# Patient Record
Sex: Female | Born: 1965 | Race: White | Hispanic: No | Marital: Married | Smoking: Current every day smoker
Health system: Southern US, Community
[De-identification: ages and names within clinical notes are randomized; demographics above are authoritative.]

## PROBLEM LIST (undated history)

## (undated) DIAGNOSIS — E785 Hyperlipidemia, unspecified: Secondary | ICD-10-CM

## (undated) DIAGNOSIS — G8929 Other chronic pain: Secondary | ICD-10-CM

## (undated) DIAGNOSIS — I10 Essential (primary) hypertension: Secondary | ICD-10-CM

## (undated) DIAGNOSIS — M549 Dorsalgia, unspecified: Secondary | ICD-10-CM

---

## 2018-07-20 ENCOUNTER — Encounter (HOSPITAL_COMMUNITY): Payer: Self-pay | Admitting: Emergency Medicine

## 2018-07-20 ENCOUNTER — Emergency Department (HOSPITAL_COMMUNITY): Payer: Managed Care, Other (non HMO)

## 2018-07-20 ENCOUNTER — Observation Stay (HOSPITAL_COMMUNITY)
Admission: EM | Admit: 2018-07-20 | Discharge: 2018-07-21 | Disposition: A | Discharge status: Home / Self Care | Payer: Managed Care, Other (non HMO) | Attending: Internal Medicine | Admitting: Internal Medicine

## 2018-07-20 ENCOUNTER — Other Ambulatory Visit: Payer: Self-pay

## 2018-07-20 DIAGNOSIS — G8929 Other chronic pain: Secondary | ICD-10-CM | POA: Diagnosis present

## 2018-07-20 DIAGNOSIS — E876 Hypokalemia: Secondary | ICD-10-CM | POA: Diagnosis not present

## 2018-07-20 DIAGNOSIS — D75839 Thrombocytosis, unspecified: Secondary | ICD-10-CM | POA: Diagnosis present

## 2018-07-20 DIAGNOSIS — Z79891 Long term (current) use of opiate analgesic: Secondary | ICD-10-CM | POA: Insufficient documentation

## 2018-07-20 DIAGNOSIS — D473 Essential (hemorrhagic) thrombocythemia: Secondary | ICD-10-CM | POA: Diagnosis present

## 2018-07-20 DIAGNOSIS — F329 Major depressive disorder, single episode, unspecified: Secondary | ICD-10-CM | POA: Insufficient documentation

## 2018-07-20 DIAGNOSIS — Z791 Long term (current) use of non-steroidal anti-inflammatories (NSAID): Secondary | ICD-10-CM | POA: Diagnosis not present

## 2018-07-20 DIAGNOSIS — R4182 Altered mental status, unspecified: Secondary | ICD-10-CM | POA: Diagnosis not present

## 2018-07-20 DIAGNOSIS — F05 Delirium due to known physiological condition: Secondary | ICD-10-CM

## 2018-07-20 DIAGNOSIS — G92 Toxic encephalopathy: Principal | ICD-10-CM | POA: Insufficient documentation

## 2018-07-20 DIAGNOSIS — F419 Anxiety disorder, unspecified: Secondary | ICD-10-CM | POA: Diagnosis not present

## 2018-07-20 DIAGNOSIS — M549 Dorsalgia, unspecified: Secondary | ICD-10-CM | POA: Insufficient documentation

## 2018-07-20 DIAGNOSIS — T40601A Poisoning by unspecified narcotics, accidental (unintentional), initial encounter: Secondary | ICD-10-CM | POA: Diagnosis not present

## 2018-07-20 DIAGNOSIS — R7989 Other specified abnormal findings of blood chemistry: Secondary | ICD-10-CM | POA: Diagnosis not present

## 2018-07-20 DIAGNOSIS — N179 Acute kidney failure, unspecified: Secondary | ICD-10-CM | POA: Diagnosis present

## 2018-07-20 DIAGNOSIS — I10 Essential (primary) hypertension: Secondary | ICD-10-CM | POA: Diagnosis present

## 2018-07-20 DIAGNOSIS — E785 Hyperlipidemia, unspecified: Secondary | ICD-10-CM | POA: Diagnosis not present

## 2018-07-20 DIAGNOSIS — Z79899 Other long term (current) drug therapy: Secondary | ICD-10-CM | POA: Insufficient documentation

## 2018-07-20 DIAGNOSIS — Z720 Tobacco use: Secondary | ICD-10-CM | POA: Diagnosis not present

## 2018-07-20 HISTORY — DX: Hyperlipidemia, unspecified: E78.5

## 2018-07-20 HISTORY — DX: Other chronic pain: G89.29

## 2018-07-20 HISTORY — DX: Essential (primary) hypertension: I10

## 2018-07-20 HISTORY — DX: Dorsalgia, unspecified: M54.9

## 2018-07-20 LAB — COMPREHENSIVE METABOLIC PANEL
ALT: 13 U/L (ref 0–44)
AST: 18 U/L (ref 15–41)
Albumin: 4.4 g/dL (ref 3.5–5.0)
Alkaline Phosphatase: 107 U/L (ref 38–126)
Anion gap: 12 (ref 5–15)
BUN: 18 mg/dL (ref 6–20)
CO2: 22 mmol/L (ref 22–32)
Calcium: 9.7 mg/dL (ref 8.9–10.3)
Chloride: 104 mmol/L (ref 98–111)
Creatinine, Ser: 2.06 mg/dL — ABNORMAL HIGH (ref 0.44–1.00)
GFR calc Af Amer: 32 mL/min — ABNORMAL LOW (ref >60)
GFR, EST NON AFRICAN AMERICAN: 27 mL/min — AB (ref >60)
Glucose, Bld: 96 mg/dL (ref 70–99)
POTASSIUM: 3.2 mmol/L — AB (ref 3.5–5.1)
Sodium: 138 mmol/L (ref 135–145)
Total Bilirubin: 0.4 mg/dL (ref 0.3–1.2)
Total Protein: 8.3 g/dL — ABNORMAL HIGH (ref 6.5–8.1)

## 2018-07-20 LAB — URINALYSIS, COMPLETE (UACMP) WITH MICROSCOPIC
Glucose, UA: NEGATIVE mg/dL
Hgb urine dipstick: NEGATIVE
Ketones, ur: NEGATIVE mg/dL
Nitrite: NEGATIVE
Protein, ur: 30 mg/dL — AB
SPECIFIC GRAVITY, URINE: 1.027 (ref 1.005–1.030)
pH: 5 (ref 5.0–8.0)

## 2018-07-20 LAB — CBC WITH DIFFERENTIAL/PLATELET
Abs Immature Granulocytes: 0.06 10*3/uL (ref 0.00–0.07)
Abs Immature Granulocytes: 0.03 10*3/uL (ref 0.00–0.07)
BASOS ABS: 0.1 10*3/uL (ref 0.0–0.1)
BASOS PCT: 0 %
Basophils Absolute: 0 10*3/uL (ref 0.0–0.1)
Basophils Relative: 1 %
Eosinophils Absolute: 0 10*3/uL (ref 0.0–0.5)
Eosinophils Absolute: 0.1 10*3/uL (ref 0.0–0.5)
Eosinophils Relative: 0 %
Eosinophils Relative: 1 %
HCT: 38.9 % (ref 36.0–46.0)
HCT: 32.4 % — ABNORMAL LOW (ref 36.0–46.0)
Hemoglobin: 12.3 g/dL (ref 12.0–15.0)
Hemoglobin: 10.3 g/dL — ABNORMAL LOW (ref 12.0–15.0)
IMMATURE GRANULOCYTES: 1 %
Immature Granulocytes: 0 %
Lymphocytes Relative: 22 %
Lymphocytes Relative: 31 %
Lymphs Abs: 2.7 10*3/uL (ref 0.7–4.0)
Lymphs Abs: 3.5 10*3/uL (ref 0.7–4.0)
MCH: 27.8 pg (ref 26.0–34.0)
MCH: 28.1 pg (ref 26.0–34.0)
MCHC: 31.6 g/dL (ref 30.0–36.0)
MCHC: 31.8 g/dL (ref 30.0–36.0)
MCV: 87.8 fL (ref 80.0–100.0)
MCV: 88.5 fL (ref 80.0–100.0)
Monocytes Absolute: 1.2 10*3/uL — ABNORMAL HIGH (ref 0.1–1.0)
Monocytes Absolute: 1.1 10*3/uL — ABNORMAL HIGH (ref 0.1–1.0)
Monocytes Relative: 9 %
Monocytes Relative: 10 %
NRBC: 0 % (ref 0.0–0.2)
Neutro Abs: 8.4 10*3/uL — ABNORMAL HIGH (ref 1.7–7.7)
Neutro Abs: 6.6 10*3/uL (ref 1.7–7.7)
Neutrophils Relative %: 67 %
Neutrophils Relative %: 58 %
Platelets: 588 10*3/uL — ABNORMAL HIGH (ref 150–400)
Platelets: 466 10*3/uL — ABNORMAL HIGH (ref 150–400)
RBC: 4.43 MIL/uL (ref 3.87–5.11)
RBC: 3.66 MIL/uL — ABNORMAL LOW (ref 3.87–5.11)
RDW: 13.3 % (ref 11.5–15.5)
RDW: 13.3 % (ref 11.5–15.5)
WBC: 12.4 10*3/uL — AB (ref 4.0–10.5)
WBC: 11.3 10*3/uL — AB (ref 4.0–10.5)
nRBC: 0 % (ref 0.0–0.2)

## 2018-07-20 LAB — LACTIC ACID, PLASMA
LACTIC ACID, VENOUS: 0.7 mmol/L (ref 0.5–1.9)
Lactic Acid, Venous: 0.9 mmol/L (ref 0.5–1.9)

## 2018-07-20 LAB — MAGNESIUM: Magnesium: 1.6 mg/dL — ABNORMAL LOW (ref 1.7–2.4)

## 2018-07-20 LAB — HCG, SERUM, QUALITATIVE: Preg, Serum: NEGATIVE

## 2018-07-20 LAB — RAPID URINE DRUG SCREEN, HOSP PERFORMED
AMPHETAMINES: NOT DETECTED
BENZODIAZEPINES: NOT DETECTED
Barbiturates: NOT DETECTED
Cocaine: NOT DETECTED
Opiates: POSITIVE — AB
Tetrahydrocannabinol: NOT DETECTED

## 2018-07-20 LAB — INFLUENZA PANEL BY PCR (TYPE A & B)
Influenza A By PCR: NEGATIVE
Influenza B By PCR: NEGATIVE

## 2018-07-20 LAB — TROPONIN I: Troponin I: <0.03 ng/mL (ref <0.03)

## 2018-07-20 LAB — CBG MONITORING, ED: Glucose-Capillary: 101 mg/dL — ABNORMAL HIGH (ref 70–99)

## 2018-07-20 MED ORDER — LORAZEPAM 2 MG/ML IJ SOLN
1.0000 mg | Freq: Once | INTRAMUSCULAR | Status: AC
  Administered 2018-07-20: 1 mg via INTRAVENOUS
  Filled 2018-07-20: qty 1

## 2018-07-20 MED ORDER — ACETAMINOPHEN 325 MG PO TABS: 650.0000 mg | ORAL_TABLET | Freq: Four times a day (QID) | ORAL | Status: DC | PRN

## 2018-07-20 MED ORDER — ACETAMINOPHEN 650 MG RE SUPP: 650.0000 mg | Freq: Four times a day (QID) | RECTAL | Status: DC | PRN

## 2018-07-20 MED ORDER — MAGNESIUM SULFATE 2 GM/50ML IV SOLN
2.0000 g | Freq: Once | INTRAVENOUS | Status: AC
  Administered 2018-07-20: 2 g via INTRAVENOUS
  Filled 2018-07-20: qty 50

## 2018-07-20 MED ORDER — ONDANSETRON HCL 4 MG/2ML IJ SOLN
4.0000 mg | Freq: Once | INTRAMUSCULAR | Status: AC
  Administered 2018-07-20: 4 mg via INTRAVENOUS
  Filled 2018-07-20: qty 2

## 2018-07-20 MED ORDER — HEPARIN SODIUM (PORCINE) 5000 UNIT/ML IJ SOLN
5000.0000 [IU] | Freq: Three times a day (TID) | INTRAMUSCULAR | Status: DC
  Administered 2018-07-20 – 2018-07-21 (×2): 5000 [IU] via SUBCUTANEOUS
  Filled 2018-07-20: qty 1

## 2018-07-20 MED ORDER — ONDANSETRON HCL 4 MG/2ML IJ SOLN: 4.0000 mg | Freq: Four times a day (QID) | INTRAMUSCULAR | Status: DC | PRN

## 2018-07-20 MED ORDER — ONDANSETRON HCL 4 MG PO TABS: 4.0000 mg | ORAL_TABLET | Freq: Four times a day (QID) | ORAL | Status: DC | PRN

## 2018-07-20 MED ORDER — LACTATED RINGERS IV BOLUS
1000.0000 mL | Freq: Once | INTRAVENOUS | Status: AC
  Administered 2018-07-20: 1000 mL via INTRAVENOUS

## 2018-07-20 MED ORDER — SODIUM CHLORIDE 0.9 % IV SOLN
1.0000 g | Freq: Once | INTRAVENOUS | Status: AC
  Administered 2018-07-20: 1 g via INTRAVENOUS
  Filled 2018-07-20: qty 10

## 2018-07-20 MED ORDER — POTASSIUM CHLORIDE 10 MEQ/100ML IV SOLN
10.0000 meq | INTRAVENOUS | Status: DC
  Filled 2018-07-20: qty 100

## 2018-07-20 MED ORDER — POTASSIUM CHLORIDE IN NACL 20-0.9 MEQ/L-% IV SOLN
INTRAVENOUS | Status: DC
  Administered 2018-07-20: 22:00:00 via INTRAVENOUS
  Filled 2018-07-20: qty 1000

## 2018-07-20 NOTE — H&P (Signed)
History and Physical    Allison Berry DVV:[id number] DOB: 06-08-1966 DOA: 07/20/2018  PCP: System, Provider Not In   Patient coming from: Home.  I have personally briefly reviewed patient's old medical records in [place]  Chief Complaint: Altered mental status.  HPI: Allison Berry is a 53 y.o. female with medical history significant of chronic back pain, hyperlipidemia, hypertension, tobacco use who is brought to the emergency department via EMS after her husband found her at home altered.  He states that she has been having nausea, vomiting and diarrhea.  She is somnolent, but wakes up briefly.  She follows simple commands, but does not answer questions.  She is unable to have further history.  Her husband mentioned the last time he saw her at her baseline was yesterday.  He did not see her before going to work in the morning.Her husband states that these symptoms are similar to what she had a couple months ago when she overtook her medications.  However, he is not aware of any recent misuse.  He stated that the pain clinic has been tapering off her total use of opioid analgesics.  ED Course: Initial vital signs temperature 98.2 F, pulse 100, respirations 17, blood pressure 102/66 mmHg and O2 sat 96% on room air.  The patient was given Zofran 4 mg IVP x1, 3000 mL of LR bolus, lorazepam 1 mg IVP x1 and 1 g of Rocephin IVPB.  I added magnesium sulfate 2 g IVPB.  Her urinalysis shows cloudy appearance, small bilirubin, 30 mg/dL of protein, trace leukocyte esterase and rare bacteria microscopic examination.  White count was 12.4, hemoglobin 12.3 g/dL and platelets 588.UDS was positive for opiates.  Serum pregnancy and troponin were negative.  CMP shows a potassium of 3.2 mmol/L.  Creatinine 2.06 mg/dL and total protein 8.3 g/dL.  All other values are within normal limits.  Magnesium was 1.6 mg/dL.  Influenza a and B by PCR was negative.  Lactic acid x2 was negative.  Her CT head was within  normal limits.  Review of Systems: As per HPI otherwise 10 point review of systems negative.   Past Medical History:  Diagnosis Date  . Chronic back pain 07/20/2018  . Dyslipidemia 07/20/2018  . Hypertension 07/20/2018    History reviewed. No pertinent surgical history.   has no history on file for tobacco, alcohol, and drug.  No Known Allergies  Unable to obtain family medical history at this time.  Prior to Admission medications   Medication Sig Start Date End Date Taking? Authorizing Provider  baclofen (LIORESAL) 10 MG tablet Take 10 mg by mouth 2 (two) times daily.   Yes [provider]  DULoxetine (CYMBALTA) 60 MG capsule Take 60 mg by mouth daily.   Yes [provider]  fenofibrate (TRICOR) 145 MG tablet Take 145 mg by mouth daily.   Yes [provider]  ibuprofen (ADVIL,MOTRIN) 800 MG tablet Take 800 mg by mouth every 8 (eight) hours as needed.   Yes [provider]  lisinopril-hydrochlorothiazide (PRINZIDE,ZESTORETIC) 10-12.5 MG tablet Take 1 tablet by mouth daily.   Yes [provider]  oxyCODONE-acetaminophen (PERCOCET) 10-325 MG tablet Take 1 tablet by mouth 4 (four) times daily.    Yes [provider]  potassium chloride SA (K-DUR,KLOR-CON) 20 MEQ tablet Take 20 mEq by mouth daily.   Yes [provider]    Physical Exam: Vitals:   07/20/18 1930 07/20/18 1945 07/20/18 2000 07/20/18 2015  BP: 98/80 (!) 89/54 62/69 Marland Kitchen)  101/54  Pulse: 97 89 92 96  Resp: 13 18 17 18   Temp:      TempSrc:      SpO2: 98% 92% 96% 96%  Weight:      Height:        Constitutional: NAD, calm, comfortable Eyes: PERRL, lids and conjunctivae normal ENMT: Mucous membranes are mildly dry. Posterior pharynx clear of any exudate or lesions. Neck: normal, supple, no masses, no thyromegaly Respiratory: Decreased breath sounds on bases, otherwise clear to auscultation bilaterally, no wheezing, no crackles. Normal respiratory effort. No  accessory muscle use.  Cardiovascular: Regular rate and rhythm, no murmurs / rubs / gallops. No extremity edema. 2+ pedal pulses. No carotid bruits.  Abdomen: Soft, no tenderness, no masses palpated. No hepatosplenomegaly. Bowel sounds positive.  Musculoskeletal: no clubbing / cyanosis. Good ROM, no contractures. Normal muscle tone.  Skin: no rashes, lesions, ulcers on limited dermatological examination. Neurologic: CN 2-12 grossly intact. Sensation intact, DTR normal.  Generalized weakness, but moves all extremities. Psychiatric: Confused.  Alert and oriented to name only.  Labs on Admission: I have personally reviewed following labs and imaging studies  CBC: Recent Labs  Lab 07/20/18 1500  WBC 12.4*  NEUTROABS 8.4*  HGB 12.3  HCT 38.9  MCV 87.8  PLT 353*   Basic Metabolic Panel: Recent Labs  Lab 07/20/18 1500 07/20/18 1745  NA 138  --   K 3.2*  --   CL 104  --   CO2 22  --   GLUCOSE 96  --   BUN 18  --   CREATININE 2.06*  --   CALCIUM 9.7  --   MG  --  1.6*   GFR: Estimated Creatinine Clearance: 30.6 mL/min (A) (by C-G formula based on SCr of 2.06 mg/dL (H)). Liver Function Tests: Recent Labs  Lab 07/20/18 1500  AST 18  ALT 13  ALKPHOS 107  BILITOT 0.4  PROT 8.3*  ALBUMIN 4.4   No results for input(s): LIPASE, AMYLASE in the last 168 hours. No results for input(s): AMMONIA in the last 168 hours. Coagulation Profile: No results for input(s): INR, PROTIME in the last 168 hours. Cardiac Enzymes: Recent Labs  Lab 07/20/18 1500  TROPONINI <0.03   BNP (last 3 results) No results for input(s): PROBNP in the last 8760 hours. HbA1C: No results for input(s): HGBA1C in the last 72 hours. CBG: Recent Labs  Lab 07/20/18 1419  GLUCAP 101*   Lipid Profile: No results for input(s): CHOL, HDL, LDLCALC, TRIG, CHOLHDL, LDLDIRECT in the last 72 hours. Thyroid Function Tests: No results for input(s): TSH, T4TOTAL, FREET4, T3FREE, THYROIDAB in the last 72  hours. Anemia Panel: No results for input(s): VITAMINB12, FOLATE, FERRITIN, TIBC, IRON, RETICCTPCT in the last 72 hours. Urine analysis:    Component Value Date/Time   COLORURINE AMBER (A) 07/20/2018 1413   APPEARANCEUR CLOUDY (A) 07/20/2018 1413   LABSPEC 1.027 07/20/2018 1413   PHURINE 5.0 07/20/2018 1413   GLUCOSEU NEGATIVE 07/20/2018 1413   HGBUR NEGATIVE 07/20/2018 1413   BILIRUBINUR SMALL (A) 07/20/2018 1413   KETONESUR NEGATIVE 07/20/2018 1413   PROTEINUR 30 (A) 07/20/2018 1413   NITRITE NEGATIVE 07/20/2018 1413   LEUKOCYTESUR TRACE (A) 07/20/2018 1413    Radiological Exams on Admission: Ct Head Wo Contrast  Result Date: 07/20/2018 CLINICAL DATA:  Altered mental status with nausea, vomiting and diarrhea. EXAM: CT HEAD WITHOUT CONTRAST TECHNIQUE: Contiguous axial images were obtained from the base of the skull through the vertex without intravenous contrast.  COMPARISON:  None. FINDINGS: Brain: No evidence of acute infarction, hemorrhage, hydrocephalus, extra-axial collection or mass lesion/mass effect. Vascular: No hyperdense vessel or unexpected calcification. Skull: Normal. Negative for fracture or focal lesion. Sinuses/Orbits: Globes and orbits are unremarkable. Visualized sinuses and mastoid air cells are clear. Other: None. IMPRESSION: Normal unenhanced CT scan of the brain. Electronically Signed   By: Lajean Manes M.D.   On: 07/20/2018 14:57    EKG: Independently reviewed. Vent. rate 84 BPM PR interval * ms QRS duration 90 ms QT/QTc 379/448 ms P-R-T axes 57 73 63 Sinus rhythm Abnormal R-wave progression, early transition  Assessment/Plan Principal Problem:   Altered mental status Observation/stepdown.. Continue supplemental oxygen. Continue IV fluids. Neuro checks every 4 hours. Hold sedating medications.  Active Problems:   AKI (acute kidney injury) ([place]) Continue IV fluids. Monitor intake and output. Follow-up BMP.    Hypokalemia Replacing with IV  fluids. Follow-up potassium level.    Hypomagnesemia Replacing. Follow-up magnesium level as needed.    Thrombocytosis Likely due to hemoconcentration. Follow-up platelet level.    Tobacco use Nicotine replacement therapy ordered. Nurse to provide tobacco cessation information.    Hypertension Hold Prinzide. Follow-up BP and renal function.    Chronic back pain Hold opioids and muscle relaxant.    Dyslipidemia Resume TriCor once the patient is more alert.     DVT prophylaxis: Heparin SQ. Code Status: Full code. Family Communication: Her husband was in the ED room and provide a history. Disposition Plan: Observation for IV hydration and close cardio monitoring. Consults called:  Admission status: Observation/SDU.   Reubin Milan MD [facility]  If 7PM-7AM, please contact night-coverage [email or web address] Password [email or web address]  07/20/2018, 8:56 PM

## 2018-07-20 NOTE — ED Provider Notes (Signed)
Emergency Department Provider Note   I have reviewed the triage vital signs and the nursing notes.   HISTORY  Chief Complaint Altered Mental Status   HPI Allison Berry is a 53 y.o. female who is here reportedly for altered mental status.  Patient not able to give me any history although she does keep repeating "no day".  She goes asleep shortly after that.  Not able to offer PMH/PSH/any other history. No family at bedside.  No other associated or modifying symptoms.   LeveL V Caveat 2/2 Altered Mental Status  History reviewed. No pertinent past medical history.  There are no active problems to display for this patient.   Allergies Patient has no known allergies.  No family history on file.  Social History Social History   Tobacco Use  . Smoking status: Not on file  Substance Use Topics  . Alcohol use: Not on file  . Drug use: Not on file    Review of Systems  All other systems negative except as documented in the HPI. All pertinent positives and negatives as reviewed in the HPI. ____________________________________________   PHYSICAL EXAM:  VITAL SIGNS: ED Triage Vitals  Enc Vitals Group     BP 07/20/18 1403 (!) 200/162     Pulse Rate 07/20/18 1403 100     Resp 07/20/18 1400 17     Temp 07/20/18 1403 98.2 F (36.8 C)     Temp Source 07/20/18 1403 Axillary     SpO2 07/20/18 1403 96 %     Weight 07/20/18 1402 150 lb (68 kg)     Height 07/20/18 1402 5\' 4"  (1.626 m)    Constitutional: sleepy. Wakes to pain. Eyes: Conjunctivae are normal. PERRL. EOMI. Head: Atraumatic. Nose: No congestion/rhinnorhea. Mouth/Throat: Mucous membranes are dry.  Oropharynx non-erythematous. Neck: No stridor.  No meningeal signs.   Cardiovascular: Normal rate, regular rhythm. Good peripheral circulation. Grossly normal heart sounds.   Respiratory: Normal respiratory effort.  No retractions. Lungs CTAB. Gastrointestinal: Soft and nontender. No distention.    Musculoskeletal: No lower extremity tenderness nor edema. No gross deformities of extremities. Neurologic:  Only says 'No Day'. No gross focal neurologic deficits are appreciated.  Skin:  Skin is warm, dry and intact. No rash noted.   ____________________________________________   LABS (all labs ordered are listed, but only abnormal results are displayed)  Labs Reviewed  CBG MONITORING, ED - Abnormal; Notable for the following components:      Result Value   Glucose-Capillary 101 (*)    All other components within normal limits  COMPREHENSIVE METABOLIC PANEL  CBC WITH DIFFERENTIAL/PLATELET  URINALYSIS, COMPLETE (UACMP) WITH MICROSCOPIC  TROPONIN I  I-STAT BETA HCG BLOOD, ED ([facility], [facility], [facility] ONLY)   ____________________________________________  EKG   EKG Interpretation  Date/Time:  Thursday July 20 2018 14:15:15 EST Ventricular Rate:  84 PR Interval:    QRS Duration: 90 QT Interval:  379 QTC Calculation: 448 R Axis:   73 Text Interpretation:  Sinus rhythm Abnormal R-wave progression, early transition No old tracing to compare No acute changes Confirmed by Merrily Pew [phone]) on 07/20/2018 2:42:41 PM       ____________________________________________  RADIOLOGY  No results found.  ____________________________________________   PROCEDURES  Procedure(s) performed:   Procedures   ____________________________________________   INITIAL IMPRESSION / ASSESSMENT AND PLAN / ED COURSE  Per nursing, last known normal around 0700 this morning. If this is a stroke, she is outside the window for tPA. Moves all extremities, could be  metabolic. BP high, may be PRES. Will eval for above.   Subsequent BP's lower than first one, it must have been an error, repleted with fluids but patient persistently altered. Husband gave more history that she sometimes abuses pills and this could be related to that. Will d/w medicine re: observation for improved MS.     Pertinent labs &  imaging results that were available during my care of the patient were reviewed by me and considered in my medical decision making (see chart for details).  ____________________________________________  FINAL CLINICAL IMPRESSION(S) / ED DIAGNOSES  Final diagnoses:  None     MEDICATIONS GIVEN DURING THIS VISIT:  Medications  ondansetron (ZOFRAN) injection 4 mg (has no administration in time range)     NEW OUTPATIENT MEDICATIONS STARTED DURING THIS VISIT:  New Prescriptions   No medications on file    Note:  This note was prepared with assistance of Dragon voice recognition software. Occasional wrong-word or sound-a-like substitutions may have occurred due to the inherent limitations of voice recognition software.   Fisher Hargadon, Corene Cornea, MD 07/24/18 [phone]

## 2018-07-20 NOTE — ED Triage Notes (Signed)
Per EMS patient from home. Pts husband called EMS. EMS states patient has been vomiting with diarrhea. Patient awake in triage but unable to follow commands.

## 2018-07-21 ENCOUNTER — Encounter (HOSPITAL_COMMUNITY): Payer: Self-pay

## 2018-07-21 DIAGNOSIS — D75839 Thrombocytosis, unspecified: Secondary | ICD-10-CM | POA: Diagnosis present

## 2018-07-21 DIAGNOSIS — R4182 Altered mental status, unspecified: Secondary | ICD-10-CM | POA: Diagnosis not present

## 2018-07-21 DIAGNOSIS — D473 Essential (hemorrhagic) thrombocythemia: Secondary | ICD-10-CM | POA: Diagnosis present

## 2018-07-21 LAB — BASIC METABOLIC PANEL
Anion gap: 10 (ref 5–15)
BUN: 12 mg/dL (ref 6–20)
CO2: 21 mmol/L — ABNORMAL LOW (ref 22–32)
Calcium: 8.7 mg/dL — ABNORMAL LOW (ref 8.9–10.3)
Chloride: 110 mmol/L (ref 98–111)
Creatinine, Ser: 0.85 mg/dL (ref 0.44–1.00)
GFR calc Af Amer: >60 mL/min (ref >60)
GFR calc non Af Amer: >60 mL/min (ref >60)
Glucose, Bld: 87 mg/dL (ref 70–99)
Potassium: 3.2 mmol/L — ABNORMAL LOW (ref 3.5–5.1)
Sodium: 141 mmol/L (ref 135–145)

## 2018-07-21 LAB — MRSA PCR SCREENING: MRSA by PCR: NEGATIVE

## 2018-07-21 MED ORDER — NICOTINE 21 MG/24HR TD PT24: 21.0000 mg | MEDICATED_PATCH | Freq: Every day | TRANSDERMAL | Status: DC | PRN

## 2018-07-21 MED ORDER — POTASSIUM CHLORIDE CRYS ER 20 MEQ PO TBCR
40.0000 meq | EXTENDED_RELEASE_TABLET | Freq: Once | ORAL | Status: AC
  Administered 2018-07-21: 40 meq via ORAL
  Filled 2018-07-21: qty 2

## 2018-07-21 MED ORDER — LORAZEPAM 2 MG/ML IJ SOLN: 0.5000 mg | INTRAMUSCULAR | Status: DC | PRN

## 2018-07-21 MED ORDER — LACTATED RINGERS IV SOLN
INTRAVENOUS | Status: DC
  Administered 2018-07-21: 08:00:00 via INTRAVENOUS

## 2018-07-21 MED ORDER — BISACODYL 10 MG RE SUPP
10.0000 mg | Freq: Once | RECTAL | Status: AC
  Administered 2018-07-21: 10 mg via RECTAL
  Filled 2018-07-21: qty 1

## 2018-07-21 MED ORDER — LORAZEPAM 2 MG/ML IJ SOLN
1.0000 mg | Freq: Once | INTRAMUSCULAR | Status: AC
  Administered 2018-07-21: 1 mg via INTRAVENOUS
  Filled 2018-07-21: qty 1

## 2018-07-21 NOTE — Clinical Social Work Note (Signed)
Met with patient and gave addiction resources per Dr request.  Pt is ambivalent about change, motivation low.

## 2018-07-21 NOTE — Progress Notes (Signed)
PROGRESS NOTE    Allison Berry  HTD:[id number] DOB: August 25, 1965 DOA: 07/20/2018 PCP: System, Provider Not In   Brief Narrative:  Per HPI: Allison Berry is a 53 y.o. female with medical history significant of chronic back pain, hyperlipidemia, hypertension, tobacco use who is brought to the emergency department via EMS after her husband found her at home altered.  He states that she has been having nausea, vomiting and diarrhea.  She is somnolent, but wakes up briefly.  She follows simple commands, but does not answer questions.  She is unable to have further history.  Her husband mentioned the last time he saw her at her baseline was yesterday.  He did not see her before going to work in the morning.Her husband states that these symptoms are similar to what she had a couple months ago when she overtook her medications.  However, he is not aware of any recent misuse.  He stated that the pain clinic has been tapering off her total use of opioid analgesics.  Patient was admitted with acute metabolic encephalopathy due to suspected overuse of her narcotic medications in the face of AKI.  Assessment & Plan:   Principal Problem:   Altered mental status Active Problems:   AKI (acute kidney injury) ([place])   Hypertension   Chronic back pain   Dyslipidemia   Hypokalemia   Hypomagnesemia   Hypokalemia   Thrombocytosis ([facility])  1. Acute metabolic/toxic encephalopathy.  This appears to be a combination of likely medication overdose with narcotics and baclofen in the setting of AKI.  It appears that patient may have been weaned from her medications fairly quickly began to have opioid withdrawal symptoms to the point where she likely overdosed creating her symptomatology.  Continue to monitor off of these medications and maintain on IV fluid.  Appropriate for transfer to general medical floor at this time. 2. AKI-likely secondary to dehydration.  Patient did have some diarrhea, nausea and vomiting likely  secondary to opioid withdrawal symptoms.  Continue on IV fluid and monitor I's and O's and follow-up BMP in a.m.  Already appears to be improving with hydration. 3. Hypokalemia-likely secondary to diarrhea.  Was receiving IV fluid replacement.  Will maintain on oral replacement at this time and recheck labs in a.m. along with magnesium which was also low. 4. Chronic back pain.  Hold Percocet and baclofen.  Hold ibuprofen due to AKI. 5. Depression/anxiety.  Continue to hold Cymbalta at this time.  Will reinitiate on discharge once improved. 6. Dyslipidemia.  Continue on TriCor. 7. Hypertension.  Continue to hold lisinopril/HCTZ due to soft blood pressure readings and AKI.  Will reinitiate on discharge once improved.   DVT prophylaxis: Heparin Code Status: Full Family Communication: None currently at bedside Disposition Plan: Transfer to floor today and continue monitor with electrolyte replacement.  Anticipate discharge in next 24 hours if she continues to remain improved.   Consultants:   None  Procedures:   None  Antimicrobials:   Rocephin 1/2-1/2   Subjective: Patient seen and evaluated today with no new acute complaints or concerns. No acute concerns or events noted overnight.  She complains of some constipation and dry mouth this morning, but is more awake and alert otherwise.  She still appears confused.  Objective: Vitals:   07/21/18 0300 07/21/18 0400 07/21/18 0500 07/21/18 0600  BP: (!) 96/59 (!) 98/59 (!) 104/57 (!) 104/54  Pulse: 90 85 93 90  Resp: 15 17 16 18   Temp:  98.3 F (36.8 C)  TempSrc:  Axillary    SpO2: 95% 97% 99% 97%  Weight:   66.3 kg   Height:        Intake/Output Summary (Last 24 hours) at 07/21/2018 0729 Last data filed at 07/21/2018 0500 Gross per 24 hour  Intake 3607.43 ml  Output 300 ml  Net 3307.43 ml   Filed Weights   07/20/18 1402 07/20/18 2352 07/21/18 0500  Weight: 68 kg 66.3 kg 66.3 kg    Examination:  General exam: Appears  calm and comfortable, overall confused and disoriented. Respiratory system: Clear to auscultation. Respiratory effort normal. Cardiovascular system: S1 & S2 heard, RRR. No JVD, murmurs, rubs, gallops or clicks. No pedal edema. Gastrointestinal system: Abdomen is nondistended, soft and nontender. No organomegaly or masses felt. Normal bowel sounds heard. Central nervous system: Alert and awake Extremities: Symmetric 5 x 5 power. Skin: No rashes, lesions or ulcers Psychiatry: Cannot fully assess at this time.    Data Reviewed: I have personally reviewed following labs and imaging studies  CBC: Recent Labs  Lab 07/20/18 1500 07/20/18 2056  WBC 12.4* 11.3*  NEUTROABS 8.4* 6.6  HGB 12.3 10.3*  HCT 38.9 32.4*  MCV 87.8 88.5  PLT 588* 623*   Basic Metabolic Panel: Recent Labs  Lab 07/20/18 1500 07/20/18 1745 07/21/18 0425  NA 138  --  141  K 3.2*  --  3.2*  CL 104  --  110  CO2 22  --  21*  GLUCOSE 96  --  87  BUN 18  --  12  CREATININE 2.06*  --  0.85  CALCIUM 9.7  --  8.7*  MG  --  1.6*  --    GFR: Estimated Creatinine Clearance: 73.3 mL/min (by C-G formula based on SCr of 0.85 mg/dL). Liver Function Tests: Recent Labs  Lab 07/20/18 1500  AST 18  ALT 13  ALKPHOS 107  BILITOT 0.4  PROT 8.3*  ALBUMIN 4.4   No results for input(s): LIPASE, AMYLASE in the last 168 hours. No results for input(s): AMMONIA in the last 168 hours. Coagulation Profile: No results for input(s): INR, PROTIME in the last 168 hours. Cardiac Enzymes: Recent Labs  Lab 07/20/18 1500  TROPONINI <0.03   BNP (last 3 results) No results for input(s): PROBNP in the last 8760 hours. HbA1C: No results for input(s): HGBA1C in the last 72 hours. CBG: Recent Labs  Lab 07/20/18 1419  GLUCAP 101*   Lipid Profile: No results for input(s): CHOL, HDL, LDLCALC, TRIG, CHOLHDL, LDLDIRECT in the last 72 hours. Thyroid Function Tests: No results for input(s): TSH, T4TOTAL, FREET4, T3FREE, THYROIDAB  in the last 72 hours. Anemia Panel: No results for input(s): VITAMINB12, FOLATE, FERRITIN, TIBC, IRON, RETICCTPCT in the last 72 hours. Sepsis Labs: Recent Labs  Lab 07/20/18 1745 07/20/18 1953  LATICACIDVEN 0.9 0.7    Recent Results (from the past 240 hour(s))  MRSA PCR Screening     Status: None   Collection Time: 07/20/18 11:24 PM  Result Value Ref Range Status   MRSA by PCR NEGATIVE NEGATIVE Final    Comment:        The GeneXpert MRSA Assay (FDA approved for NASAL specimens only), is one component of a comprehensive MRSA colonization surveillance program. It is not intended to diagnose MRSA infection nor to guide or monitor treatment for MRSA infections. Performed at [facility], [address], [place], [place] [address]          Radiology Studies: Ct Head Wo Contrast  Result Date: 07/20/2018 CLINICAL DATA:  Altered mental status with nausea, vomiting and diarrhea. EXAM: CT HEAD WITHOUT CONTRAST TECHNIQUE: Contiguous axial images were obtained from the base of the skull through the vertex without intravenous contrast. COMPARISON:  None. FINDINGS: Brain: No evidence of acute infarction, hemorrhage, hydrocephalus, extra-axial collection or mass lesion/mass effect. Vascular: No hyperdense vessel or unexpected calcification. Skull: Normal. Negative for fracture or focal lesion. Sinuses/Orbits: Globes and orbits are unremarkable. Visualized sinuses and mastoid air cells are clear. Other: None. IMPRESSION: Normal unenhanced CT scan of the brain. Electronically Signed   By: Lajean Manes M.D.   On: 07/20/2018 14:57        Scheduled Meds: . bisacodyl  10 mg Rectal Once  . heparin  5,000 Units Subcutaneous Q8H  . potassium chloride  40 mEq Oral Once   Continuous Infusions: . lactated ringers       LOS: 0 days    Time spent: 30 minutes    Sharece Fleischhacker Darleen Crocker, DO [facility] Pager [phone]  If 7PM-7AM, please contact  night-coverage [email or web address] Password [facility] 07/21/2018, 7:29 AM

## 2018-07-21 NOTE — Progress Notes (Addendum)
Pt is awake and disoriented. She is intermittently tearful but cannot describe why. She states that she doesn't know why she is so restless She denies pain. She denies any attempt to harm herself prior to arrival in the ER.  Currently she is restless but can converse a small amount although usually inappropriate responses.Marland Kitchen

## 2018-07-21 NOTE — Progress Notes (Signed)
Patient is extremely restless and refuses to answer some questions. She cannot answer what medications she takes or has/has not taken. She says "no"when asked if she remembers the events preceding her admission.

## 2018-07-21 NOTE — Progress Notes (Signed)
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 This RN spoke with the patient about the possibility of her having taken too much medication, to which the patient adamantly states that she does not take more than what she is prescribed.   MD made aware of this conversation.  Celestia Khat, RN

## 2018-07-21 NOTE — Progress Notes (Signed)
Patient is to be discharged home and in stable condition. Patient's IV removed, WNL. Patient and husband given discharge instructions and verbalized understanding. Patient given addiction resources by [facility] [employer] with CSW. Patient accompanied to awaiting transportation by staff via wheelchair.  Celestia Khat, RN

## 2018-07-21 NOTE — Discharge Summary (Signed)
Physician Discharge Summary  Allison Berry OBS:[id number] DOB: [birth year] DOA: 07/20/2018  PCP: System, Provider Not In  Admit date: 07/20/2018  Discharge date: 07/21/2018  Admitted From:Home  Disposition:  Home  Recommendations for Outpatient Follow-up:  1. Follow up with PCP in 1-2 weeks 2. Continue medications as prescribed 3. Patient has been given information on addiction counseling/rehabilitation centers prior to discharge  Home Health: None  Equipment/Devices: None  Discharge Condition: Stable  CODE STATUS: Full  Diet recommendation: Heart Healthy  Brief/Interim Summary: Per HPI: Allison Berry a 53 y.o.femalewith medical history significant ofchronic back pain, hyperlipidemia, hypertension, tobacco use who is brought to the emergency department via EMS after her husband found her at home altered. He states that she has been having nausea, vomiting and diarrhea. She is somnolent, but wakes up briefly. She follows simple commands, but does not answer questions. She is unable to have further history. Her husband mentioned the last time he saw her at her baseline was yesterday. He did not see her before going to work in the morning.Her husband states that thesesymptoms are similar to what she had a couple months ago when she overtook her medications. However, he is not aware of any recent misuse. He stated that the pain clinic has been tapering off her total use of opioid analgesics.  Patient was admitted with acute metabolic encephalopathy secondary to overdose on her narcotic medications due to "excessive pain."  She denies any suicidal intention or intends to harm herself.  She did have some AKI which has now resolved with IV fluid administration.  She is now back to her usual functioning baseline per husband at baseline and is eating well and did have a bowel movement.  She is agreeable to attend some addiction counseling and understands that she has a problem with  overusing her pain medications.  She will not get any further prescriptions for this on discharge and will need to contact her pain management specialist.  No other acute events noted during the course of this admission.  Discharge Diagnoses:  Principal Problem:   Altered mental status Active Problems:   AKI (acute kidney injury) ([place])   Hypertension   Chronic back pain   Dyslipidemia   Hypokalemia   Hypomagnesemia   Hypokalemia   Thrombocytosis (HCC)  Principal discharge diagnosis: Acute toxic encephalopathy secondary to overdose of narcotic medications and AKI.  Discharge Instructions  Discharge Instructions    Diet - low sodium heart healthy   Complete by:  As directed    Increase activity slowly   Complete by:  As directed      Allergies as of 07/21/2018   No Known Allergies     Medication List    TAKE these medications   baclofen 10 MG tablet Commonly known as:  LIORESAL Take 10 mg by mouth 2 (two) times daily.   DULoxetine 60 MG capsule Commonly known as:  CYMBALTA Take 60 mg by mouth daily.   fenofibrate 145 MG tablet Commonly known as:  TRICOR Take 145 mg by mouth daily.   ibuprofen 800 MG tablet Commonly known as:  ADVIL,MOTRIN Take 800 mg by mouth every 8 (eight) hours as needed.   lisinopril-hydrochlorothiazide 10-12.5 MG tablet Commonly known as:  PRINZIDE,ZESTORETIC Take 1 tablet by mouth daily.   oxyCODONE-acetaminophen 10-325 MG tablet Commonly known as:  PERCOCET Take 1 tablet by mouth 4 (four) times daily.   potassium chloride SA 20 MEQ tablet Commonly known as:  K-DUR,KLOR-CON Take 20 mEq by mouth daily.  Follow-up Information    pcp and pain management. Schedule an appointment as soon as possible for a visit in 1 week(s).          No Known Allergies  Consultations:  None   Procedures/Studies: Ct Head Wo Contrast  Result Date: 07/20/2018 CLINICAL DATA:  Altered mental status with nausea, vomiting and diarrhea. EXAM: CT  HEAD WITHOUT CONTRAST TECHNIQUE: Contiguous axial images were obtained from the base of the skull through the vertex without intravenous contrast. COMPARISON:  None. FINDINGS: Brain: No evidence of acute infarction, hemorrhage, hydrocephalus, extra-axial collection or mass lesion/mass effect. Vascular: No hyperdense vessel or unexpected calcification. Skull: Normal. Negative for fracture or focal lesion. Sinuses/Orbits: Globes and orbits are unremarkable. Visualized sinuses and mastoid air cells are clear. Other: None. IMPRESSION: Normal unenhanced CT scan of the brain. Electronically Signed   By: Lajean Manes M.D.   On: 07/20/2018 14:57     Discharge Exam: Vitals:   07/21/18 1000 07/21/18 1100  BP: (!) 97/58 101/67  Pulse: (!) 107 93  Resp: 17 16  Temp:    SpO2: 90% 96%   Vitals:   07/21/18 0800 07/21/18 0900 07/21/18 1000 07/21/18 1100  BP:  98/81 (!) 97/58 101/67  Pulse:  (!) 101 (!) 107 93  Resp:  19 17 16   Temp: 98.4 F (36.9 C)     TempSrc: Oral     SpO2:  95% 90% 96%  Weight:      Height:        General: Pt is alert, awake, not in acute distress Cardiovascular: RRR, S1/S2 +, no rubs, no gallops Respiratory: CTA bilaterally, no wheezing, no rhonchi Abdominal: Soft, NT, ND, bowel sounds + Extremities: no edema, no cyanosis    The results of significant diagnostics from this hospitalization (including imaging, microbiology, ancillary and laboratory) are listed below for reference.     Microbiology: Recent Results (from the past 240 hour(s))  MRSA PCR Screening     Status: None   Collection Time: 07/20/18 11:24 PM  Result Value Ref Range Status   MRSA by PCR NEGATIVE NEGATIVE Final    Comment:        The GeneXpert MRSA Assay (FDA approved for NASAL specimens only), is one component of a comprehensive MRSA colonization surveillance program. It is not intended to diagnose MRSA infection nor to guide or monitor treatment for MRSA infections. Performed at [facility], [address], [place], [place] [address]      Labs: BNP (last 3 results) No results for input(s): BNP in the last 8760 hours. Basic Metabolic Panel: Recent Labs  Lab 07/20/18 1500 07/20/18 1745 07/21/18 0425  NA 138  --  141  K 3.2*  --  3.2*  CL 104  --  110  CO2 22  --  21*  GLUCOSE 96  --  87  BUN 18  --  12  CREATININE 2.06*  --  0.85  CALCIUM 9.7  --  8.7*  MG  --  1.6*  --    Liver Function Tests: Recent Labs  Lab 07/20/18 1500  AST 18  ALT 13  ALKPHOS 107  BILITOT 0.4  PROT 8.3*  ALBUMIN 4.4   No results for input(s): LIPASE, AMYLASE in the last 168 hours. No results for input(s): AMMONIA in the last 168 hours. CBC: Recent Labs  Lab 07/20/18 1500 07/20/18 2056  WBC 12.4* 11.3*  NEUTROABS 8.4* 6.6  HGB 12.3 10.3*  HCT 38.9 32.4*  MCV 87.8 88.5  PLT 588* 466*   Cardiac Enzymes: Recent Labs  Lab 07/20/18 1500  TROPONINI <0.03   BNP: Invalid input(s): POCBNP CBG: Recent Labs  Lab 07/20/18 1419  GLUCAP 101*   D-Dimer No results for input(s): DDIMER in the last 72 hours. Hgb A1c No results for input(s): HGBA1C in the last 72 hours. Lipid Profile No results for input(s): CHOL, HDL, LDLCALC, TRIG, CHOLHDL, LDLDIRECT in the last 72 hours. Thyroid function studies No results for input(s): TSH, T4TOTAL, T3FREE, THYROIDAB in the last 72 hours.  Invalid input(s): FREET3 Anemia work up No results for input(s): VITAMINB12, FOLATE, FERRITIN, TIBC, IRON, RETICCTPCT in the last 72 hours. Urinalysis    Component Value Date/Time   COLORURINE AMBER (A) 07/20/2018 1413   APPEARANCEUR CLOUDY (A) 07/20/2018 1413   LABSPEC 1.027 07/20/2018 1413   PHURINE 5.0 07/20/2018 1413   GLUCOSEU NEGATIVE 07/20/2018 1413   HGBUR NEGATIVE 07/20/2018 1413   BILIRUBINUR SMALL (A) 07/20/2018 1413   KETONESUR NEGATIVE 07/20/2018 1413   PROTEINUR 30 (A) 07/20/2018 1413   NITRITE NEGATIVE 07/20/2018 1413   LEUKOCYTESUR TRACE (A) 07/20/2018 1413   Sepsis  Labs Invalid input(s): PROCALCITONIN,  WBC,  LACTICIDVEN Microbiology Recent Results (from the past 240 hour(s))  MRSA PCR Screening     Status: None   Collection Time: 07/20/18 11:24 PM  Result Value Ref Range Status   MRSA by PCR NEGATIVE NEGATIVE Final    Comment:        The GeneXpert MRSA Assay (FDA approved for NASAL specimens only), is one component of a comprehensive MRSA colonization surveillance program. It is not intended to diagnose MRSA infection nor to guide or monitor treatment for MRSA infections. Performed at [facility], [address] [place], [place] [address]      Time coordinating discharge: 35 minutes  SIGNED:   Rodena Goldmann, DO [facility] 07/21/2018, 11:18 AM Pager [phone]  If 7PM-7AM, please contact night-coverage [email or web address] Password [email or web address]

## 2018-07-22 LAB — HIV ANTIBODY (ROUTINE TESTING W REFLEX): HIV Screen 4th Generation wRfx: NONREACTIVE

## 2020-10-12 ENCOUNTER — Other Ambulatory Visit: Payer: Self-pay

## 2020-10-12 ENCOUNTER — Inpatient Hospital Stay (HOSPITAL_COMMUNITY)
Admission: EM | Admit: 2020-10-12 | Discharge: 2020-10-19 | DRG: 522 | Disposition: A | Discharge status: Home Health | Payer: Medicaid Other | Attending: Internal Medicine | Admitting: Internal Medicine

## 2020-10-12 ENCOUNTER — Encounter (HOSPITAL_COMMUNITY): Payer: Self-pay

## 2020-10-12 ENCOUNTER — Emergency Department (HOSPITAL_COMMUNITY): Payer: Medicaid Other

## 2020-10-12 DIAGNOSIS — W19XXXA Unspecified fall, initial encounter: Secondary | ICD-10-CM

## 2020-10-12 DIAGNOSIS — M549 Dorsalgia, unspecified: Secondary | ICD-10-CM | POA: Diagnosis present

## 2020-10-12 DIAGNOSIS — F419 Anxiety disorder, unspecified: Secondary | ICD-10-CM | POA: Diagnosis present

## 2020-10-12 DIAGNOSIS — D75839 Thrombocytosis, unspecified: Secondary | ICD-10-CM | POA: Diagnosis present

## 2020-10-12 DIAGNOSIS — I10 Essential (primary) hypertension: Secondary | ICD-10-CM | POA: Diagnosis present

## 2020-10-12 DIAGNOSIS — F32A Depression, unspecified: Secondary | ICD-10-CM | POA: Diagnosis present

## 2020-10-12 DIAGNOSIS — D62 Acute posthemorrhagic anemia: Secondary | ICD-10-CM | POA: Diagnosis not present

## 2020-10-12 DIAGNOSIS — S72002A Fracture of unspecified part of neck of left femur, initial encounter for closed fracture: Secondary | ICD-10-CM

## 2020-10-12 DIAGNOSIS — F17210 Nicotine dependence, cigarettes, uncomplicated: Secondary | ICD-10-CM | POA: Diagnosis present

## 2020-10-12 DIAGNOSIS — Z96649 Presence of unspecified artificial hip joint: Secondary | ICD-10-CM

## 2020-10-12 DIAGNOSIS — W010XXA Fall on same level from slipping, tripping and stumbling without subsequent striking against object, initial encounter: Secondary | ICD-10-CM | POA: Diagnosis present

## 2020-10-12 DIAGNOSIS — Z79899 Other long term (current) drug therapy: Secondary | ICD-10-CM

## 2020-10-12 DIAGNOSIS — Y92019 Unspecified place in single-family (private) house as the place of occurrence of the external cause: Secondary | ICD-10-CM

## 2020-10-12 DIAGNOSIS — E876 Hypokalemia: Secondary | ICD-10-CM | POA: Diagnosis present

## 2020-10-12 DIAGNOSIS — Z419 Encounter for procedure for purposes other than remedying health state, unspecified: Secondary | ICD-10-CM

## 2020-10-12 DIAGNOSIS — Z79891 Long term (current) use of opiate analgesic: Secondary | ICD-10-CM

## 2020-10-12 DIAGNOSIS — Z20822 Contact with and (suspected) exposure to covid-19: Secondary | ICD-10-CM | POA: Diagnosis present

## 2020-10-12 DIAGNOSIS — S72012A Unspecified intracapsular fracture of left femur, initial encounter for closed fracture: Principal | ICD-10-CM | POA: Diagnosis present

## 2020-10-12 DIAGNOSIS — D72829 Elevated white blood cell count, unspecified: Secondary | ICD-10-CM

## 2020-10-12 DIAGNOSIS — E785 Hyperlipidemia, unspecified: Secondary | ICD-10-CM | POA: Diagnosis present

## 2020-10-12 DIAGNOSIS — G8929 Other chronic pain: Secondary | ICD-10-CM | POA: Diagnosis present

## 2020-10-12 DIAGNOSIS — D649 Anemia, unspecified: Secondary | ICD-10-CM

## 2020-10-12 MED ORDER — OXYCODONE-ACETAMINOPHEN 5-325 MG PO TABS
1.0000 | ORAL_TABLET | Freq: Once | ORAL | Status: AC
  Administered 2020-10-12: 1 via ORAL
  Filled 2020-10-12: qty 1

## 2020-10-12 NOTE — ED Triage Notes (Signed)
BIB EMS after mechanical fall Saturday morning. C/o L hip pain at this time.

## 2020-10-12 NOTE — ED Provider Notes (Addendum)
[facility] EMERGENCY DEPARTMENT Provider Note   CSN: [id number] Arrival date & time: 10/12/20  2319     History Chief Complaint  Patient presents with  . Fall    Allison Berry is a 55 y.o. female.  Patient presents to the emergency department for evaluation of injuries after a fall.  Patient reports falling onto her left side, injuring her left pelvic area.  She reports that the fall occurred yesterday.  She fell into a pile of laundry, did not hit her head but did injure the left hip.  Patient reports pain in the groin and behind the left hip, especially with movement.         Past Medical History:  Diagnosis Date  . Chronic back pain 07/20/2018  . Dyslipidemia 07/20/2018  . Hypertension 07/20/2018    Patient Active Problem List   Diagnosis Date Noted  . Thrombocytosis 07/21/2018  . Altered mental status 07/20/2018  . AKI (acute kidney injury) ([place]) 07/20/2018  . Hypertension 07/20/2018  . Chronic back pain 07/20/2018  . Dyslipidemia 07/20/2018  . Hypokalemia 07/20/2018  . Hypomagnesemia 07/20/2018  . Hypokalemia 07/20/2018    History reviewed. No pertinent surgical history.   OB History   No obstetric history on file.     History reviewed. No pertinent family history.  Social History   Tobacco Use  . Smoking status: Current Every Day Smoker    Packs/day: 0.25    Types: Cigarettes  . Smokeless tobacco: Never Used    Home Medications Prior to Admission medications   Medication Sig Start Date End Date Taking? Authorizing Provider  baclofen (LIORESAL) 10 MG tablet Take 10 mg by mouth 2 (two) times daily.    [provider]  DULoxetine (CYMBALTA) 60 MG capsule Take 60 mg by mouth daily.    [provider]  fenofibrate (TRICOR) 145 MG tablet Take 145 mg by mouth daily.    [provider]  ibuprofen (ADVIL,MOTRIN) 800 MG tablet Take 800 mg by mouth every 8 (eight) hours as needed.    [provider]   lisinopril-hydrochlorothiazide (PRINZIDE,ZESTORETIC) 10-12.5 MG tablet Take 1 tablet by mouth daily.    [provider]  oxyCODONE-acetaminophen (PERCOCET) 10-325 MG tablet Take 1 tablet by mouth 4 (four) times daily.     [provider]  potassium chloride SA (K-DUR,KLOR-CON) 20 MEQ tablet Take 20 mEq by mouth daily.    [provider]    Allergies    Patient has no known allergies.  Review of Systems   Review of Systems  Musculoskeletal: Positive for arthralgias.  All other systems reviewed and are negative.   Physical Exam Updated Vital Signs BP (!) 151/96   Pulse (!) 111   Temp 98.5 F (36.9 C)   Resp 18   Ht 5\' 6"  (1.676 m)   Wt 64.4 kg   SpO2 100%   BMI 22.92 kg/m   Physical Exam Vitals and nursing note reviewed.  Constitutional:      General: She is not in acute distress.    Appearance: Normal appearance. She is well-developed.  HENT:     Head: Normocephalic and atraumatic.     Right Ear: Hearing normal.     Left Ear: Hearing normal.     Nose: Nose normal.  Eyes:     Conjunctiva/sclera: Conjunctivae normal.     Pupils: Pupils are equal, round, and reactive to light.  Cardiovascular:     Rate and Rhythm: Regular rhythm.     Heart  sounds: S1 normal and S2 normal. No murmur heard. No friction rub. No gallop.   Pulmonary:     Effort: Pulmonary effort is normal. No respiratory distress.     Breath sounds: Normal breath sounds.  Chest:     Chest wall: No tenderness.  Abdominal:     General: Bowel sounds are normal.     Palpations: Abdomen is soft.     Tenderness: There is no abdominal tenderness. There is no guarding or rebound. Negative signs include Murphy's sign and McBurney's sign.     Hernia: No hernia is present.  Musculoskeletal:     Cervical back: Normal range of motion and neck supple.     Left hip: Tenderness present. No deformity, lacerations or crepitus. Decreased range of motion.  Skin:    General: Skin is warm and  dry.     Findings: No rash.  Neurological:     Mental Status: She is alert and oriented to person, place, and time.     GCS: GCS eye subscore is 4. GCS verbal subscore is 5. GCS motor subscore is 6.     Cranial Nerves: No cranial nerve deficit.     Sensory: No sensory deficit.     Coordination: Coordination normal.  Psychiatric:        Speech: Speech normal.        Behavior: Behavior normal.        Thought Content: Thought content normal.     ED Results / Procedures / Treatments   Labs (all labs ordered are listed, but only abnormal results are displayed) Labs Reviewed  CBC WITH DIFFERENTIAL/PLATELET - Abnormal; Notable for the following components:      Result Value   WBC 11.3 (*)    Hemoglobin 11.6 (*)    HCT 35.6 (*)    Platelets 549 (*)    Neutro Abs 8.6 (*)    All other components within normal limits  BASIC METABOLIC PANEL - Abnormal; Notable for the following components:   Potassium 2.8 (*)    Glucose, Bld 116 (*)    All other components within normal limits  RESP PANEL BY RT-PCR (FLU A&B, COVID) [id number]  PROTIME-INR    EKG EKG Interpretation  Date/Time:  Monday October 13 2020 00:11:59 EDT Ventricular Rate:  98 PR Interval:    QRS Duration: 85 QT Interval:  330 QTC Calculation: 422 R Axis:   63 Text Interpretation: Sinus rhythm Abnormal R-wave progression, early transition Nonspecific T abnormalities, lateral leads Confirmed by Orpah Greek [phone]) on 10/13/2020 12:32:54 AM   Radiology DG Chest 1 View  Result Date: 10/13/2020 CLINICAL DATA:  Left hip fracture EXAM: CHEST  1 VIEW COMPARISON:  None. FINDINGS: The heart size and mediastinal contours are within normal limits. Both lungs are clear. The visualized skeletal structures are unremarkable. IMPRESSION: No active disease. Electronically Signed   By: Fidela Salisbury MD   On: 10/13/2020 00:14   DG Hip Unilat W or Wo Pelvis 2-3 Views Left  Result Date: 10/13/2020 CLINICAL DATA:  Fall, left hip pain  EXAM: DG HIP (WITH OR WITHOUT PELVIS) 2-3V LEFT COMPARISON:  None. FINDINGS: Single view radiograph of the pelvis and two view radiograph of the left hip demonstrate an acute left subcapital femoral neck fracture with superolateral displacement and external rotation of the distal fracture fragment. The femoral head is still seated within the left acetabulum. Left hip joint space appears preserved. Limited evaluation of the right hip is unremarkable. Lumbosacral fusion with instrumentation has  been performed, not well assessed. IMPRESSION: Acute, displaced, rotated left subcapital femoral neck fracture. Electronically Signed   By: Fidela Salisbury MD   On: 10/13/2020 00:13    Procedures Procedures   Medications Ordered in ED Medications  oxyCODONE-acetaminophen (PERCOCET/ROXICET) 5-325 MG per tablet 1 tablet (1 tablet Oral Given 10/12/20 2340)    ED Course  I have reviewed the triage vital signs and the nursing notes.  Pertinent labs & imaging results that were available during my care of the patient were reviewed by me and considered in my medical decision making (see chart for details).    MDM Rules/Calculators/A&P                          Patient presents to the emergency department for evaluation of left hip pain after a fall.  Patient suffered a ground-level mechanical fall with isolated left hip pain after the fall.  X-ray shows subcapital femoral neck fracture.  Will be admitted for surgical intervention.  Discussed with Dr. Erlinda Hong, keep n.p.o. will consult on patient for surgical intervention.  Addendum: Received call from Dr. Erlinda Hong, requesting exploring the possibility of keeping the patient at [facility] because of lack of beds at [facility].  I did contact Dr. Aline Brochure who is on-call for Ortho here at [facility] after 7 AM.  We discussed the nature of the fracture and he felt that the patient would require a total hip replacement and that she would need to go to [facility] for that procedure.   Continue with original plan.  Final Clinical Impression(s) / ED Diagnoses Final diagnoses:  Closed fracture of left hip, initial encounter [facility])    Rx / DC Orders ED Discharge Orders    None       Aracelys Glade, Gwenyth Allegra, MD 10/13/20 7829    Orpah Greek, MD 10/13/20 0700

## 2020-10-13 ENCOUNTER — Inpatient Hospital Stay (HOSPITAL_COMMUNITY): Payer: Medicaid Other

## 2020-10-13 DIAGNOSIS — Z20822 Contact with and (suspected) exposure to covid-19: Secondary | ICD-10-CM | POA: Diagnosis present

## 2020-10-13 DIAGNOSIS — D75839 Thrombocytosis, unspecified: Secondary | ICD-10-CM

## 2020-10-13 DIAGNOSIS — W010XXA Fall on same level from slipping, tripping and stumbling without subsequent striking against object, initial encounter: Secondary | ICD-10-CM | POA: Diagnosis present

## 2020-10-13 DIAGNOSIS — I10 Essential (primary) hypertension: Secondary | ICD-10-CM | POA: Diagnosis present

## 2020-10-13 DIAGNOSIS — Z79891 Long term (current) use of opiate analgesic: Secondary | ICD-10-CM | POA: Diagnosis not present

## 2020-10-13 DIAGNOSIS — D72829 Elevated white blood cell count, unspecified: Secondary | ICD-10-CM | POA: Diagnosis present

## 2020-10-13 DIAGNOSIS — D649 Anemia, unspecified: Secondary | ICD-10-CM

## 2020-10-13 DIAGNOSIS — Z79899 Other long term (current) drug therapy: Secondary | ICD-10-CM | POA: Diagnosis not present

## 2020-10-13 DIAGNOSIS — F17210 Nicotine dependence, cigarettes, uncomplicated: Secondary | ICD-10-CM | POA: Diagnosis present

## 2020-10-13 DIAGNOSIS — D62 Acute posthemorrhagic anemia: Secondary | ICD-10-CM | POA: Diagnosis not present

## 2020-10-13 DIAGNOSIS — M549 Dorsalgia, unspecified: Secondary | ICD-10-CM | POA: Diagnosis present

## 2020-10-13 DIAGNOSIS — W19XXXA Unspecified fall, initial encounter: Secondary | ICD-10-CM

## 2020-10-13 DIAGNOSIS — E785 Hyperlipidemia, unspecified: Secondary | ICD-10-CM | POA: Diagnosis present

## 2020-10-13 DIAGNOSIS — F419 Anxiety disorder, unspecified: Secondary | ICD-10-CM | POA: Diagnosis present

## 2020-10-13 DIAGNOSIS — G8929 Other chronic pain: Secondary | ICD-10-CM | POA: Diagnosis present

## 2020-10-13 DIAGNOSIS — S72002A Fracture of unspecified part of neck of left femur, initial encounter for closed fracture: Secondary | ICD-10-CM | POA: Diagnosis not present

## 2020-10-13 DIAGNOSIS — Y92019 Unspecified place in single-family (private) house as the place of occurrence of the external cause: Secondary | ICD-10-CM | POA: Diagnosis not present

## 2020-10-13 DIAGNOSIS — E876 Hypokalemia: Secondary | ICD-10-CM

## 2020-10-13 DIAGNOSIS — F32A Depression, unspecified: Secondary | ICD-10-CM | POA: Diagnosis present

## 2020-10-13 DIAGNOSIS — S72012A Unspecified intracapsular fracture of left femur, initial encounter for closed fracture: Secondary | ICD-10-CM | POA: Diagnosis present

## 2020-10-13 LAB — CBC WITH DIFFERENTIAL/PLATELET
Abs Immature Granulocytes: 0.03 10*3/uL (ref 0.00–0.07)
Basophils Absolute: 0 10*3/uL (ref 0.0–0.1)
Basophils Relative: 0 %
Eosinophils Absolute: 0.1 10*3/uL (ref 0.0–0.5)
Eosinophils Relative: 1 %
HCT: 35.6 % — ABNORMAL LOW (ref 36.0–46.0)
Hemoglobin: 11.6 g/dL — ABNORMAL LOW (ref 12.0–15.0)
Immature Granulocytes: 0 %
Lymphocytes Relative: 15 %
Lymphs Abs: 1.7 10*3/uL (ref 0.7–4.0)
MCH: 29.6 pg (ref 26.0–34.0)
MCHC: 32.6 g/dL (ref 30.0–36.0)
MCV: 90.8 fL (ref 80.0–100.0)
Monocytes Absolute: 0.9 10*3/uL (ref 0.1–1.0)
Monocytes Relative: 8 %
Neutro Abs: 8.6 10*3/uL — ABNORMAL HIGH (ref 1.7–7.7)
Neutrophils Relative %: 76 %
Platelets: 549 10*3/uL — ABNORMAL HIGH (ref 150–400)
RBC: 3.92 MIL/uL (ref 3.87–5.11)
RDW: 13.2 % (ref 11.5–15.5)
WBC: 11.3 10*3/uL — ABNORMAL HIGH (ref 4.0–10.5)
nRBC: 0 % (ref 0.0–0.2)

## 2020-10-13 LAB — PROTIME-INR
INR: 1 (ref 0.8–1.2)
Prothrombin Time: 12.6 seconds (ref 11.4–15.2)

## 2020-10-13 LAB — BASIC METABOLIC PANEL
Anion gap: 12 (ref 5–15)
BUN: 12 mg/dL (ref 6–20)
CO2: 23 mmol/L (ref 22–32)
Calcium: 9.3 mg/dL (ref 8.9–10.3)
Chloride: 100 mmol/L (ref 98–111)
Creatinine, Ser: 0.71 mg/dL (ref 0.44–1.00)
GFR, Estimated: >60 mL/min (ref >60)
Glucose, Bld: 116 mg/dL — ABNORMAL HIGH (ref 70–99)
Potassium: 2.8 mmol/L — ABNORMAL LOW (ref 3.5–5.1)
Sodium: 135 mmol/L (ref 135–145)

## 2020-10-13 LAB — RESP PANEL BY RT-PCR (FLU A&B, COVID) ARPGX2
Influenza A by PCR: NEGATIVE
Influenza B by PCR: NEGATIVE
SARS Coronavirus 2 by RT PCR: NEGATIVE

## 2020-10-13 LAB — PHOSPHORUS: Phosphorus: 3.1 mg/dL (ref 2.5–4.6)

## 2020-10-13 LAB — SURGICAL PCR SCREEN
MRSA, PCR: NEGATIVE
Staphylococcus aureus: NEGATIVE

## 2020-10-13 LAB — MAGNESIUM: Magnesium: 1.8 mg/dL (ref 1.7–2.4)

## 2020-10-13 LAB — HIV ANTIBODY (ROUTINE TESTING W REFLEX): HIV Screen 4th Generation wRfx: NONREACTIVE

## 2020-10-13 MED ORDER — POTASSIUM CHLORIDE 10 MEQ/100ML IV SOLN
10.0000 meq | INTRAVENOUS | Status: AC
  Administered 2020-10-13 (×2): 10 meq via INTRAVENOUS
  Filled 2020-10-13 (×2): qty 100

## 2020-10-13 MED ORDER — ENOXAPARIN SODIUM 40 MG/0.4ML ~~LOC~~ SOLN
40.0000 mg | SUBCUTANEOUS | Status: AC
  Administered 2020-10-13 – 2020-10-14 (×2): 40 mg via SUBCUTANEOUS
  Filled 2020-10-13 (×2): qty 0.4

## 2020-10-13 MED ORDER — OXYCODONE HCL 5 MG PO TABS
5.0000 mg | ORAL_TABLET | ORAL | Status: DC | PRN
Start: 2020-10-13 — End: 2020-10-14
  Administered 2020-10-13 (×3): 10 mg via ORAL
  Administered 2020-10-14: 5 mg via ORAL
  Administered 2020-10-14: 10 mg via ORAL
  Filled 2020-10-13 (×5): qty 2

## 2020-10-13 MED ORDER — HYDROMORPHONE HCL 1 MG/ML IJ SOLN
0.5000 mg | INTRAMUSCULAR | Status: DC | PRN
  Administered 2020-10-13 (×3): 0.5 mg via INTRAVENOUS
  Filled 2020-10-13: qty 1
  Filled 2020-10-13: qty 0.5
  Filled 2020-10-13: qty 1

## 2020-10-13 MED ORDER — POTASSIUM CHLORIDE CRYS ER 20 MEQ PO TBCR
40.0000 meq | EXTENDED_RELEASE_TABLET | Freq: Once | ORAL | Status: AC
  Administered 2020-10-13: 40 meq via ORAL
  Filled 2020-10-13: qty 2

## 2020-10-13 MED ORDER — HYDROMORPHONE HCL 1 MG/ML IJ SOLN: 0.5000 mg | INTRAMUSCULAR | Status: DC | PRN

## 2020-10-13 MED ORDER — HYDROMORPHONE HCL 1 MG/ML IJ SOLN
0.5000 mg | INTRAMUSCULAR | Status: DC | PRN
  Administered 2020-10-13 – 2020-10-14 (×4): 1 mg via INTRAVENOUS
  Filled 2020-10-13 (×5): qty 1

## 2020-10-13 MED ORDER — HYDROMORPHONE HCL 1 MG/ML IJ SOLN
0.5000 mg | Freq: Once | INTRAMUSCULAR | Status: AC
  Administered 2020-10-13: 0.5 mg via INTRAVENOUS
  Filled 2020-10-13: qty 1

## 2020-10-13 NOTE — Consult Note (Addendum)
ORTHOPAEDIC CONSULTATION  REQUESTING PHYSICIAN: Orson Eva, MD  Chief Complaint: Left femoral neck fracture  HPI: Allison Berry is a pleasant 55 year old female who presented to the [facility] ED last night with severe left hip pain and inability to ambulate and weight bear since last sat, over a week ago.  She sustained a mechanical fall at that time.  Prior to this, she had left hip pain for several weeks and xrays done at her PCP were negative per patient report.  She denies any lumbar radiculopathy.  She denies any LOC, neck pain, abd pain.  She was transferred to [facility] due to lack of orthopedic coverage at [facility] and also due to the fact that Dr. Aline Brochure does not perform THAs.   Ortho consulted for surgical evaluation.  Past Medical History:  Diagnosis Date  . Chronic back pain 07/20/2018  . Dyslipidemia 07/20/2018  . Hypertension 07/20/2018   History reviewed. No pertinent surgical history. Social History   Socioeconomic History  . Marital status: Married    Spouse name: Not on file  . Number of children: Not on file  . Years of education: Not on file  . Highest education level: Not on file  Occupational History  . Not on file  Tobacco Use  . Smoking status: Current Every Day Smoker    Packs/day: 0.25    Types: Cigarettes  . Smokeless tobacco: Never Used  Substance and Sexual Activity  . Alcohol use: Not on file  . Drug use: Not on file  . Sexual activity: Not on file  Other Topics Concern  . Not on file  Social History Narrative  . Not on file   Social Determinants of Health   Financial Resource Strain: Not on file  Food Insecurity: Not on file  Transportation Needs: Not on file  Physical Activity: Not on file  Stress: Not on file  Social Connections: Not on file   History reviewed. No pertinent family history. No Known Allergies Prior to Admission medications   Medication Sig Start Date End Date Taking? Authorizing Provider  cyclobenzaprine (FLEXERIL) 10 MG tablet Take 10 mg by  mouth 3 (three) times daily as needed for muscle spasms. 07/17/20  Yes [provider]  DULoxetine (CYMBALTA) 60 MG capsule Take 60 mg by mouth daily.   Yes [provider]  fenofibrate (TRICOR) 145 MG tablet Take 145 mg by mouth daily.   Yes [provider]  gabapentin (NEURONTIN) 300 MG capsule Take 300-600 capsules by mouth 3 (three) times daily. 2 in am, 1 evening, 1 at night 10/07/20  Yes [provider]  ibuprofen (ADVIL,MOTRIN) 800 MG tablet Take 200 mg by mouth every 8 (eight) hours as needed for mild pain, headache or fever.   Yes [provider]  lisinopril-hydrochlorothiazide (PRINZIDE,ZESTORETIC) 10-12.5 MG tablet Take 1 tablet by mouth daily.   Yes [provider]  oxyCODONE-acetaminophen (PERCOCET) 10-325 MG tablet Take 1 tablet by mouth 4 (four) times daily.   Yes [provider]  potassium chloride SA (K-DUR,KLOR-CON) 20 MEQ tablet Take 20 mEq by mouth daily.   Yes [provider]  naloxone (NARCAN) nasal spray 4 mg/0.1 mL Place 0.4 mg into the nose once. 09/16/20   [provider]   DG Chest 1 View  Result Date: 10/13/2020 CLINICAL DATA:  Left hip fracture EXAM: CHEST  1 VIEW COMPARISON:  None. FINDINGS: The heart size and mediastinal contours are within normal limits. Both lungs are clear. The visualized skeletal structures are unremarkable.  IMPRESSION: No active disease. Electronically Signed   By: Fidela Salisbury MD   On: 10/13/2020 00:14   CT HIP LEFT WO CONTRAST  Result Date: 10/13/2020 CLINICAL DATA:  Left hip pain since a fall 10/11/2020. Initial encounter. EXAM: CT OF THE LEFT HIP WITHOUT CONTRAST TECHNIQUE: Multidetector CT imaging of the left hip was performed according to the standard protocol. Multiplanar CT image reconstructions were also generated. COMPARISON:  Plain films left hip 10/12/2020. CT of the pelvis 05/16/2018. FINDINGS: Bones/Joint/Cartilage As seen on the comparison plain films,  the patient has a subcapital fracture of the left hip. Although the patient suffered a recent fall, the fracture appears remote with corticated fracture margins and remodeling of the femoral neck. The femoral head is located. The shaft of the femur is superiorly displaced approximately 1.5 cm. Remote healed left inferior pubic ramus fracture is seen. Left hip joint space is preserved. A small bone island in the left ilium is unchanged since 2019. No worrisome bony lesion is identified. Vacuum disc phenomenon at L4-5 is partially imaged. The patient is status post L5-S1 fusion. Ligaments Suboptimally assessed by CT. Muscles and Tendons Appear intact. Soft tissues No acute or focal abnormality. IMPRESSION: Subcapital fracture of the left hip is new since 2019 but the fracture appears remote as described above. Remote healed right inferior pubic ramus fracture. Degenerative disc disease L4-5. The patient is status post L5-S1 fusion. Electronically Signed   By: Inge Rise M.D.   On: 10/13/2020 07:23   DG Hip Unilat W or Wo Pelvis 2-3 Views Left  Result Date: 10/13/2020 CLINICAL DATA:  Fall, left hip pain EXAM: DG HIP (WITH OR WITHOUT PELVIS) 2-3V LEFT COMPARISON:  None. FINDINGS: Single view radiograph of the pelvis and two view radiograph of the left hip demonstrate an acute left subcapital femoral neck fracture with superolateral displacement and external rotation of the distal fracture fragment. The femoral head is still seated within the left acetabulum. Left hip joint space appears preserved. Limited evaluation of the right hip is unremarkable. Lumbosacral fusion with instrumentation has been performed, not well assessed. IMPRESSION: Acute, displaced, rotated left subcapital femoral neck fracture. Electronically Signed   By: Fidela Salisbury MD   On: 10/13/2020 00:13    All pertinent xrays, MRI, CT independently reviewed and interpreted  Positive ROS: All other systems have been reviewed and were  otherwise negative with the exception of those mentioned in the HPI and as above.  Physical Exam: General: No acute distress Cardiovascular: No pedal edema Respiratory: No cyanosis, no use of accessory musculature GI: No organomegaly, abdomen is soft and non-tender Skin: No lesions in the area of chief complaint Neurologic: Sensation intact distally Psychiatric: Patient is at baseline mood and affect Lymphatic: No axillary or cervical lymphadenopathy  MUSCULOSKELETAL:  - marked pain with attempted ROM and movement of the hip and extremity - skin intact - NVI distally  Assessment: Left femoral neck fracture, questionable stress fracture  Plan: - CT and XRs consistent with acute on chronic femoral neck fracture resulting from fall from last Saturday - recommend THA for pain relief, early mobilization, ambulation - r/b/a reviewed with the patient and she is agreeable to proceed - will plan for surgery Wednesday - all questions answered - 2 doses of lovenox ordered for DVT ppx  Thank you for the consult and the opportunity to see Allison Berry. Eduard Roux, MD [facility] 2:53 PM

## 2020-10-13 NOTE — H&P (Signed)
History and Physical  Allison Berry EXH:[id number] DOB: 04-05-1966 DOA: 10/12/2020  Referring physician: Mardelle Matte, MD PCP: System, Provider Not In  Patient coming from: Home  Chief Complaint: Left hip pain  HPI: Allison Berry is a 55 y.o. female with medical history significant for hypertension, hyperlipidemia, chronic back pain who presents to the emergency department due to left hip pain sustained after an accidental fall at home.  She states that she accidentally fell on top of a pile of laundry at home yesterday.  She denies hitting her head, but she sustained left hip pain as well as pain in the left groin which worsens with movement.  Patient states that she started using a walker for ambulation about 2 weeks ago due to pain in the left hip joint, this has since worsened since the fall sustained.  She denies chest pain, shortness of breath, nausea, vomiting or headache.  ED Course: In the emergency department, she was initially tachycardic, otherwise she was hemodynamically stable.  Work-up in the ED showed mild leukocytosis, thrombocytosis, normocytic anemia, hypokalemia.  SARS coronavirus 2 virus was negative. Left hip x-ray showed acute, displaced, rotated left subcapital femoral neck fracture Chest x-ray personally reviewed showed no active disease.  She was treated with Percocet.  The pelvic surgery [facility] (Dr. Erlinda Hong) was consulted and recommended admitting patient to [facility] with plan to see patient in the morning for possible surgical intervention.  Review of Systems: Constitutional: Negative for chills and fever.  HENT: Negative for ear pain and sore throat.   Eyes: Negative for pain and visual disturbance.  Respiratory: Negative for cough, chest tightness and shortness of breath.   Cardiovascular: Negative for chest pain and palpitations.  Gastrointestinal: Negative for abdominal pain and vomiting.  Endocrine: Negative for polyphagia and polyuria.  Genitourinary: Negative  for decreased urine volume, dysuria, enuresis, hematuria Musculoskeletal: Positive for left hip pain. Skin: Negative for color change and rash.  Allergic/Immunologic: Negative for immunocompromised state.  Neurological: Negative for tremors, syncope, speech difficulty, weakness, light-headedness and headaches.  Hematological: Does not bruise/bleed easily.  All other systems reviewed and are negative   Past Medical History:  Diagnosis Date  . Chronic back pain 07/20/2018  . Dyslipidemia 07/20/2018  . Hypertension 07/20/2018   History reviewed. No pertinent surgical history.  Social History:  reports that she has been smoking cigarettes. She has been smoking about 0.25 packs per day. She has never used smokeless tobacco. No history on file for alcohol use and drug use.   No Known Allergies  History reviewed. No pertinent family history.    Prior to Admission medications   Medication Sig Start Date End Date Taking? Authorizing Provider  baclofen (LIORESAL) 10 MG tablet Take 10 mg by mouth 2 (two) times daily.    [provider]  DULoxetine (CYMBALTA) 60 MG capsule Take 60 mg by mouth daily.    [provider]  fenofibrate (TRICOR) 145 MG tablet Take 145 mg by mouth daily.    [provider]  ibuprofen (ADVIL,MOTRIN) 800 MG tablet Take 800 mg by mouth every 8 (eight) hours as needed.    [provider]  lisinopril-hydrochlorothiazide (PRINZIDE,ZESTORETIC) 10-12.5 MG tablet Take 1 tablet by mouth daily.    [provider]  oxyCODONE-acetaminophen (PERCOCET) 10-325 MG tablet Take 1 tablet by mouth 4 (four) times daily.     [provider]  potassium chloride SA (K-DUR,KLOR-CON) 20 MEQ tablet Take 20 mEq by mouth daily.    [provider]  Physical Exam: BP (!) 136/122   Pulse 100   Temp 98.5 F (36.9 C)   Resp 19   Ht 5\' 6"  (1.676 m)   Wt 64.4 kg   SpO2 100%   BMI 22.92 kg/m   . General: 55 y.o. year-old female well  developed well nourished in no acute distress.  Alert and oriented x3. Marland Kitchen HEENT: NCAT, EOMI . Neck: Supple, trachea medial . Cardiovascular: Regular rate and rhythm with no rubs or gallops.  No thyromegaly or JVD noted.  2/4 pulses in all 4 extremities. Marland Kitchen Respiratory: Clear to auscultation with no wheezes or rales. Good inspiratory effort. . Abdomen: Soft nontender nondistended with normal bowel sounds x4 quadrants. . Muskuloskeletal: Tender to palpation of left hip, restricted ROM of LLE due to pain. Marland Kitchen Neuro: CN II-XII intact, sensation, reflexes intact.  No focal neurologic deficit . Skin: No ulcerative lesions noted or rashes . Psychiatry: Judgement and insight appear normal. Mood is appropriate for condition and setting          Labs on Admission:  Basic Metabolic Panel: Recent Labs  Lab 10/13/20 0026  NA 135  K 2.8*  CL 100  CO2 23  GLUCOSE 116*  BUN 12  CREATININE 0.71  CALCIUM 9.3   Liver Function Tests: No results for input(s): AST, ALT, ALKPHOS, BILITOT, PROT, ALBUMIN in the last 168 hours. No results for input(s): LIPASE, AMYLASE in the last 168 hours. No results for input(s): AMMONIA in the last 168 hours. CBC: Recent Labs  Lab 10/13/20 0026  WBC 11.3*  NEUTROABS 8.6*  HGB 11.6*  HCT 35.6*  MCV 90.8  PLT 549*   Cardiac Enzymes: No results for input(s): CKTOTAL, CKMB, CKMBINDEX, TROPONINI in the last 168 hours.  BNP (last 3 results) No results for input(s): BNP in the last 8760 hours.  ProBNP (last 3 results) No results for input(s): PROBNP in the last 8760 hours.  CBG: No results for input(s): GLUCAP in the last 168 hours.  Radiological Exams on Admission: DG Chest 1 View  Result Date: 10/13/2020 CLINICAL DATA:  Left hip fracture EXAM: CHEST  1 VIEW COMPARISON:  None. FINDINGS: The heart size and mediastinal contours are within normal limits. Both lungs are clear. The visualized skeletal structures are unremarkable. IMPRESSION: No active disease.  Electronically Signed   By: Fidela Salisbury MD   On: 10/13/2020 00:14   DG Hip Unilat W or Wo Pelvis 2-3 Views Left  Result Date: 10/13/2020 CLINICAL DATA:  Fall, left hip pain EXAM: DG HIP (WITH OR WITHOUT PELVIS) 2-3V LEFT COMPARISON:  None. FINDINGS: Single view radiograph of the pelvis and two view radiograph of the left hip demonstrate an acute left subcapital femoral neck fracture with superolateral displacement and external rotation of the distal fracture fragment. The femoral head is still seated within the left acetabulum. Left hip joint space appears preserved. Limited evaluation of the right hip is unremarkable. Lumbosacral fusion with instrumentation has been performed, not well assessed. IMPRESSION: Acute, displaced, rotated left subcapital femoral neck fracture. Electronically Signed   By: Fidela Salisbury MD   On: 10/13/2020 00:13    EKG: I independently viewed the EKG done and my findings are as followed: Normal sinus rhythm at a rate of 98 bpm   Assessment/Plan Present on Admission: . Thrombocytosis . Hypokalemia . Hypertension . Dyslipidemia  Principal Problem:   Left displaced femoral neck fracture (HCC) Active Problems:   Hypertension   Dyslipidemia   Hypokalemia   Thrombocytosis  Accidental fall   Leukocytosis   Normocytic anemia   Left displaced femoral neck fracture secondary to accidental fall Continue IV a 0.5 mg every 4 hours as needed for moderate pain Continue fall precaution and neurochecks Continue PT/OT eval and treat Orthopedic surgeon was already consulted and recommended admitting patient to [facility] with plan to see patient in the morning for possible surgical intervention per ED physician  Hypokalemia K+ 2.8; this is possibly due to diuretic effect. This will be replenished  Leukocytosis and thrombocytosis (chronic) WBC 11.3, platelets 549 Continue to monitor CBC with morning labs  Normocytic anemia Stable, continue to monitor CBC with morning  labs  Essential hypertension (uncontrolled) Continue home meds when patient resumes oral intake (currently n.p.o.)  Dyslipidemia Continue home meds when patient resumes oral intake   DVT prophylaxis: SCDs  Code Status: Full code  Family Communication: None at bedside  Disposition Plan:  Patient is from:                        home Anticipated DC to:                   SNF or family members home Anticipated DC date:               2-3 days Anticipated DC barriers:         patient is unstable to be discharged at this time due to left femoral neck fracture which require surgical repair  Consults called: Orthopedic surgeon (per ED physician)  Admission status: Inpatient    Bernadette Hoit MD [facility]  10/13/2020, 3:32 AM

## 2020-10-13 NOTE — ED Notes (Signed)
Dr Tat at bedside 

## 2020-10-13 NOTE — Progress Notes (Signed)
PROGRESS NOTE  Allison Berry TGY:[id number] DOB: 04/21/1966 DOA: 10/12/2020 PCP: System, Provider Not In  Brief History:  55 year old female with a history of hypertension, tobacco abuse, hyperlipidemia, chronic back pain, depression presenting with a mechanical fall resulting in left hip pain.  At baseline, the patient ambulates with a cane.  Apparently, the patient had difficulty and tripped with her cane over her laundry on 10/12/2020 falling onto her left side.  She did not lose consciousness.  She states that she has actually been using a walker for the last 2 weeks because of worsening left hip joint pain.  She denied any prodromal symptoms.  She denies any fevers, chills, headache, chest pain, shortness of breath, palpitations, dizziness, nausea, vomiting, diarrhea, abdominal pain. Patient denies any history of MI or stroke.  She continues to smoke 1 pack/day. In the emergency department, the patient was afebrile and hemodynamically stable with oxygen saturation 97-99% on room air.  CT of the left hip showed a subcapital fracture of the left hip that is new since 2019.  Although the patient suffered a recent fall, the fracture appears remote with corticated fracture margins and remodeling of the femoral neck.  The shaft of the femur is superiorly displaced approximately 1.5 cm.  EDP spoke with ortho, Dr. Ephriam Jenkins  Assessment/Plan: Left subcapital femur fracture -she is medically stable to go to surgery presently after repletion of K -judicious opioids -PT/OT after surgery -Personally reviewed EKG--sinus rhythm, nonspecific T wave change  Hypokalemia -This was repleted in the emergency department -Magnesium 1.8  Essential hypertension -Temporarily holding lisinopril/HCTZ -BP well controlled  Tobacco abuse -Patient has 30-pack-year history -Tobacco cessation discussed -Stable on room air  Hyperlipidemia -Continue TriCor  Depression/anxiety -Continue  Cymbalta  Thrombocytosis -Likely acute phase reactant -Check iron studies      Status is: Inpatient  Remains inpatient appropriate because:Inpatient level of care appropriate due to severity of illness   Dispo: The patient is from: Home              Anticipated d/c is to: Home              Patient currently is not medically stable to d/c.   Difficult to place patient No        Family Communication:   Spouse updated at bedside 3/28 Consultants:  [facility]  Code Status:  FULL   DVT Prophylaxis:  SCDs   Procedures: As Listed in Progress Note Above  Antibiotics: None         Subjective: Pt complains of left hip pain.  Patient denies fevers, chills, headache, chest pain, dyspnea, nausea, vomiting, diarrhea, abdominal pain, dysuria, hematuria, hematochezia, and melena.   Objective: Vitals:   10/13/20 0500 10/13/20 0530 10/13/20 0600 10/13/20 0630  BP: (!) 123/100 125/67 (!) 130/103 111/78  Pulse: 97 96 94 97  Resp: (!) 25 (!) 25 (!) 26 (!) 22  Temp:      SpO2: 97% 100% 97% 95%  Weight:      Height:       No intake or output data in the 24 hours ending 10/13/20 0740 Weight change:  Exam:   General:  Pt is alert, follows commands appropriately, not in acute distress  HEENT: No icterus, No thrush, No neck mass, [place]/AT  Cardiovascular: RRR, S1/S2, no rubs, no gallops  Respiratory: CTA bilaterally, no wheezing, no crackles, no rhonchi  Abdomen: Soft/+BS, non tender, non distended, no guarding  Extremities:  No edema, No lymphangitis, No petechiae, No rashes, no synovitis   Data Reviewed: I have personally reviewed following labs and imaging studies Basic Metabolic Panel: Recent Labs  Lab 10/13/20 0026 10/13/20 0405  NA 135  --   K 2.8*  --   CL 100  --   CO2 23  --   GLUCOSE 116*  --   BUN 12  --   CREATININE 0.71  --   CALCIUM 9.3  --   MG  --  1.8  PHOS  --  3.1   Liver Function Tests: No results for input(s): AST, ALT,  ALKPHOS, BILITOT, PROT, ALBUMIN in the last 168 hours. No results for input(s): LIPASE, AMYLASE in the last 168 hours. No results for input(s): AMMONIA in the last 168 hours. Coagulation Profile: Recent Labs  Lab 10/13/20 0026  INR 1.0   CBC: Recent Labs  Lab 10/13/20 0026  WBC 11.3*  NEUTROABS 8.6*  HGB 11.6*  HCT 35.6*  MCV 90.8  PLT 549*   Cardiac Enzymes: No results for input(s): CKTOTAL, CKMB, CKMBINDEX, TROPONINI in the last 168 hours. BNP: Invalid input(s): POCBNP CBG: No results for input(s): GLUCAP in the last 168 hours. HbA1C: No results for input(s): HGBA1C in the last 72 hours. Urine analysis:    Component Value Date/Time   COLORURINE AMBER (A) 07/20/2018 1413   APPEARANCEUR CLOUDY (A) 07/20/2018 1413   LABSPEC 1.027 07/20/2018 1413   PHURINE 5.0 07/20/2018 1413   GLUCOSEU NEGATIVE 07/20/2018 1413   HGBUR NEGATIVE 07/20/2018 1413   BILIRUBINUR SMALL (A) 07/20/2018 1413   KETONESUR NEGATIVE 07/20/2018 1413   PROTEINUR 30 (A) 07/20/2018 1413   NITRITE NEGATIVE 07/20/2018 1413   LEUKOCYTESUR TRACE (A) 07/20/2018 1413   Sepsis Labs: @LABRCNTIP (procalcitonin:4,lacticidven:4) ) Recent Results (from the past 240 hour(s))  Resp Panel by RT-PCR (Flu A&B, Covid) Nasopharyngeal Swab     Status: None   Collection Time: 10/13/20 12:28 AM   Specimen: Nasopharyngeal Swab; Nasopharyngeal(NP) swabs in vial transport medium  Result Value Ref Range Status   SARS Coronavirus 2 by RT PCR NEGATIVE NEGATIVE Final    Comment: (NOTE) SARS-CoV-2 target nucleic acids are NOT DETECTED.  The SARS-CoV-2 RNA is generally detectable in upper respiratory specimens during the acute phase of infection. The lowest concentration of SARS-CoV-2 viral copies this assay can detect is 138 copies/mL. A negative result does not preclude SARS-Cov-2 infection and should not be used as the sole basis for treatment or other patient management decisions. A negative result may occur with   improper specimen collection/handling, submission of specimen other than nasopharyngeal swab, presence of viral mutation(s) within the areas targeted by this assay, and inadequate number of viral copies(<138 copies/mL). A negative result must be combined with clinical observations, patient history, and epidemiological information. The expected result is Negative.  Fact Sheet for Patients:  EntrepreneurPulse.com.au  Fact Sheet for Healthcare Providers:  IncredibleEmployment.be  This test is no t yet approved or cleared by the [place] FDA and  has been authorized for detection and/or diagnosis of SARS-CoV-2 by FDA under an Emergency Use Authorization (EUA). This EUA will remain  in effect (meaning this test can be used) for the duration of the COVID-19 declaration under Section 564(b)(1) of the Act, 21 U.S.C.section 360bbb-3(b)(1), unless the authorization is terminated  or revoked sooner.       Influenza A by PCR NEGATIVE NEGATIVE Final   Influenza B by PCR NEGATIVE NEGATIVE Final    Comment: (NOTE) The Xpert Xpress SARS-CoV-2/FLU/RSV  plus assay is intended as an aid in the diagnosis of influenza from Nasopharyngeal swab specimens and should not be used as a sole basis for treatment. Nasal washings and aspirates are unacceptable for Xpert Xpress SARS-CoV-2/FLU/RSV testing.  Fact Sheet for Patients: EntrepreneurPulse.com.au  Fact Sheet for Healthcare Providers: IncredibleEmployment.be  This test is not yet approved or cleared by the [place] FDA and has been authorized for detection and/or diagnosis of SARS-CoV-2 by FDA under an Emergency Use Authorization (EUA). This EUA will remain in effect (meaning this test can be used) for the duration of the COVID-19 declaration under Section 564(b)(1) of the Act, 21 U.S.C. section 360bbb-3(b)(1), unless the authorization is terminated  or revoked.  Performed at [facility], [address], [place], [place] [address]      Scheduled Meds: Continuous Infusions:  Procedures/Studies: DG Chest 1 View  Result Date: 10/13/2020 CLINICAL DATA:  Left hip fracture EXAM: CHEST  1 VIEW COMPARISON:  None. FINDINGS: The heart size and mediastinal contours are within normal limits. Both lungs are clear. The visualized skeletal structures are unremarkable. IMPRESSION: No active disease. Electronically Signed   By: Fidela Salisbury MD   On: 10/13/2020 00:14   CT HIP LEFT WO CONTRAST  Result Date: 10/13/2020 CLINICAL DATA:  Left hip pain since a fall 10/11/2020. Initial encounter. EXAM: CT OF THE LEFT HIP WITHOUT CONTRAST TECHNIQUE: Multidetector CT imaging of the left hip was performed according to the standard protocol. Multiplanar CT image reconstructions were also generated. COMPARISON:  Plain films left hip 10/12/2020. CT of the pelvis 05/16/2018. FINDINGS: Bones/Joint/Cartilage As seen on the comparison plain films, the patient has a subcapital fracture of the left hip. Although the patient suffered a recent fall, the fracture appears remote with corticated fracture margins and remodeling of the femoral neck. The femoral head is located. The shaft of the femur is superiorly displaced approximately 1.5 cm. Remote healed left inferior pubic ramus fracture is seen. Left hip joint space is preserved. A small bone island in the left ilium is unchanged since 2019. No worrisome bony lesion is identified. Vacuum disc phenomenon at L4-5 is partially imaged. The patient is status post L5-S1 fusion. Ligaments Suboptimally assessed by CT. Muscles and Tendons Appear intact. Soft tissues No acute or focal abnormality. IMPRESSION: Subcapital fracture of the left hip is new since 2019 but the fracture appears remote as described above. Remote healed right inferior pubic ramus fracture. Degenerative disc disease L4-5. The patient is status post L5-S1 fusion.  Electronically Signed   By: Inge Rise M.D.   On: 10/13/2020 07:23   DG Hip Unilat W or Wo Pelvis 2-3 Views Left  Result Date: 10/13/2020 CLINICAL DATA:  Fall, left hip pain EXAM: DG HIP (WITH OR WITHOUT PELVIS) 2-3V LEFT COMPARISON:  None. FINDINGS: Single view radiograph of the pelvis and two view radiograph of the left hip demonstrate an acute left subcapital femoral neck fracture with superolateral displacement and external rotation of the distal fracture fragment. The femoral head is still seated within the left acetabulum. Left hip joint space appears preserved. Limited evaluation of the right hip is unremarkable. Lumbosacral fusion with instrumentation has been performed, not well assessed. IMPRESSION: Acute, displaced, rotated left subcapital femoral neck fracture. Electronically Signed   By: Fidela Salisbury MD   On: 10/13/2020 00:13    Orson Eva, DO  [facility]  If 7PM-7AM, please contact night-coverage [email or web address] Password [facility] 10/13/2020, 7:40 AM   LOS: 0 days

## 2020-10-13 NOTE — Progress Notes (Signed)
I reviewed xrays and CT scan.  Fracture appears to be acute on chronic.  Transfer to [facility] pending.  I will evaluate once patient arrives at [facility] to determine how urgent surgical need is.    Azucena Cecil, MD [facility] [phone] 9:45 AM

## 2020-10-14 LAB — CBC
HCT: 32.8 % — ABNORMAL LOW (ref 36.0–46.0)
Hemoglobin: 11.3 g/dL — ABNORMAL LOW (ref 12.0–15.0)
MCH: 30.1 pg (ref 26.0–34.0)
MCHC: 34.5 g/dL (ref 30.0–36.0)
MCV: 87.2 fL (ref 80.0–100.0)
Platelets: 532 10*3/uL — ABNORMAL HIGH (ref 150–400)
RBC: 3.76 MIL/uL — ABNORMAL LOW (ref 3.87–5.11)
RDW: 13.2 % (ref 11.5–15.5)
WBC: 9.3 10*3/uL (ref 4.0–10.5)
nRBC: 0 % (ref 0.0–0.2)

## 2020-10-14 LAB — COMPREHENSIVE METABOLIC PANEL
ALT: 34 U/L (ref 0–44)
AST: 34 U/L (ref 15–41)
Albumin: 3.1 g/dL — ABNORMAL LOW (ref 3.5–5.0)
Alkaline Phosphatase: 73 U/L (ref 38–126)
Anion gap: 8 (ref 5–15)
BUN: 7 mg/dL (ref 6–20)
CO2: 26 mmol/L (ref 22–32)
Calcium: 9.7 mg/dL (ref 8.9–10.3)
Chloride: 101 mmol/L (ref 98–111)
Creatinine, Ser: 0.63 mg/dL (ref 0.44–1.00)
GFR, Estimated: >60 mL/min (ref >60)
Glucose, Bld: 112 mg/dL — ABNORMAL HIGH (ref 70–99)
Potassium: 4.1 mmol/L (ref 3.5–5.1)
Sodium: 135 mmol/L (ref 135–145)
Total Bilirubin: 0.6 mg/dL (ref 0.3–1.2)
Total Protein: 6.6 g/dL (ref 6.5–8.1)

## 2020-10-14 LAB — IRON AND TIBC
Iron: 30 ug/dL (ref 28–170)
Saturation Ratios: 7 % — ABNORMAL LOW (ref 10.4–31.8)
TIBC: 412 ug/dL (ref 250–450)
UIBC: 382 ug/dL

## 2020-10-14 LAB — PROTIME-INR
INR: 1 (ref 0.8–1.2)
Prothrombin Time: 13.2 seconds (ref 11.4–15.2)

## 2020-10-14 LAB — FERRITIN: Ferritin: 149 ng/mL (ref 11–307)

## 2020-10-14 LAB — MAGNESIUM: Magnesium: 1.9 mg/dL (ref 1.7–2.4)

## 2020-10-14 LAB — APTT: aPTT: 37 seconds — ABNORMAL HIGH (ref 24–36)

## 2020-10-14 MED ORDER — HYDROMORPHONE HCL 1 MG/ML IJ SOLN
1.0000 mg | Freq: Once | INTRAMUSCULAR | Status: AC
  Administered 2020-10-14: 1 mg via INTRAVENOUS

## 2020-10-14 MED ORDER — DULOXETINE HCL 60 MG PO CPEP
60.0000 mg | ORAL_CAPSULE | Freq: Every day | ORAL | Status: DC
  Administered 2020-10-14 – 2020-10-19 (×6): 60 mg via ORAL
  Filled 2020-10-14 (×6): qty 1

## 2020-10-14 MED ORDER — OXYCODONE HCL 5 MG PO TABS
10.0000 mg | ORAL_TABLET | ORAL | Status: DC | PRN
  Administered 2020-10-14: 10 mg via ORAL
  Filled 2020-10-14: qty 2

## 2020-10-14 MED ORDER — HYDROMORPHONE HCL 1 MG/ML IJ SOLN
1.0000 mg | INTRAMUSCULAR | Status: DC | PRN
  Administered 2020-10-14: 2 mg via INTRAVENOUS
  Administered 2020-10-14: 1 mg via INTRAVENOUS
  Administered 2020-10-14 – 2020-10-16 (×9): 2 mg via INTRAVENOUS
  Administered 2020-10-18: 1 mg via INTRAVENOUS
  Filled 2020-10-14: qty 1
  Filled 2020-10-14 (×9): qty 2
  Filled 2020-10-14: qty 1
  Filled 2020-10-14 (×2): qty 2

## 2020-10-14 MED ORDER — OXYCODONE HCL 5 MG PO TABS
10.0000 mg | ORAL_TABLET | ORAL | Status: DC | PRN
  Administered 2020-10-14 – 2020-10-15 (×6): 15 mg via ORAL
  Filled 2020-10-14: qty 2
  Filled 2020-10-14 (×6): qty 3

## 2020-10-14 MED ORDER — FENOFIBRATE 160 MG PO TABS
160.0000 mg | ORAL_TABLET | Freq: Every day | ORAL | Status: DC
  Administered 2020-10-14 – 2020-10-19 (×6): 160 mg via ORAL
  Filled 2020-10-14 (×6): qty 1

## 2020-10-14 NOTE — Plan of Care (Signed)
  Problem: Activity: Goal: Risk for activity intolerance will decrease Outcome: Not Progressing   Problem: Pain Managment: Goal: General experience of comfort will improve Outcome: Not Progressing   

## 2020-10-14 NOTE — Plan of Care (Signed)

## 2020-10-14 NOTE — Progress Notes (Signed)
PT Cancellation Note  Patient Details Name: Allison Berry MRN: [id number] DOB: [birth year]   Cancelled Treatment:    Reason Eval/Treat Not Completed: Other (comment). Pt hasn't had L ant THA yet, planned for Wednesday 3/30. PT to return s/p surgery to complete PT eval as appropriate and able.  Kittie Plater, PT, DPT Acute Rehabilitation Services Pager #: [phone] Office #: [phone]    Berline Lopes 10/14/2020, 8:01 AM

## 2020-10-14 NOTE — Progress Notes (Signed)
OT Cancellation Note  Patient Details Name: Allison Berry MRN: [id number] DOB: [birth year]   Cancelled Treatment:    Reason Eval/Treat Not Completed: Patient not medically ready. Surgery planned for tomorrow. Will assess when appropriate at later date.   Ramond Dial, OT/L   Acute OT Clinical Specialist Acute Rehabilitation Services Pager [phone] Office [phone]  10/14/2020, 8:07 AM

## 2020-10-14 NOTE — Progress Notes (Signed)
Patient ID: Allison Berry, female   DOB: 1966/03/01, 55 y.o.   MRN: [id number]  PROGRESS NOTE    Allison Berry  YBW:[id number] DOB: [birth year] DOA: 10/12/2020 PCP: System, Provider Not In   Brief Narrative:  55 year old female with a history of hypertension, tobacco abuse, hyperlipidemia, chronic back pain, depression presented with a mechanical fall and left hip pain.  On presentation, CT of the left hip showed subcapital fracture of the left hip.  Orthopedics was consulted and recommended transfer to [place]:   Left subcapital femur fracture Mechanical fall -Orthopedics following and planning for surgery on 10/15/2020. -Continue pain management.  Fall precautions.  Hypokalemia -Improved  Leukocytosis--possibly reactive.  Resolved  Essential hypertension -Blood pressure intermittently on the higher side.  Lisinopril/hydrochlorothiazide being held preoperatively  Hyperlipidemia -Continue TriCor  Depression/anxiety -Continue Cymbalta  Tobacco abuse -Patient has 30-pack-year history -Tobacco cessation discussed by prior hospitalist -Stable on room air  Thrombocytosis -Possibly reactive    DVT prophylaxis: Lovenox.  Hold dose prior to surgery Code Status: Full Family Communication: None at bedside Disposition Plan: Status is: Inpatient  Remains inpatient appropriate because:Inpatient level of care appropriate due to severity of illness   Dispo: The patient is from: Home              Anticipated d/c is to: Home              Patient currently is not medically stable to d/c.   Difficult to place patient No   Consultants: Orthopedics  Procedures: None  Antimicrobials: None   Subjective: Patient seen and examined at bedside.  Complains of severe left hip pain.  No overnight fever, vomiting reported.  Objective: Vitals:   10/13/20 1121 10/13/20 1525 10/13/20 2005 10/14/20 0337  BP: (!) 141/69 (!) 144/81 123/84 (!) 154/85   Pulse: 93 95 98 94  Resp: 18 18 17 18   Temp: 99 F (37.2 C) 99.1 F (37.3 C) 98.7 F (37.1 C) 98.6 F (37 C)  TempSrc: Oral Oral Oral Oral  SpO2: 96% 95% 95% 97%  Weight:      Height:        Intake/Output Summary (Last 24 hours) at 10/14/2020 0753 Last data filed at 10/14/2020 0500 Gross per 24 hour  Intake 120 ml  Output 1100 ml  Net -980 ml   Filed Weights   10/12/20 2324  Weight: 64.4 kg    Examination:  General exam: No acute distress.  Currently on room air Respiratory system: Bilateral decreased breath sounds at bases Cardiovascular system: S1 & S2 heard, Rate controlled Gastrointestinal system: Abdomen is nondistended, soft and nontender. Normal bowel sounds heard. Extremities: No cyanosis, clubbing, edema    Data Reviewed: I have personally reviewed following labs and imaging studies  CBC: Recent Labs  Lab 10/13/20 0026 10/14/20 0306  WBC 11.3* 9.3  NEUTROABS 8.6*  --   HGB 11.6* 11.3*  HCT 35.6* 32.8*  MCV 90.8 87.2  PLT 549* 768*   Basic Metabolic Panel: Recent Labs  Lab 10/13/20 0026 10/13/20 0405 10/14/20 0306  NA 135  --  135  K 2.8*  --  4.1  CL 100  --  101  CO2 23  --  26  GLUCOSE 116*  --  112*  BUN 12  --  7  CREATININE 0.71  --  0.63  CALCIUM 9.3  --  9.7  MG  --  1.8 1.9  PHOS  --  3.1  --  GFR: Estimated Creatinine Clearance: 76.1 mL/min (by C-G formula based on SCr of 0.63 mg/dL). Liver Function Tests: Recent Labs  Lab 10/14/20 0306  AST 34  ALT 34  ALKPHOS 73  BILITOT 0.6  PROT 6.6  ALBUMIN 3.1*   No results for input(s): LIPASE, AMYLASE in the last 168 hours. No results for input(s): AMMONIA in the last 168 hours. Coagulation Profile: Recent Labs  Lab 10/13/20 0026 10/14/20 0306  INR 1.0 1.0   Cardiac Enzymes: No results for input(s): CKTOTAL, CKMB, CKMBINDEX, TROPONINI in the last 168 hours. BNP (last 3 results) No results for input(s): PROBNP in the last 8760 hours. HbA1C: No results for input(s):  HGBA1C in the last 72 hours. CBG: No results for input(s): GLUCAP in the last 168 hours. Lipid Profile: No results for input(s): CHOL, HDL, LDLCALC, TRIG, CHOLHDL, LDLDIRECT in the last 72 hours. Thyroid Function Tests: No results for input(s): TSH, T4TOTAL, FREET4, T3FREE, THYROIDAB in the last 72 hours. Anemia Panel: Recent Labs    10/14/20 0306  FERRITIN 149  TIBC 412  IRON 30   Sepsis Labs: No results for input(s): PROCALCITON, LATICACIDVEN in the last 168 hours.  Recent Results (from the past 240 hour(s))  Resp Panel by RT-PCR (Flu A&B, Covid) Nasopharyngeal Swab     Status: None   Collection Time: 10/13/20 12:28 AM   Specimen: Nasopharyngeal Swab; Nasopharyngeal(NP) swabs in vial transport medium  Result Value Ref Range Status   SARS Coronavirus 2 by RT PCR NEGATIVE NEGATIVE Final    Comment: (NOTE) SARS-CoV-2 target nucleic acids are NOT DETECTED.  The SARS-CoV-2 RNA is generally detectable in upper respiratory specimens during the acute phase of infection. The lowest concentration of SARS-CoV-2 viral copies this assay can detect is 138 copies/mL. A negative result does not preclude SARS-Cov-2 infection and should not be used as the sole basis for treatment or other patient management decisions. A negative result may occur with  improper specimen collection/handling, submission of specimen other than nasopharyngeal swab, presence of viral mutation(s) within the areas targeted by this assay, and inadequate number of viral copies(<138 copies/mL). A negative result must be combined with clinical observations, patient history, and epidemiological information. The expected result is Negative.  Fact Sheet for Patients:  EntrepreneurPulse.com.au  Fact Sheet for Healthcare Providers:  IncredibleEmployment.be  This test is no t yet approved or cleared by the [place] FDA and  has been authorized for detection and/or diagnosis of  SARS-CoV-2 by FDA under an Emergency Use Authorization (EUA). This EUA will remain  in effect (meaning this test can be used) for the duration of the COVID-19 declaration under Section 564(b)(1) of the Act, 21 U.S.C.section 360bbb-3(b)(1), unless the authorization is terminated  or revoked sooner.       Influenza A by PCR NEGATIVE NEGATIVE Final   Influenza B by PCR NEGATIVE NEGATIVE Final    Comment: (NOTE) The Xpert Xpress SARS-CoV-2/FLU/RSV plus assay is intended as an aid in the diagnosis of influenza from Nasopharyngeal swab specimens and should not be used as a sole basis for treatment. Nasal washings and aspirates are unacceptable for Xpert Xpress SARS-CoV-2/FLU/RSV testing.  Fact Sheet for Patients: EntrepreneurPulse.com.au  Fact Sheet for Healthcare Providers: IncredibleEmployment.be  This test is not yet approved or cleared by the [place] FDA and has been authorized for detection and/or diagnosis of SARS-CoV-2 by FDA under an Emergency Use Authorization (EUA). This EUA will remain in effect (meaning this test can be used) for the duration of the COVID-19  declaration under Section 564(b)(1) of the Act, 21 U.S.C. section 360bbb-3(b)(1), unless the authorization is terminated or revoked.  Performed at [facility], [address], [place], [place] [address]   Surgical PCR screen     Status: None   Collection Time: 10/13/20  5:46 PM   Specimen: Nasal Mucosa; Nasal Swab  Result Value Ref Range Status   MRSA, PCR NEGATIVE NEGATIVE Final   Staphylococcus aureus NEGATIVE NEGATIVE Final    Comment: (NOTE) The Xpert SA Assay (FDA approved for NASAL specimens in patients 43 years of age and older), is one component of a comprehensive surveillance program. It is not intended to diagnose infection nor to guide or monitor treatment. Performed at [facility], [place] [address], [place], [place] [address]          Radiology  Studies: DG Chest 1 View  Result Date: 10/13/2020 CLINICAL DATA:  Left hip fracture EXAM: CHEST  1 VIEW COMPARISON:  None. FINDINGS: The heart size and mediastinal contours are within normal limits. Both lungs are clear. The visualized skeletal structures are unremarkable. IMPRESSION: No active disease. Electronically Signed   By: Fidela Salisbury MD   On: 10/13/2020 00:14   CT HIP LEFT WO CONTRAST  Result Date: 10/13/2020 CLINICAL DATA:  Left hip pain since a fall 10/11/2020. Initial encounter. EXAM: CT OF THE LEFT HIP WITHOUT CONTRAST TECHNIQUE: Multidetector CT imaging of the left hip was performed according to the standard protocol. Multiplanar CT image reconstructions were also generated. COMPARISON:  Plain films left hip 10/12/2020. CT of the pelvis 05/16/2018. FINDINGS: Bones/Joint/Cartilage As seen on the comparison plain films, the patient has a subcapital fracture of the left hip. Although the patient suffered a recent fall, the fracture appears remote with corticated fracture margins and remodeling of the femoral neck. The femoral head is located. The shaft of the femur is superiorly displaced approximately 1.5 cm. Remote healed left inferior pubic ramus fracture is seen. Left hip joint space is preserved. A small bone island in the left ilium is unchanged since 2019. No worrisome bony lesion is identified. Vacuum disc phenomenon at L4-5 is partially imaged. The patient is status post L5-S1 fusion. Ligaments Suboptimally assessed by CT. Muscles and Tendons Appear intact. Soft tissues No acute or focal abnormality. IMPRESSION: Subcapital fracture of the left hip is new since 2019 but the fracture appears remote as described above. Remote healed right inferior pubic ramus fracture. Degenerative disc disease L4-5. The patient is status post L5-S1 fusion. Electronically Signed   By: Inge Rise M.D.   On: 10/13/2020 07:23   DG Hip Unilat W or Wo Pelvis 2-3 Views Left  Result Date:  10/13/2020 CLINICAL DATA:  Fall, left hip pain EXAM: DG HIP (WITH OR WITHOUT PELVIS) 2-3V LEFT COMPARISON:  None. FINDINGS: Single view radiograph of the pelvis and two view radiograph of the left hip demonstrate an acute left subcapital femoral neck fracture with superolateral displacement and external rotation of the distal fracture fragment. The femoral head is still seated within the left acetabulum. Left hip joint space appears preserved. Limited evaluation of the right hip is unremarkable. Lumbosacral fusion with instrumentation has been performed, not well assessed. IMPRESSION: Acute, displaced, rotated left subcapital femoral neck fracture. Electronically Signed   By: Fidela Salisbury MD   On: 10/13/2020 00:13        Scheduled Meds: . enoxaparin (LOVENOX) injection  40 mg Subcutaneous Q24H   Continuous Infusions:        Kynslee Baham Starla Link,  MD [facility] 10/14/2020, 7:53 AM

## 2020-10-14 NOTE — TOC Initial Note (Incomplete)
Transition of Care [facility]) - Initial/Assessment Note    Patient Details  Name: Allison Berry MRN: [id number] Date of Birth: [birth year]  Transition of Care [facility]) CM/SW Contact:    Sharin Mons, RN Phone Number: [phone] 10/14/2020, 4:16 PM  Clinical Narrative:                 Presents after falling, pt with Left femoral neck fracture. Hx of  hypertension, tobacco abuse, hyperlipidemia, chronic back pain, depression.   Plan:surgery on 10/15/2020, THA   TOC team following and will assist with TOC needs....  Expected Discharge Plan: Home/Self Care Barriers to Discharge: Continued Medical Work up   Patient Goals and CMS Choice        Expected Discharge Plan and Services Expected Discharge Plan: Home/Self Care                                              Prior Living Arrangements/Services                       Activities of Daily Living Home Assistive Devices/Equipment: Environmental consultant (specify type),Eyeglasses,Dentures (specify type) ADL Screening (condition at time of admission) Patient's cognitive ability adequate to safely complete daily activities?: Yes Is the patient deaf or have difficulty hearing?: No Does the patient have difficulty seeing, even when wearing glasses/contacts?: No Does the patient have difficulty concentrating, remembering, or making decisions?: No Patient able to express need for assistance with ADLs?: Yes Does the patient have difficulty dressing or bathing?: Yes Independently performs ADLs?: Yes (appropriate for developmental age) Does the patient have difficulty walking or climbing stairs?: Yes Weakness of Legs: Left Weakness of Arms/Hands: None  Permission Sought/Granted                  Emotional Assessment              Admission diagnosis:  Fall [W19.XXXA] Closed left femoral fracture (HCC) [S72.92XA] Closed fracture of left hip, initial encounter [facility]) [S72.002A] Patient Active Problem List    Diagnosis Date Noted  . Left displaced femoral neck fracture ([place]) 10/13/2020  . Accidental fall 10/13/2020  . Leukocytosis 10/13/2020  . Normocytic anemia 10/13/2020  . Thrombocytosis 07/21/2018  . Altered mental status 07/20/2018  . AKI (acute kidney injury) ([place]) 07/20/2018  . Hypertension 07/20/2018  . Chronic back pain 07/20/2018  . Dyslipidemia 07/20/2018  . Hypokalemia 07/20/2018  . Hypomagnesemia 07/20/2018  . Hypokalemia 07/20/2018   PCP:  System, Provider Not In Pharmacy:   [facility] [address] [place] [address] Phone: [phone] Fax: [phone]     Social Determinants of Health (SDOH) Interventions    Readmission Risk Interventions No flowsheet data found.

## 2020-10-15 ENCOUNTER — Inpatient Hospital Stay (HOSPITAL_COMMUNITY): Payer: Medicaid Other | Admitting: Certified Registered"

## 2020-10-15 ENCOUNTER — Inpatient Hospital Stay (HOSPITAL_COMMUNITY): Payer: Medicaid Other

## 2020-10-15 ENCOUNTER — Encounter (HOSPITAL_COMMUNITY): Payer: Self-pay | Admitting: Internal Medicine

## 2020-10-15 ENCOUNTER — Encounter (HOSPITAL_COMMUNITY)
Admission: EM | Disposition: A | Discharge status: Home Health | Payer: Self-pay | Source: Home / Self Care | Attending: Internal Medicine

## 2020-10-15 DIAGNOSIS — S72002A Fracture of unspecified part of neck of left femur, initial encounter for closed fracture: Secondary | ICD-10-CM | POA: Diagnosis not present

## 2020-10-15 DIAGNOSIS — S72012A Unspecified intracapsular fracture of left femur, initial encounter for closed fracture: Secondary | ICD-10-CM

## 2020-10-15 HISTORY — PX: TOTAL HIP ARTHROPLASTY: SHX124

## 2020-10-15 LAB — CBC WITH DIFFERENTIAL/PLATELET
Abs Immature Granulocytes: 0.02 10*3/uL (ref 0.00–0.07)
Basophils Absolute: 0.1 10*3/uL (ref 0.0–0.1)
Basophils Relative: 1 %
Eosinophils Absolute: 0.2 10*3/uL (ref 0.0–0.5)
Eosinophils Relative: 2 %
HCT: 32.5 % — ABNORMAL LOW (ref 36.0–46.0)
Hemoglobin: 11.1 g/dL — ABNORMAL LOW (ref 12.0–15.0)
Immature Granulocytes: 0 %
Lymphocytes Relative: 35 %
Lymphs Abs: 2.9 10*3/uL (ref 0.7–4.0)
MCH: 29.6 pg (ref 26.0–34.0)
MCHC: 34.2 g/dL (ref 30.0–36.0)
MCV: 86.7 fL (ref 80.0–100.0)
Monocytes Absolute: 0.9 10*3/uL (ref 0.1–1.0)
Monocytes Relative: 11 %
Neutro Abs: 4.2 10*3/uL (ref 1.7–7.7)
Neutrophils Relative %: 51 %
Platelets: 565 10*3/uL — ABNORMAL HIGH (ref 150–400)
RBC: 3.75 MIL/uL — ABNORMAL LOW (ref 3.87–5.11)
RDW: 13 % (ref 11.5–15.5)
WBC: 8.3 10*3/uL (ref 4.0–10.5)
nRBC: 0 % (ref 0.0–0.2)

## 2020-10-15 LAB — BASIC METABOLIC PANEL
Anion gap: 10 (ref 5–15)
BUN: 8 mg/dL (ref 6–20)
CO2: 25 mmol/L (ref 22–32)
Calcium: 9.3 mg/dL (ref 8.9–10.3)
Chloride: 100 mmol/L (ref 98–111)
Creatinine, Ser: 0.57 mg/dL (ref 0.44–1.00)
GFR, Estimated: >60 mL/min (ref >60)
Glucose, Bld: 95 mg/dL (ref 70–99)
Potassium: 3.5 mmol/L (ref 3.5–5.1)
Sodium: 135 mmol/L (ref 135–145)

## 2020-10-15 LAB — MAGNESIUM: Magnesium: 1.9 mg/dL (ref 1.7–2.4)

## 2020-10-15 SURGERY — ARTHROPLASTY, HIP, TOTAL, ANTERIOR APPROACH
Anesthesia: General | Site: Hip | Laterality: Left

## 2020-10-15 MED ORDER — METHOCARBAMOL 1000 MG/10ML IJ SOLN
500.0000 mg | Freq: Four times a day (QID) | INTRAVENOUS | Status: DC | PRN
  Filled 2020-10-15 (×2): qty 5

## 2020-10-15 MED ORDER — TRANEXAMIC ACID 1000 MG/10ML IV SOLN
INTRAVENOUS | Status: DC | PRN
  Administered 2020-10-15: 2000 mg via TOPICAL

## 2020-10-15 MED ORDER — OXYCODONE-ACETAMINOPHEN 5-325 MG PO TABS: 1.0000 | ORAL_TABLET | Freq: Three times a day (TID) | ORAL | 0 refills | Status: DC | PRN

## 2020-10-15 MED ORDER — CHLORHEXIDINE GLUCONATE 0.12 % MT SOLN: 15.0000 mL | Freq: Once | OROMUCOSAL | Status: AC

## 2020-10-15 MED ORDER — SODIUM CHLORIDE 0.9 % IR SOLN
Status: DC | PRN
  Administered 2020-10-15: 1000 mL

## 2020-10-15 MED ORDER — OXYCODONE HCL 5 MG/5ML PO SOLN: 5.0000 mg | Freq: Once | ORAL | Status: AC | PRN

## 2020-10-15 MED ORDER — BUPIVACAINE-MELOXICAM ER 400-12 MG/14ML IJ SOLN
INTRAMUSCULAR | Status: DC | PRN
  Administered 2020-10-15: 14 mL

## 2020-10-15 MED ORDER — ORAL CARE MOUTH RINSE: 15.0000 mL | Freq: Once | OROMUCOSAL | Status: AC

## 2020-10-15 MED ORDER — ACETAMINOPHEN 325 MG PO TABS
325.0000 mg | ORAL_TABLET | Freq: Four times a day (QID) | ORAL | Status: DC | PRN
Start: 2020-10-16 — End: 2020-10-16

## 2020-10-15 MED ORDER — METHOCARBAMOL 500 MG PO TABS
500.0000 mg | ORAL_TABLET | Freq: Four times a day (QID) | ORAL | Status: DC | PRN
  Administered 2020-10-15 – 2020-10-19 (×5): 500 mg via ORAL
  Filled 2020-10-15 (×4): qty 1

## 2020-10-15 MED ORDER — ROCURONIUM BROMIDE 10 MG/ML (PF) SYRINGE
PREFILLED_SYRINGE | INTRAVENOUS | Status: AC
  Filled 2020-10-15: qty 10

## 2020-10-15 MED ORDER — ALUM & MAG HYDROXIDE-SIMETH 200-200-20 MG/5ML PO SUSP: 30.0000 mL | ORAL | Status: DC | PRN

## 2020-10-15 MED ORDER — MAGNESIUM CITRATE PO SOLN: 1.0000 | Freq: Once | ORAL | Status: DC | PRN

## 2020-10-15 MED ORDER — DEXAMETHASONE SODIUM PHOSPHATE 10 MG/ML IJ SOLN
INTRAMUSCULAR | Status: DC | PRN
  Administered 2020-10-15: 4 mg via INTRAVENOUS

## 2020-10-15 MED ORDER — ROCURONIUM BROMIDE 10 MG/ML (PF) SYRINGE
PREFILLED_SYRINGE | INTRAVENOUS | Status: DC | PRN
  Administered 2020-10-15: 50 mg via INTRAVENOUS
  Administered 2020-10-15: 30 mg via INTRAVENOUS

## 2020-10-15 MED ORDER — MIDAZOLAM HCL 2 MG/2ML IJ SOLN
INTRAMUSCULAR | Status: AC
  Filled 2020-10-15: qty 2

## 2020-10-15 MED ORDER — OXYCODONE HCL 5 MG PO TABS
ORAL_TABLET | ORAL | Status: AC
  Filled 2020-10-15: qty 1

## 2020-10-15 MED ORDER — METHOCARBAMOL 1000 MG/10ML IJ SOLN
500.0000 mg | Freq: Four times a day (QID) | INTRAVENOUS | Status: DC | PRN
  Filled 2020-10-15: qty 5

## 2020-10-15 MED ORDER — OXYCODONE HCL 5 MG PO TABS
5.0000 mg | ORAL_TABLET | Freq: Once | ORAL | Status: AC | PRN
  Administered 2020-10-15: 5 mg via ORAL

## 2020-10-15 MED ORDER — OXYCODONE HCL ER 10 MG PO T12A
10.0000 mg | EXTENDED_RELEASE_TABLET | Freq: Two times a day (BID) | ORAL | Status: DC
  Administered 2020-10-15 – 2020-10-19 (×8): 10 mg via ORAL
  Filled 2020-10-15 (×10): qty 1

## 2020-10-15 MED ORDER — FENTANYL CITRATE (PF) 250 MCG/5ML IJ SOLN
INTRAMUSCULAR | Status: DC | PRN
  Administered 2020-10-15: 50 ug via INTRAVENOUS
  Administered 2020-10-15: 100 ug via INTRAVENOUS
  Administered 2020-10-15 (×2): 50 ug via INTRAVENOUS

## 2020-10-15 MED ORDER — PHENOL 1.4 % MT LIQD: 1.0000 | OROMUCOSAL | Status: DC | PRN

## 2020-10-15 MED ORDER — NICOTINE 14 MG/24HR TD PT24
14.0000 mg | MEDICATED_PATCH | Freq: Every day | TRANSDERMAL | Status: DC
  Administered 2020-10-15 – 2020-10-19 (×5): 14 mg via TRANSDERMAL
  Filled 2020-10-15 (×5): qty 1

## 2020-10-15 MED ORDER — DOCUSATE SODIUM 100 MG PO CAPS
100.0000 mg | ORAL_CAPSULE | Freq: Two times a day (BID) | ORAL | Status: DC
  Administered 2020-10-15 – 2020-10-19 (×8): 100 mg via ORAL
  Filled 2020-10-15 (×8): qty 1

## 2020-10-15 MED ORDER — PHENYLEPHRINE 40 MCG/ML (10ML) SYRINGE FOR IV PUSH (FOR BLOOD PRESSURE SUPPORT)
PREFILLED_SYRINGE | INTRAVENOUS | Status: AC
  Filled 2020-10-15: qty 10

## 2020-10-15 MED ORDER — VANCOMYCIN HCL 1 G IV SOLR
INTRAVENOUS | Status: DC | PRN
  Administered 2020-10-15: 1000 mg via TOPICAL

## 2020-10-15 MED ORDER — PROPOFOL 10 MG/ML IV BOLUS
INTRAVENOUS | Status: AC
  Filled 2020-10-15: qty 20

## 2020-10-15 MED ORDER — OXYCODONE HCL 5 MG PO TABS
10.0000 mg | ORAL_TABLET | ORAL | Status: DC | PRN
  Administered 2020-10-15 – 2020-10-16 (×5): 15 mg via ORAL
  Administered 2020-10-17: 10 mg via ORAL
  Administered 2020-10-17 – 2020-10-18 (×7): 15 mg via ORAL
  Administered 2020-10-19 (×3): 10 mg via ORAL
  Filled 2020-10-15 (×12): qty 3

## 2020-10-15 MED ORDER — ONDANSETRON HCL 4 MG/2ML IJ SOLN
INTRAMUSCULAR | Status: AC
  Filled 2020-10-15: qty 2

## 2020-10-15 MED ORDER — FENTANYL CITRATE (PF) 250 MCG/5ML IJ SOLN
INTRAMUSCULAR | Status: AC
  Filled 2020-10-15: qty 5

## 2020-10-15 MED ORDER — MIDAZOLAM HCL 2 MG/2ML IJ SOLN
INTRAMUSCULAR | Status: DC | PRN
  Administered 2020-10-15: 2 mg via INTRAVENOUS

## 2020-10-15 MED ORDER — PROPOFOL 10 MG/ML IV BOLUS
INTRAVENOUS | Status: DC | PRN
  Administered 2020-10-15: 130 mg via INTRAVENOUS

## 2020-10-15 MED ORDER — SUGAMMADEX SODIUM 200 MG/2ML IV SOLN
INTRAVENOUS | Status: DC | PRN
  Administered 2020-10-15: 120 mg via INTRAVENOUS

## 2020-10-15 MED ORDER — HYDROMORPHONE HCL 1 MG/ML IJ SOLN
0.5000 mg | INTRAMUSCULAR | Status: DC | PRN
  Administered 2020-10-16: 1 mg via INTRAVENOUS
  Administered 2020-10-16: 0.5 mg via INTRAVENOUS
  Administered 2020-10-17 – 2020-10-19 (×6): 1 mg via INTRAVENOUS
  Filled 2020-10-15 (×10): qty 1

## 2020-10-15 MED ORDER — MENTHOL 3 MG MT LOZG: 1.0000 | LOZENGE | OROMUCOSAL | Status: DC | PRN

## 2020-10-15 MED ORDER — VANCOMYCIN HCL 1000 MG IV SOLR
INTRAVENOUS | Status: AC
  Filled 2020-10-15: qty 1000

## 2020-10-15 MED ORDER — LACTATED RINGERS IV SOLN: INTRAVENOUS | Status: DC

## 2020-10-15 MED ORDER — TRANEXAMIC ACID-NACL 1000-0.7 MG/100ML-% IV SOLN
1000.0000 mg | INTRAVENOUS | Status: AC
  Administered 2020-10-15: 1000 mg via INTRAVENOUS
  Filled 2020-10-15: qty 100

## 2020-10-15 MED ORDER — METHOCARBAMOL 500 MG PO TABS
ORAL_TABLET | ORAL | Status: AC
  Filled 2020-10-15: qty 1

## 2020-10-15 MED ORDER — BUPIVACAINE-MELOXICAM ER 400-12 MG/14ML IJ SOLN
INTRAMUSCULAR | Status: AC
  Filled 2020-10-15: qty 1

## 2020-10-15 MED ORDER — TRANEXAMIC ACID 1000 MG/10ML IV SOLN
2000.0000 mg | INTRAVENOUS | Status: DC
  Filled 2020-10-15 (×2): qty 20

## 2020-10-15 MED ORDER — PHENYLEPHRINE 40 MCG/ML (10ML) SYRINGE FOR IV PUSH (FOR BLOOD PRESSURE SUPPORT)
PREFILLED_SYRINGE | INTRAVENOUS | Status: DC | PRN
  Administered 2020-10-15: 80 ug via INTRAVENOUS

## 2020-10-15 MED ORDER — SODIUM CHLORIDE 0.9 % IV SOLN: INTRAVENOUS | Status: DC

## 2020-10-15 MED ORDER — ACETAMINOPHEN 500 MG PO TABS
1000.0000 mg | ORAL_TABLET | Freq: Four times a day (QID) | ORAL | Status: AC
  Administered 2020-10-15 – 2020-10-16 (×4): 1000 mg via ORAL
  Filled 2020-10-15 (×4): qty 2

## 2020-10-15 MED ORDER — ONDANSETRON HCL 4 MG/2ML IJ SOLN: 4.0000 mg | Freq: Four times a day (QID) | INTRAMUSCULAR | Status: DC | PRN

## 2020-10-15 MED ORDER — LIDOCAINE 2% (20 MG/ML) 5 ML SYRINGE
INTRAMUSCULAR | Status: AC
  Filled 2020-10-15: qty 5

## 2020-10-15 MED ORDER — TRANEXAMIC ACID-NACL 1000-0.7 MG/100ML-% IV SOLN
INTRAVENOUS | Status: AC
  Filled 2020-10-15: qty 100

## 2020-10-15 MED ORDER — OXYCODONE HCL 5 MG PO TABS
5.0000 mg | ORAL_TABLET | ORAL | Status: DC | PRN
Start: 2020-10-15 — End: 2020-10-20
  Filled 2020-10-15 (×4): qty 2

## 2020-10-15 MED ORDER — LIDOCAINE 2% (20 MG/ML) 5 ML SYRINGE
INTRAMUSCULAR | Status: DC | PRN
  Administered 2020-10-15: 50 mg via INTRAVENOUS

## 2020-10-15 MED ORDER — ONDANSETRON HCL 4 MG/2ML IJ SOLN
INTRAMUSCULAR | Status: DC | PRN
  Administered 2020-10-15: 4 mg via INTRAVENOUS

## 2020-10-15 MED ORDER — ENOXAPARIN SODIUM 40 MG/0.4ML ~~LOC~~ SOLN
40.0000 mg | SUBCUTANEOUS | Status: DC
  Administered 2020-10-16 – 2020-10-19 (×4): 40 mg via SUBCUTANEOUS
  Filled 2020-10-15 (×4): qty 0.4

## 2020-10-15 MED ORDER — POLYETHYLENE GLYCOL 3350 17 G PO PACK: 17.0000 g | PACK | Freq: Every day | ORAL | Status: DC | PRN

## 2020-10-15 MED ORDER — SORBITOL 70 % SOLN
30.0000 mL | Freq: Every day | Status: DC | PRN
  Filled 2020-10-15: qty 30

## 2020-10-15 MED ORDER — IRRISEPT - 450ML BOTTLE WITH 0.05% CHG IN STERILE WATER, USP 99.95% OPTIME
TOPICAL | Status: DC | PRN
  Administered 2020-10-15: 450 mL via TOPICAL

## 2020-10-15 MED ORDER — POVIDONE-IODINE 10 % EX SWAB: 2.0000 "application " | Freq: Once | CUTANEOUS | Status: DC

## 2020-10-15 MED ORDER — FENTANYL CITRATE (PF) 100 MCG/2ML IJ SOLN
INTRAMUSCULAR | Status: AC
  Filled 2020-10-15: qty 2

## 2020-10-15 MED ORDER — CHLORHEXIDINE GLUCONATE 0.12 % MT SOLN
OROMUCOSAL | Status: AC
  Administered 2020-10-15: 15 mL via OROMUCOSAL
  Filled 2020-10-15: qty 15

## 2020-10-15 MED ORDER — CEFAZOLIN SODIUM-DEXTROSE 2-4 GM/100ML-% IV SOLN
2.0000 g | Freq: Four times a day (QID) | INTRAVENOUS | Status: AC
Start: 2020-10-15 — End: 2020-10-16
  Administered 2020-10-15 – 2020-10-16 (×3): 2 g via INTRAVENOUS
  Filled 2020-10-15 (×3): qty 100

## 2020-10-15 MED ORDER — ENOXAPARIN SODIUM 40 MG/0.4ML ~~LOC~~ SOLN: 40.0000 mg | Freq: Every day | SUBCUTANEOUS | 0 refills | Status: DC

## 2020-10-15 MED ORDER — ONDANSETRON HCL 4 MG PO TABS: 4.0000 mg | ORAL_TABLET | Freq: Four times a day (QID) | ORAL | Status: DC | PRN

## 2020-10-15 MED ORDER — FENTANYL CITRATE (PF) 100 MCG/2ML IJ SOLN
25.0000 ug | INTRAMUSCULAR | Status: DC | PRN
  Administered 2020-10-15: 50 ug via INTRAVENOUS
  Administered 2020-10-15: 25 ug via INTRAVENOUS

## 2020-10-15 MED ORDER — CEFAZOLIN SODIUM-DEXTROSE 2-4 GM/100ML-% IV SOLN
2.0000 g | INTRAVENOUS | Status: AC
  Administered 2020-10-15: 2 g via INTRAVENOUS
  Filled 2020-10-15: qty 100

## 2020-10-15 MED ORDER — DEXAMETHASONE SODIUM PHOSPHATE 10 MG/ML IJ SOLN
INTRAMUSCULAR | Status: AC
  Filled 2020-10-15: qty 1

## 2020-10-15 SURGICAL SUPPLY — 63 items
BAG DECANTER FOR FLEXI CONT (MISCELLANEOUS) ×2 IMPLANT
CELLS DAT CNTRL 66122 CELL SVR (MISCELLANEOUS) IMPLANT
COVER PERINEAL POST (MISCELLANEOUS) ×2 IMPLANT
COVER SURGICAL LIGHT HANDLE (MISCELLANEOUS) ×2 IMPLANT
COVER WAND RF STERILE (DRAPES) ×2 IMPLANT
DRAPE C-ARM 42X72 X-RAY (DRAPES) ×2 IMPLANT
DRAPE POUCH INSTRU U-SHP 10X18 (DRAPES) ×2 IMPLANT
DRAPE STERI IOBAN 125X83 (DRAPES) ×2 IMPLANT
DRAPE U-SHAPE 47X51 STRL (DRAPES) ×4 IMPLANT
DRSG AQUACEL AG ADV 3.5X10 (GAUZE/BANDAGES/DRESSINGS) ×2 IMPLANT
DURAPREP 26ML APPLICATOR (WOUND CARE) ×4 IMPLANT
ELECT BLADE 4.0 EZ CLEAN MEGAD (MISCELLANEOUS) ×2
ELECT REM PT RETURN 9FT ADLT (ELECTROSURGICAL) ×2
ELECTRODE BLDE 4.0 EZ CLN MEGD (MISCELLANEOUS) ×1 IMPLANT
ELECTRODE REM PT RTRN 9FT ADLT (ELECTROSURGICAL) ×1 IMPLANT
GLOVE ECLIPSE 7.0 STRL STRAW (GLOVE) ×4 IMPLANT
GLOVE SKINSENSE NS SZ7.5 (GLOVE) ×1
GLOVE SKINSENSE STRL SZ7.5 (GLOVE) ×1 IMPLANT
GLOVE SURG SYN 7.5  E (GLOVE) ×4
GLOVE SURG SYN 7.5 E (GLOVE) ×4 IMPLANT
GLOVE SURG SYN 7.5 PF PI (GLOVE) ×4 IMPLANT
GLOVE SURG UNDER POLY LF SZ7 (GLOVE) ×2 IMPLANT
GOWN STRL REIN XL XLG (GOWN DISPOSABLE) ×2 IMPLANT
GOWN STRL REUS W/ TWL LRG LVL3 (GOWN DISPOSABLE) IMPLANT
GOWN STRL REUS W/ TWL XL LVL3 (GOWN DISPOSABLE) ×1 IMPLANT
GOWN STRL REUS W/TWL LRG LVL3 (GOWN DISPOSABLE) ×2
GOWN STRL REUS W/TWL XL LVL3 (GOWN DISPOSABLE) ×1
HANDPIECE INTERPULSE COAX TIP (DISPOSABLE) ×1
HEAD CERAMIC 36 PLUS5 (Hips) ×1 IMPLANT
HOOD PEEL AWAY FLYTE STAYCOOL (MISCELLANEOUS) ×5 IMPLANT
IV NS IRRIG 3000ML ARTHROMATIC (IV SOLUTION) ×2 IMPLANT
JET LAVAGE IRRISEPT WOUND (IRRIGATION / IRRIGATOR) ×2
KIT BASIN OR (CUSTOM PROCEDURE TRAY) ×2 IMPLANT
LAVAGE JET IRRISEPT WOUND (IRRIGATION / IRRIGATOR) ×1 IMPLANT
LINER NEUTRAL 52X36MM PLUS 4 (Liner) ×1 IMPLANT
MARKER SKIN DUAL TIP RULER LAB (MISCELLANEOUS) ×2 IMPLANT
NDL SPNL 18GX3.5 QUINCKE PK (NEEDLE) ×1 IMPLANT
NEEDLE SPNL 18GX3.5 QUINCKE PK (NEEDLE) ×2 IMPLANT
PACK TOTAL JOINT (CUSTOM PROCEDURE TRAY) ×2 IMPLANT
PACK UNIVERSAL I (CUSTOM PROCEDURE TRAY) ×2 IMPLANT
PIN SECTOR W/GRIP ACE CUP 52MM (Hips) ×1 IMPLANT
RETRACTOR WND ALEXIS 18 MED (MISCELLANEOUS) IMPLANT
RTRCTR WOUND ALEXIS 18CM MED (MISCELLANEOUS)
SAW OSC TIP CART 19.5X105X1.3 (SAW) ×2 IMPLANT
SCREW 6.5MMX30MM (Screw) ×1 IMPLANT
SET HNDPC FAN SPRY TIP SCT (DISPOSABLE) ×1 IMPLANT
STAPLER VISISTAT 35W (STAPLE) IMPLANT
STEM FEMORAL SZ 5MM STD ACTIS (Stem) ×1 IMPLANT
SUT ETHIBOND 2 V 37 (SUTURE) ×2 IMPLANT
SUT ETHILON 2 0 FS 18 (SUTURE) ×2 IMPLANT
SUT ETHILON 2 0 PSLX (SUTURE) ×1 IMPLANT
SUT VIC AB 0 CT1 27 (SUTURE) ×1
SUT VIC AB 0 CT1 27XBRD ANBCTR (SUTURE) ×1 IMPLANT
SUT VIC AB 1 CTX 36 (SUTURE) ×1
SUT VIC AB 1 CTX36XBRD ANBCTR (SUTURE) ×1 IMPLANT
SUT VIC AB 2-0 CT1 27 (SUTURE) ×3
SUT VIC AB 2-0 CT1 TAPERPNT 27 (SUTURE) ×2 IMPLANT
SYR 50ML LL SCALE MARK (SYRINGE) ×2 IMPLANT
TOWEL GREEN STERILE (TOWEL DISPOSABLE) ×2 IMPLANT
TRAY CATH 16FR W/PLASTIC CATH (SET/KITS/TRAYS/PACK) ×1 IMPLANT
TRAY FOLEY W/BAG SLVR 16FR (SET/KITS/TRAYS/PACK) ×1
TRAY FOLEY W/BAG SLVR 16FR ST (SET/KITS/TRAYS/PACK) ×1 IMPLANT
YANKAUER SUCT BULB TIP NO VENT (SUCTIONS) ×3 IMPLANT

## 2020-10-15 NOTE — Op Note (Signed)
TOTAL HIP ARTHROPLASTY ANTERIOR APPROACH  Procedure Note Thresea Doble   [id number]  Pre-op Diagnosis: Subacute Left Femoral neck Fracture     Post-op Diagnosis: same   Operative Procedures  1. Total hip replacement; Left hip; uncemented [id number]   Surgeon: Frankey Shown, M.D.  Assist: Madalyn Rob, PA-C   Anesthesia: general  Prosthesis: Depuy Acetabulum: Pinnacle 52 mm Femur: Actis 5 STD Head: 36 mm size: +5 Liner: +4 Bearing Type: ceramic on poly  Total Hip Arthroplasty (Anterior Approach) Op Note:  After informed consent was obtained and the operative extremity marked in the holding area, the patient was brought back to the operating room and placed supine on the HANA table. Next, the operative extremity was prepped and draped in normal sterile fashion. Surgical timeout occurred verifying patient identification, surgical site, surgical procedure and administration of antibiotics.  A modified anterior Smith-Peterson approach to the hip was performed, using the interval between tensor fascia lata and sartorius.  Dissection was carried bluntly down onto the anterior hip capsule. The lateral femoral circumflex vessels were identified and coagulated. A capsulotomy was performed and there was an effusion but it was not bloody.  The capsular flaps tagged for later repair.  The neck osteotomy was performed 1 cm above the lesser trochanter. The femoral head and neck were removed, the acetabular rim was cleared of soft tissue and attention was turned to reaming the acetabulum.  Inspection of the femoral head and neck showed that the fracture was not acute.   Sequential reaming was performed under fluoroscopic guidance. We reamed to a size 51 mm, and then impacted the acetabular shell. A 30 mm cancellous screw was placed through the shell for added fixation.  The liner was then placed after irrigation and attention turned to the femur.  After placing the femoral hook, the leg was taken  to externally rotated, extended and adducted position taking care to perform soft tissue releases to allow for adequate mobilization of the femur. Soft tissue was cleared from the shoulder of the greater trochanter and the hook elevator used to improve exposure of the proximal femur. Sequential broaching performed up to a size 5. Trial neck and head were placed. The leg was brought back up to neutral and the construct reduced.  Antibiotic irrigation was placed in the surgical wound and kept for at least 1 minute.  The position and sizing of components, offset and leg lengths were checked using fluoroscopy. Stability of the construct was checked in extension and external rotation without any subluxation or impingement of prosthesis. We dislocated the prosthesis, dropped the leg back into position, removed trial components, and irrigated copiously. The final stem and head was then placed, the leg brought back up, the system reduced and fluoroscopy used to verify positioning.  We irrigated, obtained hemostasis and closed the capsule using #2 ethibond suture.  One gram of vancomycin powder was placed in the surgical bed.  One gram of topical tranexamic acid was injected into the joint.  The fascia was closed with #1 vicryl plus, the deep fat layer was closed with 0 vicryl, the subcutaneous layers closed with 2.0 Vicryl Plus and the skin closed with 2.0 nylon and dermabond. A sterile dressing was applied. The patient was awakened in the operating room and taken to recovery in stable condition.  All sponge, needle, and instrument counts were correct at the end of the case.   Tawanna Cooler, my PA, was a medical necessity for opening, closing, limb positioning, retracting, exposing,  and overall facilitation and timely completion of the surgery.  Position: supine  Complications: see description of procedure.  Time Out: performed   Drains/Packing: none  Estimated blood loss: see anesthesia record  Returned to  Recovery Room: in good condition.   Antibiotics: yes   Mechanical VTE (DVT) Prophylaxis: sequential compression devices, TED thigh-high  Chemical VTE (DVT) Prophylaxis: lovenox   Fluid Replacement: see anesthesia record  Specimens Removed: 1 to pathology   Sponge and Instrument Count Correct? yes   PACU: portable radiograph - low AP   Plan/RTC: Return in 2 weeks for staple removal. Weight Bearing/Load Lower Extremity: full  Hip precautions: none Suture Removal: 2 weeks   N. Eduard Roux, MD [facility] 9:34 PM   Implant Name Type Inv. Item Serial No. Manufacturer Lot No. LRB No. Used Action  PIN SECTOR W/GRIP ACE CUP 52MM - [id number] Hips PIN SECTOR W/GRIP ACE CUP 52MM  [employer] [id number] Left 1 Implanted  LINER NEUTRAL 52X36MM PLUS 4 - [id number] Liner LINER NEUTRAL 52X36MM PLUS 4  [employer] [id number] Left 1 Implanted  SCREW 6.5MMX30MM - [id number] Screw SCREW 6.5MMX30MM  [employer] [id number] Left 1 Implanted  STEM FEMORAL SZ 5MM STD ACTIS - [id number] Stem STEM FEMORAL SZ 5MM STD ACTIS  [employer]PU[employer] [id number] Left 1 Implanted  HEAD CERAMIC 36 PLUS5 - [id number] Hips HEAD CERAMIC 36 PLUS5  [employer] [id number] Left 1 Implanted

## 2020-10-15 NOTE — H&P (Signed)

## 2020-10-15 NOTE — Anesthesia Preprocedure Evaluation (Signed)
Anesthesia Evaluation  Patient identified by MRN, date of birth, ID band Patient awake    Reviewed: Allergy & Precautions, H&P , NPO status , Patient's Chart, lab work & pertinent test results  Airway Mallampati: II   Neck ROM: full    Dental   Pulmonary Current Smoker,    breath sounds clear to auscultation       Cardiovascular hypertension,  Rhythm:regular Rate:Normal     Neuro/Psych    GI/Hepatic   Endo/Other    Renal/GU      Musculoskeletal   Abdominal   Peds  Hematology   Anesthesia Other Findings   Reproductive/Obstetrics                             Anesthesia Physical Anesthesia Plan  ASA: II  Anesthesia Plan: General   Post-op Pain Management:    Induction: Intravenous  PONV Risk Score and Plan: 2 and Ondansetron, Dexamethasone, Midazolam and Treatment may vary due to age or medical condition  Airway Management Planned: Oral ETT  Additional Equipment:   Intra-op Plan:   Post-operative Plan: Extubation in OR  Informed Consent: I have reviewed the patients History and Physical, chart, labs and discussed the procedure including the risks, benefits and alternatives for the proposed anesthesia with the patient or authorized representative who has indicated his/her understanding and acceptance.     Dental advisory given  Plan Discussed with: CRNA, Anesthesiologist and Surgeon  Anesthesia Plan Comments:         Anesthesia Quick Evaluation

## 2020-10-15 NOTE — Anesthesia Procedure Notes (Signed)
Procedure Name: Intubation Date/Time: 10/15/2020 3:15 PM Performed by: Barrington Ellison, CRNA Pre-anesthesia Checklist: Patient identified, Emergency Drugs available, Suction available and Patient being monitored Patient Re-evaluated:Patient Re-evaluated prior to induction Oxygen Delivery Method: Circle System Utilized Preoxygenation: Pre-oxygenation with 100% oxygen Induction Type: IV induction Ventilation: Mask ventilation without difficulty Laryngoscope Size: Mac and 3 Grade View: Grade I Tube type: Oral Tube size: 7.0 mm Number of attempts: 1 Airway Equipment and Method: Stylet and Oral airway Placement Confirmation: ETT inserted through vocal cords under direct vision,  positive ETCO2 and breath sounds checked- equal and bilateral Secured at: 21 cm Tube secured with: Tape Dental Injury: Teeth and Oropharynx as per pre-operative assessment

## 2020-10-15 NOTE — Transfer of Care (Signed)
Immediate Anesthesia Transfer of Care Note  Patient: Allison Berry  Procedure(s) Performed: TOTAL HIP ARTHROPLASTY ANTERIOR APPROACH (Left Hip)  Patient Location: PACU  Anesthesia Type:General  Level of Consciousness: awake, alert  and oriented  Airway & Oxygen Therapy: Patient Spontanous Breathing  Post-op Assessment: Report given to RN  Post vital signs: Reviewed and stable  Last Vitals:  Vitals Value Taken Time  BP 157/90   Temp    Pulse 110 10/15/20 1706  Resp 31 10/15/20 1706  SpO2 98 % 10/15/20 1706  Vitals shown include unvalidated device data.  Last Pain:  Vitals:   10/15/20 1402  TempSrc:   PainSc: 8       Patients Stated Pain Goal: 3 (97/35/32 9924)  Complications: No complications documented.

## 2020-10-15 NOTE — Plan of Care (Signed)

## 2020-10-15 NOTE — Progress Notes (Signed)
PROGRESS NOTE    Kenecia Barren  TDD:[id number] DOB: Sep 20, 1965 DOA: 10/12/2020 PCP: System, Provider Not In   Brief Narrative:   55 year old female with a history of hypertension, tobacco abuse, hyperlipidemia, chronic back pain, depression presented to hospital with left hip pain after sustaining a mechanical fall.  In the ED patient was noted to have a left subcapital fracture of the hip.  Orthopedics was consulted and patient was admitted hospital for further evaluation and treatment.  Assessment & Plan:   Left subcapital femur fracture status post Mechanical fall Orthopedics on board.  Currently NPO.  Plan for surgery today.  Continue supportive care.  Fall precautions.  Hypokalemia Improved after replacement.  Latest potassium 3.5.  Leukocytosis--Reactive.  Resolved.  Essential hypertension Add IV hydralazine.  Lisinopril hydrochlorothiazide on hold.    Hyperlipidemia -Continue TriCor  Depression/anxiety On Cymbalta at home  Tobacco abuse Not on nicotine patch at this time we will add a nicotine patch.  Thrombocytosis Likely reactive.  Will monitor   DVT prophylaxis: Holding Lovenox prior to surgery  Code Status: Full  Family Communication:  None today.  Disposition Plan: Status is: Inpatient  Remains inpatient appropriate because:Inpatient level of care appropriate due to severity of illness  Dispo: The patient is from: Home              Anticipated d/c is to: Home/rehabilitation depending upon PT evaluation.              Patient currently is not medically stable to d/c.   Difficult to place patient No   Consultants:  Orthopedics  Procedures:  None so far  Antimicrobials: None   Subjective: Today, patient was seen and examined at bedside.  Complains of severe hip pain and spasm.  Denies any fever chills nausea vomiting.   Objective: Vitals:   10/14/20 1502 10/14/20 2025 10/15/20 0636 10/15/20 0853  BP: (!) 131/91 (!) 109/93 121/76 (!)  124/105  Pulse: 96 96 90 98  Resp: 15 18 18 20   Temp: 98.5 F (36.9 C) 98.8 F (37.1 C) 98.4 F (36.9 C) 98.4 F (36.9 C)  TempSrc: Oral Oral Oral Oral  SpO2: 92% 94% 93% 95%  Weight:      Height:        Intake/Output Summary (Last 24 hours) at 10/15/2020 1243 Last data filed at 10/15/2020 6237 Gross per 24 hour  Intake 240 ml  Output 1900 ml  Net -1660 ml   Filed Weights   10/12/20 2324  Weight: 64.4 kg    Physical examination:  General:  Average built, not in obvious distress HENT:   No scleral pallor or icterus noted. Oral mucosa is moist.  Chest:    Diminished breath sounds bilaterally. No crackles or wheezes.  CVS: S1 &S2 heard. No murmur.  Regular rate and rhythm. Abdomen: Soft, nontender, nondistended.  Bowel sounds are heard.   Extremities: No cyanosis, clubbing or edema.  Peripheral pulses are palpable.  Left lower extremity externally rotated.  Hip tenderness on palpation. Psych: Alert, awake and oriented, normal mood CNS:  No cranial nerve deficits.  Power equal in all extremities.   Skin: Warm and dry.  No rashes noted.  Data Reviewed: I have personally reviewed following labs and imaging studies  CBC: Recent Labs  Lab 10/13/20 0026 10/14/20 0306 10/15/20 0701  WBC 11.3* 9.3 8.3  NEUTROABS 8.6*  --  4.2  HGB 11.6* 11.3* 11.1*  HCT 35.6* 32.8* 32.5*  MCV 90.8 87.2 86.7  PLT 549* 532* 565*  Basic Metabolic Panel: Recent Labs  Lab 10/13/20 0026 10/13/20 0405 10/14/20 0306 10/15/20 0701  NA 135  --  135 135  K 2.8*  --  4.1 3.5  CL 100  --  101 100  CO2 23  --  26 25  GLUCOSE 116*  --  112* 95  BUN 12  --  7 8  CREATININE 0.71  --  0.63 0.57  CALCIUM 9.3  --  9.7 9.3  MG  --  1.8 1.9 1.9  PHOS  --  3.1  --   --    GFR: Estimated Creatinine Clearance: 76.1 mL/min (by C-G formula based on SCr of 0.57 mg/dL). Liver Function Tests: Recent Labs  Lab 10/14/20 0306  AST 34  ALT 34  ALKPHOS 73  BILITOT 0.6  PROT 6.6  ALBUMIN 3.1*   No  results for input(s): LIPASE, AMYLASE in the last 168 hours. No results for input(s): AMMONIA in the last 168 hours. Coagulation Profile: Recent Labs  Lab 10/13/20 0026 10/14/20 0306  INR 1.0 1.0   Cardiac Enzymes: No results for input(s): CKTOTAL, CKMB, CKMBINDEX, TROPONINI in the last 168 hours. BNP (last 3 results) No results for input(s): PROBNP in the last 8760 hours. HbA1C: No results for input(s): HGBA1C in the last 72 hours. CBG: No results for input(s): GLUCAP in the last 168 hours. Lipid Profile: No results for input(s): CHOL, HDL, LDLCALC, TRIG, CHOLHDL, LDLDIRECT in the last 72 hours. Thyroid Function Tests: No results for input(s): TSH, T4TOTAL, FREET4, T3FREE, THYROIDAB in the last 72 hours. Anemia Panel: Recent Labs    10/14/20 0306  FERRITIN 149  TIBC 412  IRON 30   Sepsis Labs: No results for input(s): PROCALCITON, LATICACIDVEN in the last 168 hours.  Recent Results (from the past 240 hour(s))  Resp Panel by RT-PCR (Flu A&B, Covid) Nasopharyngeal Swab     Status: None   Collection Time: 10/13/20 12:28 AM   Specimen: Nasopharyngeal Swab; Nasopharyngeal(NP) swabs in vial transport medium  Result Value Ref Range Status   SARS Coronavirus 2 by RT PCR NEGATIVE NEGATIVE Final    Comment: (NOTE) SARS-CoV-2 target nucleic acids are NOT DETECTED.  The SARS-CoV-2 RNA is generally detectable in upper respiratory specimens during the acute phase of infection. The lowest concentration of SARS-CoV-2 viral copies this assay can detect is 138 copies/mL. A negative result does not preclude SARS-Cov-2 infection and should not be used as the sole basis for treatment or other patient management decisions. A negative result may occur with  improper specimen collection/handling, submission of specimen other than nasopharyngeal swab, presence of viral mutation(s) within the areas targeted by this assay, and inadequate number of viral copies(<138 copies/mL). A negative  result must be combined with clinical observations, patient history, and epidemiological information. The expected result is Negative.  Fact Sheet for Patients:  EntrepreneurPulse.com.au  Fact Sheet for Healthcare Providers:  IncredibleEmployment.be  This test is no t yet approved or cleared by the [place] FDA and  has been authorized for detection and/or diagnosis of SARS-CoV-2 by FDA under an Emergency Use Authorization (EUA). This EUA will remain  in effect (meaning this test can be used) for the duration of the COVID-19 declaration under Section 564(b)(1) of the Act, 21 U.S.C.section 360bbb-3(b)(1), unless the authorization is terminated  or revoked sooner.       Influenza A by PCR NEGATIVE NEGATIVE Final   Influenza B by PCR NEGATIVE NEGATIVE Final    Comment: (NOTE) The Xpert Xpress SARS-CoV-2/FLU/RSV  plus assay is intended as an aid in the diagnosis of influenza from Nasopharyngeal swab specimens and should not be used as a sole basis for treatment. Nasal washings and aspirates are unacceptable for Xpert Xpress SARS-CoV-2/FLU/RSV testing.  Fact Sheet for Patients: EntrepreneurPulse.com.au  Fact Sheet for Healthcare Providers: IncredibleEmployment.be  This test is not yet approved or cleared by the [place] FDA and has been authorized for detection and/or diagnosis of SARS-CoV-2 by FDA under an Emergency Use Authorization (EUA). This EUA will remain in effect (meaning this test can be used) for the duration of the COVID-19 declaration under Section 564(b)(1) of the Act, 21 U.S.C. section 360bbb-3(b)(1), unless the authorization is terminated or revoked.  Performed at [facility], [address] [place], [place] [address]   Surgical PCR screen     Status: None   Collection Time: 10/13/20  5:46 PM   Specimen: Nasal Mucosa; Nasal Swab  Result Value Ref Range Status   MRSA, PCR  NEGATIVE NEGATIVE Final   Staphylococcus aureus NEGATIVE NEGATIVE Final    Comment: (NOTE) The Xpert SA Assay (FDA approved for NASAL specimens in patients 89 years of age and older), is one component of a comprehensive surveillance program. It is not intended to diagnose infection nor to guide or monitor treatment. Performed at [facility], [place] [address], [place], [place] [address]          Radiology Studies: No results found.      Scheduled Meds: . DULoxetine  60 mg Oral Daily  . fenofibrate  160 mg Oral Daily  . povidone-iodine  2 application Topical Once  . tranexamic acid (CYKLOKAPRON) topical - INTRAOP  2,000 mg Topical To OR   Continuous Infusions: .  ceFAZolin (ANCEF) IV    . tranexamic acid        Flora Lipps, MD [facility] 10/15/2020, 12:43 PM

## 2020-10-16 ENCOUNTER — Encounter (HOSPITAL_COMMUNITY): Payer: Self-pay | Admitting: Orthopaedic Surgery

## 2020-10-16 LAB — COMPREHENSIVE METABOLIC PANEL
ALT: 25 U/L (ref 0–44)
AST: 22 U/L (ref 15–41)
Albumin: 2.7 g/dL — ABNORMAL LOW (ref 3.5–5.0)
Alkaline Phosphatase: 64 U/L (ref 38–126)
Anion gap: 9 (ref 5–15)
BUN: 9 mg/dL (ref 6–20)
CO2: 24 mmol/L (ref 22–32)
Calcium: 9.1 mg/dL (ref 8.9–10.3)
Chloride: 100 mmol/L (ref 98–111)
Creatinine, Ser: 0.7 mg/dL (ref 0.44–1.00)
GFR, Estimated: >60 mL/min (ref >60)
Glucose, Bld: 108 mg/dL — ABNORMAL HIGH (ref 70–99)
Potassium: 3.8 mmol/L (ref 3.5–5.1)
Sodium: 133 mmol/L — ABNORMAL LOW (ref 135–145)
Total Bilirubin: 0.5 mg/dL (ref 0.3–1.2)
Total Protein: 6.3 g/dL — ABNORMAL LOW (ref 6.5–8.1)

## 2020-10-16 LAB — CBC
HCT: 29.3 % — ABNORMAL LOW (ref 36.0–46.0)
Hemoglobin: 9.7 g/dL — ABNORMAL LOW (ref 12.0–15.0)
MCH: 29.6 pg (ref 26.0–34.0)
MCHC: 33.1 g/dL (ref 30.0–36.0)
MCV: 89.3 fL (ref 80.0–100.0)
Platelets: 545 10*3/uL — ABNORMAL HIGH (ref 150–400)
RBC: 3.28 MIL/uL — ABNORMAL LOW (ref 3.87–5.11)
RDW: 12.7 % (ref 11.5–15.5)
WBC: 11.3 10*3/uL — ABNORMAL HIGH (ref 4.0–10.5)
nRBC: 0 % (ref 0.0–0.2)

## 2020-10-16 LAB — PHOSPHORUS: Phosphorus: 4.1 mg/dL (ref 2.5–4.6)

## 2020-10-16 LAB — MAGNESIUM: Magnesium: 1.9 mg/dL (ref 1.7–2.4)

## 2020-10-16 MED ORDER — LISINOPRIL 10 MG PO TABS
10.0000 mg | ORAL_TABLET | Freq: Every day | ORAL | Status: DC
  Filled 2020-10-16: qty 1

## 2020-10-16 MED ORDER — HYDROCHLOROTHIAZIDE 12.5 MG PO CAPS
12.5000 mg | ORAL_CAPSULE | Freq: Every day | ORAL | Status: DC
  Administered 2020-10-17: 12.5 mg via ORAL
  Filled 2020-10-16 (×2): qty 1

## 2020-10-16 MED ORDER — POTASSIUM CHLORIDE CRYS ER 20 MEQ PO TBCR
20.0000 meq | EXTENDED_RELEASE_TABLET | Freq: Every day | ORAL | Status: DC
  Administered 2020-10-16 – 2020-10-18 (×3): 20 meq via ORAL
  Filled 2020-10-16 (×3): qty 1

## 2020-10-16 MED ORDER — LISINOPRIL 10 MG PO TABS
10.0000 mg | ORAL_TABLET | Freq: Every day | ORAL | Status: DC
  Administered 2020-10-16 – 2020-10-18 (×3): 10 mg via ORAL
  Filled 2020-10-16 (×3): qty 1

## 2020-10-16 MED ORDER — OXYCODONE-ACETAMINOPHEN 10-325 MG PO TABS: 1.0000 | ORAL_TABLET | Freq: Four times a day (QID) | ORAL | Status: DC

## 2020-10-16 MED ORDER — GABAPENTIN 300 MG PO CAPS
300.0000 mg | ORAL_CAPSULE | Freq: Three times a day (TID) | ORAL | Status: DC
  Administered 2020-10-16 – 2020-10-19 (×11): 300 mg via ORAL
  Filled 2020-10-16 (×11): qty 1

## 2020-10-16 MED ORDER — HYDROCHLOROTHIAZIDE 12.5 MG PO CAPS
12.5000 mg | ORAL_CAPSULE | Freq: Every day | ORAL | Status: DC
Start: 2020-10-16 — End: 2020-10-16
  Administered 2020-10-16: 12.5 mg via ORAL
  Filled 2020-10-16: qty 1

## 2020-10-16 MED ORDER — LISINOPRIL-HYDROCHLOROTHIAZIDE 10-12.5 MG PO TABS: 1.0000 | ORAL_TABLET | Freq: Every day | ORAL | Status: DC

## 2020-10-16 NOTE — Progress Notes (Signed)
PROGRESS NOTE    Allison Berry  GGY:[id number] DOB: 1966/01/20 DOA: 10/12/2020 PCP: System, Provider Not In   Brief Narrative:   55 year old female with a history of hypertension, tobacco abuse, hyperlipidemia, chronic back pain, depression presented to hospital with left hip pain after sustaining a mechanical fall.  In the ED patient was noted to have a left subcapital fracture of the hip.  Orthopedics was consulted and patient was admitted hospital for further evaluation and treatment.  During hospitalization, patient underwent total left hip replacement on 10/15/2020 by orthopedics currently awaiting for PT evaluation.  Assessment & Plan:   Left subcapital femur fracture status post Mechanical fall Status post total left total hip replacement on 10/15/2020 by orthopedics.  Further plan as per orthopedics.  PT has been consulted.  Hypokalemia Improved.  Latest potassium of 3.8.  Leukocytosis--Reactive.    Essential hypertension On as needed IV hydralazine.  Lisinopril hydrochlorothiazide on hold..  Will resume  Hyperlipidemia -Continue TriCor  Depression/anxiety On Cymbalta at home  Tobacco abuse Continue nicotine patch.  Thrombocytosis Likely reactive.  Will monitor   DVT prophylaxis: Lovenox subcu  Code Status: Full  Family Communication:  None  Disposition Plan: Status is: Inpatient  Remains inpatient appropriate because:Inpatient level of care appropriate due to severity of illness Hip surgery needing PT evaluation.  Dispo: The patient is from: Home              Anticipated d/c is to: Home/rehabilitation depending upon PT evaluation.              Patient currently is not medically stable to d/c.   Difficult to place patient No   Consultants:  Orthopedics  Procedures:  Status post posterior left total hip replacement on 10/15/2020 by orthopedics  Antimicrobials: None   Subjective: Today, patient was seen and examined at bedside.  The mild hip  pain.  Denies any nausea vomiting shortness of breath chest pain or palpitations.  Has had a bowel movement day before.  Objective: Vitals:   10/15/20 1810 10/15/20 2053 10/16/20 0050 10/16/20 0557  BP: (!) 135/91 118/75 107/70 110/70  Pulse: (!) 101 95 99 96  Resp: 16 18 18 18   Temp: 98.3 F (36.8 C) 98.3 F (36.8 C) 98.5 F (36.9 C) 98.7 F (37.1 C)  TempSrc: Oral Oral Oral Oral  SpO2: 98% 97% 99% 93%  Weight:      Height:        Intake/Output Summary (Last 24 hours) at 10/16/2020 0747 Last data filed at 10/16/2020 0600 Gross per 24 hour  Intake 2100 ml  Output 2400 ml  Net -300 ml   Filed Weights   10/12/20 2324  Weight: 64.4 kg    Physical examination:  General:  Average built, not in obvious distress HENT:   No scleral pallor or icterus noted. Oral mucosa is moist.  Chest:  Clear breath sounds.  Diminished breath sounds bilaterally. No crackles or wheezes.  CVS: S1 &S2 heard. No murmur.  Regular rate and rhythm. Abdomen: Soft, nontender, nondistended.  Bowel sounds are heard.   Extremities: Left hip status post surgery with mild erythema with dressing. Psych: Alert, awake and oriented, normal mood CNS:  No cranial nerve deficits.  Power equal in all extremities.   Skin: Warm and dry.  Left hip surgery with dressing   Data Reviewed: I have personally reviewed following labs and imaging studies  CBC: Recent Labs  Lab 10/13/20 0026 10/14/20 0306 10/15/20 0701 10/16/20 0214  WBC 11.3* 9.3 8.3  11.3*  NEUTROABS 8.6*  --  4.2  --   HGB 11.6* 11.3* 11.1* 9.7*  HCT 35.6* 32.8* 32.5* 29.3*  MCV 90.8 87.2 86.7 89.3  PLT 549* 532* 565* 502*   Basic Metabolic Panel: Recent Labs  Lab 10/13/20 0026 10/13/20 0405 10/14/20 0306 10/15/20 0701 10/16/20 0214  NA 135  --  135 135 133*  K 2.8*  --  4.1 3.5 3.8  CL 100  --  101 100 100  CO2 23  --  26 25 24   GLUCOSE 116*  --  112* 95 108*  BUN 12  --  7 8 9   CREATININE 0.71  --  0.63 0.57 0.70  CALCIUM 9.3  --   9.7 9.3 9.1  MG  --  1.8 1.9 1.9 1.9  PHOS  --  3.1  --   --  4.1   GFR: Estimated Creatinine Clearance: 76.1 mL/min (by C-G formula based on SCr of 0.7 mg/dL). Liver Function Tests: Recent Labs  Lab 10/14/20 0306 10/16/20 0214  AST 34 22  ALT 34 25  ALKPHOS 73 64  BILITOT 0.6 0.5  PROT 6.6 6.3*  ALBUMIN 3.1* 2.7*   No results for input(s): LIPASE, AMYLASE in the last 168 hours. No results for input(s): AMMONIA in the last 168 hours. Coagulation Profile: Recent Labs  Lab 10/13/20 0026 10/14/20 0306  INR 1.0 1.0   Cardiac Enzymes: No results for input(s): CKTOTAL, CKMB, CKMBINDEX, TROPONINI in the last 168 hours. BNP (last 3 results) No results for input(s): PROBNP in the last 8760 hours. HbA1C: No results for input(s): HGBA1C in the last 72 hours. CBG: No results for input(s): GLUCAP in the last 168 hours. Lipid Profile: No results for input(s): CHOL, HDL, LDLCALC, TRIG, CHOLHDL, LDLDIRECT in the last 72 hours. Thyroid Function Tests: No results for input(s): TSH, T4TOTAL, FREET4, T3FREE, THYROIDAB in the last 72 hours. Anemia Panel: Recent Labs    10/14/20 0306  FERRITIN 149  TIBC 412  IRON 30   Sepsis Labs: No results for input(s): PROCALCITON, LATICACIDVEN in the last 168 hours.  Recent Results (from the past 240 hour(s))  Resp Panel by RT-PCR (Flu A&B, Covid) Nasopharyngeal Swab     Status: None   Collection Time: 10/13/20 12:28 AM   Specimen: Nasopharyngeal Swab; Nasopharyngeal(NP) swabs in vial transport medium  Result Value Ref Range Status   SARS Coronavirus 2 by RT PCR NEGATIVE NEGATIVE Final    Comment: (NOTE) SARS-CoV-2 target nucleic acids are NOT DETECTED.  The SARS-CoV-2 RNA is generally detectable in upper respiratory specimens during the acute phase of infection. The lowest concentration of SARS-CoV-2 viral copies this assay can detect is 138 copies/mL. A negative result does not preclude SARS-Cov-2 infection and should not be used as the  sole basis for treatment or other patient management decisions. A negative result may occur with  improper specimen collection/handling, submission of specimen other than nasopharyngeal swab, presence of viral mutation(s) within the areas targeted by this assay, and inadequate number of viral copies(<138 copies/mL). A negative result must be combined with clinical observations, patient history, and epidemiological information. The expected result is Negative.  Fact Sheet for Patients:  EntrepreneurPulse.com.au  Fact Sheet for Healthcare Providers:  IncredibleEmployment.be  This test is no t yet approved or cleared by the [place] FDA and  has been authorized for detection and/or diagnosis of SARS-CoV-2 by FDA under an Emergency Use Authorization (EUA). This EUA will remain  in effect (meaning this test can be  used) for the duration of the COVID-19 declaration under Section 564(b)(1) of the Act, 21 U.S.C.section 360bbb-3(b)(1), unless the authorization is terminated  or revoked sooner.       Influenza A by PCR NEGATIVE NEGATIVE Final   Influenza B by PCR NEGATIVE NEGATIVE Final    Comment: (NOTE) The Xpert Xpress SARS-CoV-2/FLU/RSV plus assay is intended as an aid in the diagnosis of influenza from Nasopharyngeal swab specimens and should not be used as a sole basis for treatment. Nasal washings and aspirates are unacceptable for Xpert Xpress SARS-CoV-2/FLU/RSV testing.  Fact Sheet for Patients: EntrepreneurPulse.com.au  Fact Sheet for Healthcare Providers: IncredibleEmployment.be  This test is not yet approved or cleared by the [place] FDA and has been authorized for detection and/or diagnosis of SARS-CoV-2 by FDA under an Emergency Use Authorization (EUA). This EUA will remain in effect (meaning this test can be used) for the duration of the COVID-19 declaration under Section 564(b)(1) of the  Act, 21 U.S.C. section 360bbb-3(b)(1), unless the authorization is terminated or revoked.  Performed at [facility], [address], [place], [place] [address]   Surgical PCR screen     Status: None   Collection Time: 10/13/20  5:46 PM   Specimen: Nasal Mucosa; Nasal Swab  Result Value Ref Range Status   MRSA, PCR NEGATIVE NEGATIVE Final   Staphylococcus aureus NEGATIVE NEGATIVE Final    Comment: (NOTE) The Xpert SA Assay (FDA approved for NASAL specimens in patients 74 years of age and older), is one component of a comprehensive surveillance program. It is not intended to diagnose infection nor to guide or monitor treatment. Performed at [facility], [place] [address], [place],  [address]          Radiology Studies: Pelvis Portable  Result Date: 10/15/2020 CLINICAL DATA:  Postop left hip replacement. EXAM: PORTABLE PELVIS 1-2 VIEWS COMPARISON:  Preoperative radiographs 10/12/2020 FINDINGS: Left hip arthroplasty in expected alignment. No periprosthetic lucency or fracture. Recent postsurgical change includes air and edema in the soft tissues. Remote right inferior ramus fracture. IMPRESSION: Left hip arthroplasty without immediate postoperative complication. Electronically Signed   By: Keith Rake M.D.   On: 10/15/2020 18:08   DG C-Arm 1-60 Min  Result Date: 10/15/2020 CLINICAL DATA:  Total hip arthroplasty. EXAM: OPERATIVE LEFT HIP (WITH PELVIS IF PERFORMED) TECHNIQUE: Fluoroscopic spot image(s) were submitted for interpretation post-operatively. COMPARISON:  Preoperative imaging. FINDINGS: Four fluoroscopic spot views obtained in the operating room. Interval left hip arthroplasty in expected alignment. Total fluoroscopy time 24 seconds. Total dose 1.34 mGy. IMPRESSION: Procedural fluoroscopy during left hip arthroplasty. Electronically Signed   By: Keith Rake M.D.   On: 10/15/2020 16:57   DG HIP OPERATIVE UNILAT W OR W/O PELVIS LEFT  Result Date:  10/15/2020 CLINICAL DATA:  Total hip arthroplasty. EXAM: OPERATIVE LEFT HIP (WITH PELVIS IF PERFORMED) TECHNIQUE: Fluoroscopic spot image(s) were submitted for interpretation post-operatively. COMPARISON:  Preoperative imaging. FINDINGS: Four fluoroscopic spot views obtained in the operating room. Interval left hip arthroplasty in expected alignment. Total fluoroscopy time 24 seconds. Total dose 1.34 mGy. IMPRESSION: Procedural fluoroscopy during left hip arthroplasty. Electronically Signed   By: Keith Rake M.D.   On: 10/15/2020 16:57      Scheduled Meds: . acetaminophen  1,000 mg Oral Q6H  . docusate sodium  100 mg Oral BID  . DULoxetine  60 mg Oral Daily  . enoxaparin (LOVENOX) injection  40 mg Subcutaneous Q24H  . fenofibrate  160 mg Oral Daily  .  nicotine  14 mg Transdermal Daily  . oxyCODONE  10 mg Oral Q12H   Continuous Infusions: . sodium chloride 75 mL/hr at 10/15/20 1849  .  ceFAZolin (ANCEF) IV 2 g (10/16/20 0232)  . methocarbamol (ROBAXIN) IV    . methocarbamol (ROBAXIN) IV       Flora Lipps, MD [facility] 10/16/2020, 7:47 AM

## 2020-10-16 NOTE — Evaluation (Signed)
Physical Therapy Evaluation Patient Details Name: Allison Berry MRN: [id number] DOB: 1965-08-04 Today's Date: 10/16/2020   History of Present Illness  55 yo female presents to [facility] on 3/27 with fall, L hip pain. Left hip x-ray showed acute, displaced, rotated left subcapital femoral neck fracture. s/p L DA-THA on 3/30. PMH includes hypertension, hyperlipidemia, chronic back pain, tobacco abuse.  Clinical Impression   Pt presents with reported severe L hip pain (presents to PT as moderate pain), post-operative LLE weakness superimposed on generalized weakness, impaired mobility requiring physical assist to get OOB, and decreased activity tolerance vs baseline. Pt to benefit from acute PT to address deficits. Pt able to take pivotal steps only this day due to feeling "woozy" and L hip pain, overall requiring min-mod assist to mobilize. PT recommending ST-SNF to maximize functional recovery, vs home with HHPT and increased family support.  PT to progress mobility as tolerated, and will continue to follow acutely.      Follow Up Recommendations SNF (vs home with HHPT and 24/7 from combination of son, husband, sister)    Equipment Recommendations  None recommended by PT    Recommendations for Other Services       Precautions / Restrictions Precautions Precautions: Fall Precaution Comments: history of several falls Restrictions Weight Bearing Restrictions: No LLE Weight Bearing: Weight bearing as tolerated      Mobility  Bed Mobility Overal bed mobility: Needs Assistance Bed Mobility: Supine to Sit     Supine to sit: Mod assist;HOB elevated     General bed mobility comments: mod assist for LLE translation to EOB, trunk elevation with RLE HHA, scooting to EOB with very increased time and effort. use of bedrails to perform.    Transfers Overall transfer level: Needs assistance Equipment used: Rolling walker (2 wheeled) Transfers: Sit to/from [facility] Sit to  Stand: Min assist Stand pivot transfers: Min assist       General transfer comment: min assist for power up, rise, steady, and pivot to recliner. Pt reporting "wooziness" (recently received IV dilaudid), further gait not assessed.  Ambulation/Gait             General Gait Details: pivotal steps only  Stairs            Wheelchair Mobility    Modified Rankin (Stroke Patients Only)       Balance Overall balance assessment: Needs assistance;History of Falls Sitting-balance support: No upper extremity supported;Feet supported Sitting balance-Leahy Scale: Fair     Standing balance support: Bilateral upper extremity supported;During functional activity Standing balance-Leahy Scale: Poor                               Pertinent Vitals/Pain Pain Assessment: Faces Faces Pain Scale: Hurts even more Pain Location: L hip (reports 45/10 pain, but not consistent with presentation) Pain Descriptors / Indicators: Sore;Discomfort;Burning Pain Intervention(s): Limited activity within patient's tolerance;Monitored during session;Premedicated before session;Repositioned;RN gave pain meds during session    [place] expects to be discharged to:: Private residence Living Arrangements: Spouse/significant other;Children Available Help at Discharge: Family;Available PRN/intermittently (per pt, husband helps "when he wants to, and he's home when he wants to be") Type of Home: House Home Access: Stairs to enter Entrance Stairs-Rails: Chemical engineer of Steps: 3 Home Layout: One level Home Equipment: Walker - 2 wheels;Cane - single point;Bedside commode;Shower seat      Prior Function Level of Independence: Needs assistance   Gait /  Transfers Assistance Needed: pt reports using RW for ambulation over the past two weeks consistently, otherwise used it intermittently  ADL's / Homemaking Assistance Needed: Pt reports her husband was doing  laundry 2 weeks PTA to help out, otherwise pt reports performing ADLs  Comments: Pt states she enjoys gardening and hiking     Hand Dominance   Dominant Hand: Right    Extremity/Trunk Assessment   Upper Extremity Assessment Upper Extremity Assessment: Defer to OT evaluation    Lower Extremity Assessment Lower Extremity Assessment: Generalized weakness;LLE deficits/detail LLE Deficits / Details: anticipated post-operative weakness; able to perform ankle pumps, quad set, heel slide very limited ROM LLE: Unable to fully assess due to pain    Cervical / Trunk Assessment Cervical / Trunk Assessment: Normal  Communication   Communication: No difficulties  Cognition Arousal/Alertness: Awake/alert Behavior During Therapy: Anxious Overall Cognitive Status: Within Functional Limits for tasks assessed                                 General Comments: Pt states "what if it hurts like before, I am very nervous about moving" prior to initiating mobility, requires step-by-step cuing to improve anxiety.      General Comments      Exercises     Assessment/Plan    PT Assessment Patient needs continued PT services  PT Problem List Decreased strength;Decreased mobility;Decreased range of motion;Decreased activity tolerance;Decreased balance;Decreased knowledge of use of DME;Pain;Decreased safety awareness       PT Treatment Interventions DME instruction;Therapeutic activities;Gait training;Therapeutic exercise;Patient/family education;Balance training;Stair training;Functional mobility training;Neuromuscular re-education    PT Goals (Current goals can be found in the Care Plan section)  Acute Rehab PT Goals Patient Stated Goal: get back to gardening PT Goal Formulation: With patient Time For Goal Achievement: 10/30/20 Potential to Achieve Goals: Good    Frequency Min 5X/week   Barriers to discharge        Co-evaluation               AM-PAC PT "6 Clicks"  Mobility  Outcome Measure Help needed turning from your back to your side while in a flat bed without using bedrails?: A Little Help needed moving from lying on your back to sitting on the side of a flat bed without using bedrails?: A Lot Help needed moving to and from a bed to a chair (including a wheelchair)?: A Lot Help needed standing up from a chair using your arms (e.g., wheelchair or bedside chair)?: A Little Help needed to walk in hospital room?: A Lot Help needed climbing 3-5 steps with a railing? : A Lot 6 Click Score: 14    End of Session Equipment Utilized During Treatment: Gait belt Activity Tolerance: Patient limited by fatigue;Patient limited by pain Patient left: in chair;with call bell/phone within reach;with chair alarm set Nurse Communication: Mobility status PT Visit Diagnosis: Other abnormalities of gait and mobility (R26.89);Difficulty in walking, not elsewhere classified (R26.2);Pain Pain - Right/Left: Right Pain - part of body: Hip    Time: 1430-1502 PT Time Calculation (min) (ACUTE ONLY): 32 min   Charges:   PT Evaluation $PT Eval Low Complexity: 1 Low PT Treatments $Therapeutic Activity: 8-22 mins       Stacie Glaze, PT Acute Rehabilitation Services Pager [phone]  Office [phone]  Briyonna Omara E Ruffin Pyo 10/16/2020, 4:04 PM

## 2020-10-16 NOTE — TOC CAGE-AID Note (Signed)
Transition of Care [facility]) - CAGE-AID Screening   Patient Details  Name: Allison Berry MRN: [id number] Date of Birth: 06-23-1966  Transition of Care [facility]): Iran Planas, RN, TRN Phone Number: 10/16/2020, 3:37 PM  CAGE-AID Screening:    Have You Ever Felt You Ought to Cut Down on Your Drinking or Drug Use?: No Have People Annoyed You By [employer] Your Drinking Or Drug Use?: No Have You Felt Bad Or Guilty About Your Drinking Or Drug Use?: No Have You Ever Had a Drink or Used Drugs First Thing In The Morning to Steady Your Nerves or to Get Rid of a Hangover?: No CAGE-AID Score: 0

## 2020-10-16 NOTE — Anesthesia Postprocedure Evaluation (Signed)
Anesthesia Post Note  Patient: Allison Berry  Procedure(s) Performed: TOTAL HIP ARTHROPLASTY ANTERIOR APPROACH (Left Hip)     Patient location during evaluation: PACU Anesthesia Type: General Level of consciousness: awake and alert Pain management: pain level controlled Vital Signs Assessment: post-procedure vital signs reviewed and stable Respiratory status: spontaneous breathing, nonlabored ventilation, respiratory function stable and patient connected to nasal cannula oxygen Cardiovascular status: blood pressure returned to baseline and stable Postop Assessment: no apparent nausea or vomiting Anesthetic complications: no   No complications documented.  Last Vitals:  Vitals:   10/16/20 0557 10/16/20 1025  BP: 110/70 112/69  Pulse: 96 97  Resp: 18 18  Temp: 37.1 C 37 C  SpO2: 93% 97%    Last Pain:  Vitals:   10/16/20 1025  TempSrc: Oral  PainSc:                  [name]

## 2020-10-17 LAB — CBC
HCT: 26.2 % — ABNORMAL LOW (ref 36.0–46.0)
Hemoglobin: 8.7 g/dL — ABNORMAL LOW (ref 12.0–15.0)
MCH: 29.5 pg (ref 26.0–34.0)
MCHC: 33.2 g/dL (ref 30.0–36.0)
MCV: 88.8 fL (ref 80.0–100.0)
Platelets: 527 10*3/uL — ABNORMAL HIGH (ref 150–400)
RBC: 2.95 MIL/uL — ABNORMAL LOW (ref 3.87–5.11)
RDW: 12.8 % (ref 11.5–15.5)
WBC: 11.2 10*3/uL — ABNORMAL HIGH (ref 4.0–10.5)
nRBC: 0 % (ref 0.0–0.2)

## 2020-10-17 LAB — BASIC METABOLIC PANEL
Anion gap: 9 (ref 5–15)
BUN: 11 mg/dL (ref 6–20)
CO2: 23 mmol/L (ref 22–32)
Calcium: 8.8 mg/dL — ABNORMAL LOW (ref 8.9–10.3)
Chloride: 101 mmol/L (ref 98–111)
Creatinine, Ser: 0.72 mg/dL (ref 0.44–1.00)
GFR, Estimated: >60 mL/min (ref >60)
Glucose, Bld: 106 mg/dL — ABNORMAL HIGH (ref 70–99)
Potassium: 3.7 mmol/L (ref 3.5–5.1)
Sodium: 133 mmol/L — ABNORMAL LOW (ref 135–145)

## 2020-10-17 NOTE — Evaluation (Signed)
Occupational Therapy Evaluation Patient Details Name: Allison Berry MRN: [id number] DOB: February 17, 1966 Today's Date: 10/17/2020    History of Present Illness 55 yo female presents to [facility] on 3/27 with fall, L hip pain. Left hip x-ray showed acute, displaced, rotated left subcapital femoral neck fracture. s/p L DA-THA on 3/30. PMH includes hypertension, hyperlipidemia, chronic back pain, tobacco abuse.   Clinical Impression   Pt presents with decline in function and safety with ADLs and ADL mobility with impaired strength, balance and endurance. PTA pt lived at home with her husband and her son nearby, pt was Ind with ADLs/selfcare and recently using RW for mobility. Pt currently requires mod A for LB ADLs, toileting and min A for mobility using RW. Pt would benefit from acute OT services to address impairments to maximize level of function and safety    Follow Up Recommendations  SNF    Equipment Recommendations  None recommended by OT    Recommendations for Other Services       Precautions / Restrictions Precautions Precautions: Fall Precaution Comments: history of several falls Restrictions Weight Bearing Restrictions: No LLE Weight Bearing: Weight bearing as tolerated      Mobility Bed Mobility Overal bed mobility: Needs Assistance       Supine to sit: Min assist     General bed mobility comments: pt in recliner upon arrival. min A with LEs onto bed    Transfers Overall transfer level: Needs assistance Equipment used: Rolling walker (2 wheeled) Transfers: Sit to/from [facility] Sit to Stand: Min assist Stand pivot transfers: Min assist            Balance Overall balance assessment: Needs assistance;History of Falls Sitting-balance support: No upper extremity supported;Feet supported Sitting balance-Leahy Scale: Fair     Standing balance support: Bilateral upper extremity supported;During functional activity Standing balance-Leahy Scale:  Poor                             ADL either performed or assessed with clinical judgement   ADL Overall ADL's : Needs assistance/impaired Eating/Feeding: Independent;Sitting   Grooming: Wash/dry hands;Wash/dry face;Standing   Upper Body Bathing: Supervision/ safety;Set up;Sitting   Lower Body Bathing: Moderate assistance;Sitting/lateral leans   Upper Body Dressing : Supervision/safety;Set up;Sitting   Lower Body Dressing: Moderate assistance   Toilet Transfer: Minimal assistance;Ambulation;RW;Comfort height toilet;Cueing for safety   Toileting- Clothing Manipulation and Hygiene: Moderate assistance;Sit to/from stand       Functional mobility during ADLs: Minimal assistance;Rolling walker;Cueing for safety       Vision Patient Visual Report: No change from baseline       Perception     Praxis      Pertinent Vitals/Pain Pain Assessment: 0-10 Pain Score: 5  Pain Descriptors / Indicators: Sore;Discomfort;Burning Pain Intervention(s): Monitored during session;Premedicated before session;Repositioned     Hand Dominance Right   Extremity/Trunk Assessment Upper Extremity Assessment Upper Extremity Assessment: Generalized weakness   Lower Extremity Assessment Lower Extremity Assessment: Defer to PT evaluation   Cervical / Trunk Assessment Cervical / Trunk Assessment: Normal   Communication Communication Communication: No difficulties   Cognition Arousal/Alertness: Awake/alert Behavior During Therapy: WFL for tasks assessed/performed Overall Cognitive Status: Within Functional Limits for tasks assessed                                     General Comments  Exercises     Shoulder Instructions      Home Living Family/patient expects to be discharged to:: Private residence Living Arrangements: Spouse/significant other;Children Available Help at Discharge: Family;Available PRN/intermittently Type of Home: House Home Access:  Stairs to enter [employer] of Steps: 3 Entrance Stairs-Rails: Left;Right Home Layout: One level     Bathroom Shower/Tub: Tub/shower unit;Walk-in shower         Home Equipment: Gilford Rile - 2 wheels;Cane - single point;Bedside commode;Shower seat          Prior Functioning/Environment Level of Independence: Needs assistance  Gait / Transfers Assistance Needed: pt reports using RW for ambulation over the past two weeks consistently, otherwise used it intermittently ADL's / Homemaking Assistance Needed: Pt reports her husband was doing laundry 2 weeks PTA to help out, otherwise pt reports performing ADLs   Comments: Pt states she enjoys gardening and hiking        OT Problem List: Decreased strength;Impaired balance (sitting and/or standing);Decreased activity tolerance;Decreased knowledge of use of DME or AE      OT Treatment/Interventions: Self-care/ADL training;DME and/or AE instruction;Therapeutic activities;Balance training;Patient/family education    OT Goals(Current goals can be found in the care plan section) Acute Rehab OT Goals Patient Stated Goal: get back to gardening OT Goal Formulation: With patient Time For Goal Achievement: 10/31/20 Potential to Achieve Goals: Good ADL Goals Pt Will Perform Grooming: with supervision;with set-up;standing Pt Will Perform Upper Body Bathing: with set-up;sitting Pt Will Perform Lower Body Bathing: with min assist;sitting/lateral leans;sit to/from stand Pt Will Perform Upper Body Dressing: with set-up;sitting Pt Will Transfer to Toilet: with min guard assist;ambulating Pt Will Perform Toileting - Clothing Manipulation and hygiene: with min assist;with min guard assist;sit to/from stand  OT Frequency: Min 2X/week   Barriers to D/C:            Co-evaluation              AM-PAC OT "6 Clicks" Daily Activity     Outcome Measure Help from another person eating meals?: None Help from another person taking care of  personal grooming?: A Little Help from another person toileting, which includes using toliet, bedpan, or urinal?: A Lot Help from another person bathing (including washing, rinsing, drying)?: A Lot Help from another person to put on and taking off regular upper body clothing?: A Little Help from another person to put on and taking off regular lower body clothing?: A Lot 6 Click Score: 16   End of Session Equipment Utilized During Treatment: Gait belt;Rolling walker;Other (comment) (3 in 1 over toilet)  Activity Tolerance: Patient tolerated treatment well Patient left: in bed;with call bell/phone within reach  OT Visit Diagnosis: Unsteadiness on feet (R26.81);Other abnormalities of gait and mobility (R26.89);History of falling (Z91.81);Pain Pain - Right/Left: Left Pain - part of body: Leg                Time: 1610-9604 OT Time Calculation (min): 29 min Charges:  OT General Charges $OT Visit: 1 Visit OT Evaluation $OT Eval Moderate Complexity: 1 Mod OT Treatments $Self Care/Home Management : 8-22 mins    Britt Bottom 10/17/2020, 2:55 PM

## 2020-10-17 NOTE — Progress Notes (Signed)
PROGRESS NOTE    Anyla Israelson  UJW:[id number] DOB: 1966-05-27 DOA: 10/12/2020 PCP: System, Provider Not In   Brief Narrative:   55 year old female with a history of hypertension, tobacco abuse, hyperlipidemia, chronic back pain, depression presented to hospital with left hip pain after sustaining a mechanical fall.  In the ED patient was noted to have a left subcapital fracture of the hip.  Orthopedics was consulted and patient was admitted hospital for further evaluation and treatment.  During hospitalization, patient underwent total left hip replacement on 10/15/2020 by orthopedics currently awaiting for PT evaluation.  Assessment & Plan:   Left subcapital femur fracture status post Mechanical fall Status post total left total hip replacement on 10/15/2020 by orthopedics.  Physical therapy has seen the patient and recommended skilled nursing facility placement on discharge.  Seen by orthopedics today.  Recommend Lovenox/ SCD for DVT prophylaxis.  Recommend follow-up with Dr Erlinda Hong 2 weeks after discharge for suture removal.  Hypokalemia Improved.  Latest potassium of 3.7.  Continue potassium supplements  Leukocytosis--Reactive.    Essential hypertension Resumed lisinopril hydrochlorothiazide.  Blood pressure of 101/61  Hyperlipidemia -Continue TriCor  Depression/anxiety On Cymbalta at home  Tobacco abuse Continue nicotine patch.  Thrombocytosis Likely reactive.  Will monitor   DVT prophylaxis: Lovenox subcu  Code Status: Full  Family Communication:  None  Disposition Plan: Status is: Inpatient  Remains inpatient appropriate because:Inpatient level of care appropriate due to severity of illness Hip surgery needing skilled nursing facility placement.  Dispo: The patient is from: Home              Anticipated d/c is to: Skilled nursing facility placement pending              Patient currently is  medically stable to d/c.   Difficult to place patient  No   Consultants:  Orthopedics  Procedures:  Status post posterior left total hip replacement on 10/15/2020 by orthopedics  Antimicrobials: None  Subjective: Today, patient was seen and examined at bedside.  The mild hip pain.  Denies any nausea vomiting shortness of breath chest pain or palpitations.  Has had a bowel movement day before.  Objective: Vitals:   10/16/20 2238 10/16/20 2300 10/17/20 0637 10/17/20 0729  BP: 96/72 96/72 97/65  101/61  Pulse: 100 100 96 (!) 101  Resp: 20   17  Temp: 98.6 F (37 C)  98.4 F (36.9 C) 98.3 F (36.8 C)  TempSrc: Oral  Oral Oral  SpO2: 95%  96% 92%  Weight:      Height:        Intake/Output Summary (Last 24 hours) at 10/17/2020 1319 Last data filed at 10/16/2020 1532 Gross per 24 hour  Intake 380.83 ml  Output --  Net 380.83 ml   Filed Weights   10/12/20 2324  Weight: 64.4 kg    Physical examination:  General:  Average built, not in obvious distress HENT:   No scleral pallor or icterus noted. Oral mucosa is moist.  Chest:  Clear breath sounds.  Diminished breath sounds bilaterally. No crackles or wheezes.  CVS: S1 &S2 heard. No murmur.  Regular rate and rhythm. Abdomen: Soft, nontender, nondistended.  Bowel sounds are heard.   Extremities: Left hip status post surgery with mild erythema. Psych: Alert, awake and oriented, normal mood CNS:  No cranial nerve deficits.  Power equal in all extremities.   Skin: Warm and dry.  Left hip surgery with dressing   Data Reviewed: I have personally reviewed following labs  and imaging studies  CBC: Recent Labs  Lab 10/13/20 0026 10/14/20 0306 10/15/20 0701 10/16/20 0214 10/17/20 0256  WBC 11.3* 9.3 8.3 11.3* 11.2*  NEUTROABS 8.6*  --  4.2  --   --   HGB 11.6* 11.3* 11.1* 9.7* 8.7*  HCT 35.6* 32.8* 32.5* 29.3* 26.2*  MCV 90.8 87.2 86.7 89.3 88.8  PLT 549* 532* 565* 545* 099*   Basic Metabolic Panel: Recent Labs  Lab 10/13/20 0026 10/13/20 0405 10/14/20 0306 10/15/20 0701  10/16/20 0214 10/17/20 0256  NA 135  --  135 135 133* 133*  K 2.8*  --  4.1 3.5 3.8 3.7  CL 100  --  101 100 100 101  CO2 23  --  26 25 24 23   GLUCOSE 116*  --  112* 95 108* 106*  BUN 12  --  7 8 9 11   CREATININE 0.71  --  0.63 0.57 0.70 0.72  CALCIUM 9.3  --  9.7 9.3 9.1 8.8*  MG  --  1.8 1.9 1.9 1.9  --   PHOS  --  3.1  --   --  4.1  --    GFR: Estimated Creatinine Clearance: 76.1 mL/min (by C-G formula based on SCr of 0.72 mg/dL). Liver Function Tests: Recent Labs  Lab 10/14/20 0306 10/16/20 0214  AST 34 22  ALT 34 25  ALKPHOS 73 64  BILITOT 0.6 0.5  PROT 6.6 6.3*  ALBUMIN 3.1* 2.7*   No results for input(s): LIPASE, AMYLASE in the last 168 hours. No results for input(s): AMMONIA in the last 168 hours. Coagulation Profile: Recent Labs  Lab 10/13/20 0026 10/14/20 0306  INR 1.0 1.0   Cardiac Enzymes: No results for input(s): CKTOTAL, CKMB, CKMBINDEX, TROPONINI in the last 168 hours. BNP (last 3 results) No results for input(s): PROBNP in the last 8760 hours. HbA1C: No results for input(s): HGBA1C in the last 72 hours. CBG: No results for input(s): GLUCAP in the last 168 hours. Lipid Profile: No results for input(s): CHOL, HDL, LDLCALC, TRIG, CHOLHDL, LDLDIRECT in the last 72 hours. Thyroid Function Tests: No results for input(s): TSH, T4TOTAL, FREET4, T3FREE, THYROIDAB in the last 72 hours. Anemia Panel: No results for input(s): VITAMINB12, FOLATE, FERRITIN, TIBC, IRON, RETICCTPCT in the last 72 hours. Sepsis Labs: No results for input(s): PROCALCITON, LATICACIDVEN in the last 168 hours.  Recent Results (from the past 240 hour(s))  Resp Panel by RT-PCR (Flu A&B, Covid) Nasopharyngeal Swab     Status: None   Collection Time: 10/13/20 12:28 AM   Specimen: Nasopharyngeal Swab; Nasopharyngeal(NP) swabs in vial transport medium  Result Value Ref Range Status   SARS Coronavirus 2 by RT PCR NEGATIVE NEGATIVE Final    Comment: (NOTE) SARS-CoV-2 target nucleic  acids are NOT DETECTED.  The SARS-CoV-2 RNA is generally detectable in upper respiratory specimens during the acute phase of infection. The lowest concentration of SARS-CoV-2 viral copies this assay can detect is 138 copies/mL. A negative result does not preclude SARS-Cov-2 infection and should not be used as the sole basis for treatment or other patient management decisions. A negative result may occur with  improper specimen collection/handling, submission of specimen other than nasopharyngeal swab, presence of viral mutation(s) within the areas targeted by this assay, and inadequate number of viral copies(<138 copies/mL). A negative result must be combined with clinical observations, patient history, and epidemiological information. The expected result is Negative.  Fact Sheet for Patients:  EntrepreneurPulse.com.au  Fact Sheet for Healthcare Providers:  IncredibleEmployment.be  This test is no t yet approved or cleared by the [place] and  has been authorized for detection and/or diagnosis of SARS-CoV-2 by FDA under an Emergency Use Authorization (EUA). This EUA will remain  in effect (meaning this test can be used) for the duration of the COVID-19 declaration under Section 564(b)(1) of the Act, 21 U.S.C.section 360bbb-3(b)(1), unless the authorization is terminated  or revoked sooner.       Influenza A by PCR NEGATIVE NEGATIVE Final   Influenza B by PCR NEGATIVE NEGATIVE Final    Comment: (NOTE) The Xpert Xpress SARS-CoV-2/FLU/RSV plus assay is intended as an aid in the diagnosis of influenza from Nasopharyngeal swab specimens and should not be used as a sole basis for treatment. Nasal washings and aspirates are unacceptable for Xpert Xpress SARS-CoV-2/FLU/RSV testing.  Fact Sheet for Patients: EntrepreneurPulse.com.au  Fact Sheet for Healthcare Providers: IncredibleEmployment.be  This  test is not yet approved or cleared by the [place] FDA and has been authorized for detection and/or diagnosis of SARS-CoV-2 by FDA under an Emergency Use Authorization (EUA). This EUA will remain in effect (meaning this test can be used) for the duration of the COVID-19 declaration under Section 564(b)(1) of the Act, 21 U.S.C. section 360bbb-3(b)(1), unless the authorization is terminated or revoked.  Performed at [facility], [address], [place], [place] [address]   Surgical PCR screen     Status: None   Collection Time: 10/13/20  5:46 PM   Specimen: Nasal Mucosa; Nasal Swab  Result Value Ref Range Status   MRSA, PCR NEGATIVE NEGATIVE Final   Staphylococcus aureus NEGATIVE NEGATIVE Final    Comment: (NOTE) The Xpert SA Assay (FDA approved for NASAL specimens in patients 1 years of age and older), is one component of a comprehensive surveillance program. It is not intended to diagnose infection nor to guide or monitor treatment. Performed at [facility], [place] [address] [place], [place] [address]          Radiology Studies: Pelvis Portable  Result Date: 10/15/2020 CLINICAL DATA:  Postop left hip replacement. EXAM: PORTABLE PELVIS 1-2 VIEWS COMPARISON:  Preoperative radiographs 10/12/2020 FINDINGS: Left hip arthroplasty in expected alignment. No periprosthetic lucency or fracture. Recent postsurgical change includes air and edema in the soft tissues. Remote right inferior ramus fracture. IMPRESSION: Left hip arthroplasty without immediate postoperative complication. Electronically Signed   By: Keith Rake M.D.   On: 10/15/2020 18:08   DG C-Arm 1-60 Min  Result Date: 10/15/2020 CLINICAL DATA:  Total hip arthroplasty. EXAM: OPERATIVE LEFT HIP (WITH PELVIS IF PERFORMED) TECHNIQUE: Fluoroscopic spot image(s) were submitted for interpretation post-operatively. COMPARISON:  Preoperative imaging. FINDINGS: Four fluoroscopic spot views obtained in the operating  room. Interval left hip arthroplasty in expected alignment. Total fluoroscopy time 24 seconds. Total dose 1.34 mGy. IMPRESSION: Procedural fluoroscopy during left hip arthroplasty. Electronically Signed   By: Keith Rake M.D.   On: 10/15/2020 16:57   DG HIP OPERATIVE UNILAT W OR W/O PELVIS LEFT  Result Date: 10/15/2020 CLINICAL DATA:  Total hip arthroplasty. EXAM: OPERATIVE LEFT HIP (WITH PELVIS IF PERFORMED) TECHNIQUE: Fluoroscopic spot image(s) were submitted for interpretation post-operatively. COMPARISON:  Preoperative imaging. FINDINGS: Four fluoroscopic spot views obtained in the operating room. Interval left hip arthroplasty in expected alignment. Total fluoroscopy time 24 seconds. Total dose 1.34 mGy. IMPRESSION: Procedural fluoroscopy during left hip arthroplasty. Electronically Signed   By: Keith Rake M.D.   On: 10/15/2020 16:57      Scheduled  Meds: . docusate sodium  100 mg Oral BID  . DULoxetine  60 mg Oral Daily  . enoxaparin (LOVENOX) injection  40 mg Subcutaneous Q24H  . fenofibrate  160 mg Oral Daily  . gabapentin  300 mg Oral TID  . lisinopril  10 mg Oral Daily   And  . hydrochlorothiazide  12.5 mg Oral Daily  . nicotine  14 mg Transdermal Daily  . oxyCODONE  10 mg Oral Q12H  . potassium chloride SA  20 mEq Oral Daily   Continuous Infusions: . sodium chloride 75 mL/hr at 10/15/20 1849  . methocarbamol (ROBAXIN) IV    . methocarbamol (ROBAXIN) IV       Flora Lipps, MD [facility] 10/17/2020, 1:19 PM

## 2020-10-17 NOTE — TOC Initial Note (Signed)
Transition of Care [facility]) - Initial/Assessment Note    Patient Details  Name: Allison Berry MRN: [id number] Date of Birth: 08-17-1965  [facility]) CM/SW Contact:    Bethann Berkshire, [place] Phone Number: 10/17/2020, 2:24 PM  Clinical Narrative:                  CSW met with pt for SNF consult. Pt is agreeable to SNF at this time. She lives at home in [place]; sometimes her estranged husband lives with her. Pt states she has a sister, daughter, and 2 son's near by. She said her oldest son could help at home after rehab or after hospital stay. Pt has DME including walker, rollator, 3 in 1, and shower chair. CSW explained SNF process included need for pt to commit to 30 days at SNF and would need to provide her disability check to SNF. Pt said she is okay with this at this time. She Prefers a SNF near home and mentions [facility]. Pt is okay with referrals faxed to [place] and [place]. CSW to complete FL2 and fax bed requests in the hub. She is vaccinated x3.   Expected Discharge Plan: Skilled Nursing Facility Barriers to Discharge: Continued Medical Work up   Patient Goals and CMS Choice Patient states their goals for this hospitalization and ongoing recovery are:: SNF rehab near her home CMS Medicare.gov Compare Post Acute Care list provided to:: Patient Choice offered to / list presented to : Patient  Expected Discharge Plan and Services Expected Discharge Plan: [place]       Living arrangements for the past 2 months: Single Family Home                                      Prior Living Arrangements/Services Living arrangements for the past 2 months: Single Family Home Lives with:: Self,Other (Comment) (Sometimes with her "estranged husband") Patient language and need for interpreter reviewed:: Yes        Need for Family Participation in Patient Care: No (Comment) Care giver support system in place?: Yes (comment) Current home  services: DME Criminal Activity/Legal Involvement Pertinent to Current Situation/Hospitalization: No - Comment as needed  Activities of Daily Living Home Assistive Devices/Equipment: Walker (specify type),Eyeglasses,Dentures (specify type) ADL Screening (condition at time of admission) Patient's cognitive ability adequate to safely complete daily activities?: Yes Is the patient deaf or have difficulty hearing?: No Does the patient have difficulty seeing, even when wearing glasses/contacts?: No Does the patient have difficulty concentrating, remembering, or making decisions?: No Patient able to express need for assistance with ADLs?: Yes Does the patient have difficulty dressing or bathing?: Yes Independently performs ADLs?: Yes (appropriate for developmental age) Does the patient have difficulty walking or climbing stairs?: Yes Weakness of Legs: Left Weakness of Arms/Hands: None  Permission Sought/Granted                  Emotional Assessment Appearance:: Appears stated age Attitude/Demeanor/Rapport: Engaged Affect (typically observed): Accepting Orientation: : Oriented to Self,Oriented to Place,Oriented to  Time,Oriented to Situation Alcohol / Substance Use: Not Applicable Psych Involvement: No (comment)  Admission diagnosis:  Fall [W19.XXXA] Closed left femoral fracture (HCC) [S72.92XA] Closed fracture of left hip, initial encounter [facility]) [S72.002A] Patient Active Problem List   Diagnosis Date Noted  . Left displaced femoral neck fracture ([place]) 10/13/2020  . Accidental fall 10/13/2020  . Leukocytosis 10/13/2020  .  Normocytic anemia 10/13/2020  . Thrombocytosis 07/21/2018  . Altered mental status 07/20/2018  . AKI (acute kidney injury) ([place]) 07/20/2018  . Hypertension 07/20/2018  . Chronic back pain 07/20/2018  . Dyslipidemia 07/20/2018  . Hypokalemia 07/20/2018  . Hypomagnesemia 07/20/2018  . Hypokalemia 07/20/2018   PCP:  System, Provider Not In Pharmacy:    [facility] [address], [place] [address] Phone: [phone] Fax: [phone]     Social Determinants of Health (SDOH) Interventions    Readmission Risk Interventions No flowsheet data found.

## 2020-10-17 NOTE — NC FL2 (Signed)
[place] LEVEL OF CARE SCREENING TOOL     IDENTIFICATION  Patient Name: Allison Berry Birthdate: 1965/10/17 Sex: female Admission Date (Current Location): 10/12/2020  [facility] and [place] Number:  Herbalist and Address:  [facility], [place] [address] [place], [place] [address]      Provider Number: [id number]  Attending Physician Name and Address:  Flora Lipps, MD  Relative Name and Phone Number:  Oletta Lamas (Son)   [phone] [facility])    Current Level of Care: Hospital Recommended Level of Care: [place] Prior Approval Number:    Date Approved/Denied:   PASRR Number: [id number]  Discharge Plan: SNF    Current Diagnoses: Patient Active Problem List   Diagnosis Date Noted  . Left displaced femoral neck fracture ([place]) 10/13/2020  . Accidental fall 10/13/2020  . Leukocytosis 10/13/2020  . Normocytic anemia 10/13/2020  . Thrombocytosis 07/21/2018  . Altered mental status 07/20/2018  . AKI (acute kidney injury) ([place]) 07/20/2018  . Hypertension 07/20/2018  . Chronic back pain 07/20/2018  . Dyslipidemia 07/20/2018  . Hypokalemia 07/20/2018  . Hypomagnesemia 07/20/2018  . Hypokalemia 07/20/2018    Orientation RESPIRATION BLADDER Height & Weight     Self,Situation,Time,Place  Normal Continent Weight: 142 lb (64.4 kg) Height:  5\' 6"  (167.6 cm)  BEHAVIORAL SYMPTOMS/MOOD NEUROLOGICAL BOWEL NUTRITION STATUS      Continent Diet (see d/c summary)  AMBULATORY STATUS COMMUNICATION OF NEEDS Skin   Extensive Assist Verbally Surgical wounds (Incision, left hip)                       Personal Care Assistance Level of Assistance  Bathing,Feeding,Dressing Bathing Assistance: Limited assistance Feeding assistance: Independent Dressing Assistance: Limited assistance     Functional Limitations Info  Sight,Hearing,Speech Sight Info: Adequate Hearing Info: Adequate Speech Info: Adequate     SPECIAL CARE FACTORS FREQUENCY  PT (By licensed PT),OT (By licensed OT)     PT Frequency: 5x/week OT Frequency: 5x/week            Contractures Contractures Info: Not present    Additional Factors Info  Code Status,Allergies Code Status Info: Full Code Allergies Info: No known allergies           Current Medications (10/17/2020):  This is the current hospital active medication list Current Facility-Administered Medications  Medication Dose Route Frequency Provider Last Rate Last Admin  . 0.9 %  sodium chloride infusion   Intravenous Continuous Leandrew Koyanagi, MD 75 mL/hr at 10/15/20 1849 New Bag at 10/15/20 1849  . alum & mag hydroxide-simeth (MAALOX/MYLANTA) 200-200-20 MG/5ML suspension 30 mL  30 mL Oral Q4H PRN Leandrew Koyanagi, MD      . docusate sodium (COLACE) capsule 100 mg  100 mg Oral BID Leandrew Koyanagi, MD   100 mg at 10/17/20 0946  . DULoxetine (CYMBALTA) DR capsule 60 mg  60 mg Oral Daily Leandrew Koyanagi, MD   60 mg at 10/17/20 0946  . enoxaparin (LOVENOX) injection 40 mg  40 mg Subcutaneous Q24H Leandrew Koyanagi, MD   40 mg at 10/17/20 0947  . fenofibrate tablet 160 mg  160 mg Oral Daily Leandrew Koyanagi, MD   160 mg at 10/17/20 0946  . gabapentin (NEURONTIN) capsule 300 mg  300 mg Oral TID Pokhrel, Laxman, MD   300 mg at 10/17/20 0946  . lisinopril (ZESTRIL) tablet 10 mg  10 mg Oral Daily Pokhrel, Laxman, MD  10 mg at 10/17/20 0946   And  . hydrochlorothiazide (MICROZIDE) capsule 12.5 mg  12.5 mg Oral Daily Pokhrel, Laxman, MD   12.5 mg at 10/17/20 0946  . HYDROmorphone (DILAUDID) injection 0.5-1 mg  0.5-1 mg Intravenous Q4H PRN Leandrew Koyanagi, MD   1 mg at 10/17/20 1227  . HYDROmorphone (DILAUDID) injection 1-2 mg  1-2 mg Intravenous Q4H PRN Leandrew Koyanagi, MD   2 mg at 10/16/20 1014  . magnesium citrate solution 1 Bottle  1 Bottle Oral Once PRN Leandrew Koyanagi, MD      . menthol-cetylpyridinium (CEPACOL) lozenge 3 mg  1 lozenge Oral PRN Leandrew Koyanagi, MD       Or  . phenol  (CHLORASEPTIC) mouth spray 1 spray  1 spray Mouth/Throat PRN Leandrew Koyanagi, MD      . methocarbamol (ROBAXIN) 500 mg in dextrose 5 % 50 mL IVPB  500 mg Intravenous Q6H PRN Leandrew Koyanagi, MD      . methocarbamol (ROBAXIN) tablet 500 mg  500 mg Oral Q6H PRN Leandrew Koyanagi, MD   500 mg at 10/15/20 1745   Or  . methocarbamol (ROBAXIN) 500 mg in dextrose 5 % 50 mL IVPB  500 mg Intravenous Q6H PRN Leandrew Koyanagi, MD      . nicotine (NICODERM CQ - dosed in mg/24 hours) patch 14 mg  14 mg Transdermal Daily Leandrew Koyanagi, MD   14 mg at 10/17/20 0946  . ondansetron (ZOFRAN) tablet 4 mg  4 mg Oral Q6H PRN Leandrew Koyanagi, MD       Or  . ondansetron [facility]) injection 4 mg  4 mg Intravenous Q6H PRN Leandrew Koyanagi, MD      . oxyCODONE (Oxy IR/ROXICODONE) immediate release tablet 10-15 mg  10-15 mg Oral Q4H PRN Leandrew Koyanagi, MD   10 mg at 10/17/20 1107  . oxyCODONE (Oxy IR/ROXICODONE) immediate release tablet 5-10 mg  5-10 mg Oral Q4H PRN Leandrew Koyanagi, MD      . oxyCODONE (OXYCONTIN) 12 hr tablet 10 mg  10 mg Oral Q12H Leandrew Koyanagi, MD   10 mg at 10/17/20 0946  . polyethylene glycol (MIRALAX / GLYCOLAX) packet 17 g  17 g Oral Daily PRN Leandrew Koyanagi, MD      . potassium chloride SA (KLOR-CON) CR tablet 20 mEq  20 mEq Oral Daily Pokhrel, Laxman, MD   20 mEq at 10/17/20 0946  . sorbitol 70 % solution 30 mL  30 mL Oral Daily PRN Leandrew Koyanagi, MD         Discharge Medications: Please see discharge summary for a list of discharge medications.  Relevant Imaging Results:  Relevant Lab Results:   Additional Information SSN [place], [place]

## 2020-10-17 NOTE — Progress Notes (Signed)
Subjective: 2 Days Post-Op Procedure(s) (LRB): TOTAL HIP ARTHROPLASTY ANTERIOR APPROACH (Left) Patient reports pain as mild.  Patient doing just as well as she was yesterday when I saw her (I neglected to put in note).  Some pain, but tolerable with pain meds.  No other complaints.   Objective: Vital signs in last 24 hours: Temp:  [98.3 F (36.8 C)-99.5 F (37.5 C)] 98.3 F (36.8 C) (04/01 0729) Pulse Rate:  [95-101] 101 (04/01 0729) Resp:  [17-20] 17 (04/01 0729) BP: (96-112)/(60-72) 101/61 (04/01 0729) SpO2:  [92 %-97 %] 92 % (04/01 0729)  Intake/Output from previous day: 03/31 0701 - 04/01 0700 In: 380.8 [I.V.:380.8] Out: -  Intake/Output this shift: No intake/output data recorded.  Recent Labs    10/15/20 0701 10/16/20 0214 10/17/20 0256  HGB 11.1* 9.7* 8.7*   Recent Labs    10/16/20 0214 10/17/20 0256  WBC 11.3* 11.2*  RBC 3.28* 2.95*  HCT 29.3* 26.2*  PLT 545* 527*   Recent Labs    10/16/20 0214 10/17/20 0256  NA 133* 133*  K 3.8 3.7  CL 100 101  CO2 24 23  BUN 9 11  CREATININE 0.70 0.72  GLUCOSE 108* 106*  CALCIUM 9.1 8.8*   No results for input(s): LABPT, INR in the last 72 hours.  Neurologically intact Neurovascular intact Sensation intact distally Intact pulses distally Dorsiflexion/Plantar flexion intact Incision: dressing C/D/I No cellulitis present Compartment soft   Assessment/Plan: 2 Days Post-Op Procedure(s) (LRB): TOTAL HIP ARTHROPLASTY ANTERIOR APPROACH (Left) Up with therapy  WBAT LLE ABLA- mild and stable Lovenox/scds for dvt ppx F/u with dr. Erlinda Hong two weeks po for suture removal D/c dispo per primary team      Aundra Dubin 10/17/2020, 7:48 AM

## 2020-10-17 NOTE — Progress Notes (Signed)
Physical Therapy Treatment Patient Details Name: Allison Berry MRN: [id number] DOB: [birth year] Today's Date: 10/17/2020    History of Present Illness 55 yo female presents to [facility] on 3/27 with fall, L hip pain. Left hip x-ray showed acute, displaced, rotated left subcapital femoral neck fracture. s/p L DA-THA on 3/30. PMH includes hypertension, hyperlipidemia, chronic back pain, tobacco abuse.    PT Comments    Pt was seen for mobility on RW with assistance to get to [facility] then chair.  After that she walked in room for a short trip, notably more steady and more reciprocal than her last note.  Pt is asking for pain meds, nursing assessing needs and provided her ice to assist with pain control with non medication benefit.  Follow acutely for goals of PT with focus on her ability to stand up alone, to walk longer distances and her strength on LLE.   Follow Up Recommendations  SNF     Equipment Recommendations  None recommended by PT    Recommendations for Other Services       Precautions / Restrictions Precautions Precautions: Fall Precaution Comments: history of several falls Restrictions Weight Bearing Restrictions: No LLE Weight Bearing: Weight bearing as tolerated    Mobility  Bed Mobility Overal bed mobility: Needs Assistance Bed Mobility: Supine to Sit     Supine to sit: Min assist     General bed mobility comments: pt sat on side of bed with PT with min assist    Transfers Overall transfer level: Needs assistance Equipment used: Rolling walker (2 wheeled) Transfers: Sit to/from [facility] Sit to Stand: Min assist Stand pivot transfers: Min assist          Ambulation/Gait Ambulation/Gait assistance: Min guard;Min assist Gait Distance (Feet): 15 Feet Assistive device: Rolling walker (2 wheeled);1 person hand held assist Gait Pattern/deviations: Step-through pattern;Decreased stride length;Wide base of support;Shuffle Gait velocity:  reduced Gait velocity interpretation: <1.31 ft/sec, indicative of household ambulator General Gait Details: able to walk with more reciprocal pattern after using [facility]   Stairs             Wheelchair Mobility    Modified Rankin (Stroke Patients Only)       Balance Overall balance assessment: Needs assistance;History of Falls Sitting-balance support: Feet supported Sitting balance-Leahy Scale: Fair     Standing balance support: Bilateral upper extremity supported;During functional activity Standing balance-Leahy Scale: Poor                              Cognition Arousal/Alertness: Awake/alert Behavior During Therapy: WFL for tasks assessed/performed Overall Cognitive Status: Within Functional Limits for tasks assessed                                        Exercises      General Comments General comments (skin integrity, edema, etc.): pt was assisted to get her standing balance control and got to [facility], then chair and then walked again with RW and min guard      Pertinent Vitals/Pain Pain Assessment: 0-10 Pain Score: 6  Pain Location: reporting her hip hurts but is moving better today than last PT note Pain Descriptors / Indicators: Grimacing;Guarding Pain Intervention(s): Limited activity within patient's tolerance;Monitored during session;Premedicated before session;Repositioned;Ice applied    Home Living Family/patient expects to be discharged to:: Private residence Living Arrangements: Spouse/significant  other;Children Available Help at Discharge: Family;Available PRN/intermittently Type of Home: House Home Access: Stairs to enter Entrance Stairs-Rails: Left;Right Home Layout: One level Home Equipment: Environmental consultant - 2 wheels;Cane - single point;Bedside commode;Shower seat      Prior Function Level of Independence: Needs assistance  Gait / Transfers Assistance Needed: pt reports using RW for ambulation over the past two weeks  consistently, otherwise used it intermittently ADL's / Homemaking Assistance Needed: Pt reports her husband was doing laundry 2 weeks PTA to help out, otherwise pt reports performing ADLs Comments: Pt states she enjoys gardening and hiking   PT Goals (current goals can now be found in the care plan section) Acute Rehab PT Goals Patient Stated Goal: get back to gardening Progress towards PT goals: Progressing toward goals    Frequency    Min 5X/week      PT Plan Current plan remains appropriate    Co-evaluation              AM-PAC PT "6 Clicks" Mobility   Outcome Measure  Help needed turning from your back to your side while in a flat bed without using bedrails?: A Little Help needed moving from lying on your back to sitting on the side of a flat bed without using bedrails?: A Lot Help needed moving to and from a bed to a chair (including a wheelchair)?: A Lot Help needed standing up from a chair using your arms (e.g., wheelchair or bedside chair)?: A Little Help needed to walk in hospital room?: A Little Help needed climbing 3-5 steps with a railing? : Total 6 Click Score: 14    End of Session Equipment Utilized During Treatment: Gait belt Activity Tolerance: Patient limited by fatigue;Patient limited by pain Patient left: in chair;with call bell/phone within reach;with chair alarm set Nurse Communication: Mobility status PT Visit Diagnosis: Other abnormalities of gait and mobility (R26.89);Difficulty in walking, not elsewhere classified (R26.2);Pain Pain - Right/Left: Right Pain - part of body: Hip     Time: 3762-8315 PT Time Calculation (min) (ACUTE ONLY): 25 min  Charges:  $Gait Training: 8-22 mins                   Ramond Dial 10/17/2020, 5:17 PM Mee Hives, PT MS Acute Rehab Dept. Number: [place] and [place]

## 2020-10-18 LAB — BASIC METABOLIC PANEL
Anion gap: 8 (ref 5–15)
BUN: 18 mg/dL (ref 6–20)
CO2: 25 mmol/L (ref 22–32)
Calcium: 9.1 mg/dL (ref 8.9–10.3)
Chloride: 99 mmol/L (ref 98–111)
Creatinine, Ser: 0.76 mg/dL (ref 0.44–1.00)
GFR, Estimated: >60 mL/min (ref >60)
Glucose, Bld: 99 mg/dL (ref 70–99)
Potassium: 4.3 mmol/L (ref 3.5–5.1)
Sodium: 132 mmol/L — ABNORMAL LOW (ref 135–145)

## 2020-10-18 LAB — CBC
HCT: 26.7 % — ABNORMAL LOW (ref 36.0–46.0)
Hemoglobin: 8.7 g/dL — ABNORMAL LOW (ref 12.0–15.0)
MCH: 29.6 pg (ref 26.0–34.0)
MCHC: 32.6 g/dL (ref 30.0–36.0)
MCV: 90.8 fL (ref 80.0–100.0)
Platelets: 570 10*3/uL — ABNORMAL HIGH (ref 150–400)
RBC: 2.94 MIL/uL — ABNORMAL LOW (ref 3.87–5.11)
RDW: 12.8 % (ref 11.5–15.5)
WBC: 12 10*3/uL — ABNORMAL HIGH (ref 4.0–10.5)
nRBC: 0 % (ref 0.0–0.2)

## 2020-10-18 MED ORDER — FERROUS GLUCONATE 324 (38 FE) MG PO TABS
324.0000 mg | ORAL_TABLET | Freq: Two times a day (BID) | ORAL | Status: DC
  Administered 2020-10-18 – 2020-10-19 (×3): 324 mg via ORAL
  Filled 2020-10-18 (×4): qty 1

## 2020-10-18 MED ORDER — ACETAMINOPHEN 325 MG PO TABS
650.0000 mg | ORAL_TABLET | Freq: Once | ORAL | Status: AC
  Administered 2020-10-18: 650 mg via ORAL
  Filled 2020-10-18: qty 2

## 2020-10-18 NOTE — Plan of Care (Signed)

## 2020-10-18 NOTE — Progress Notes (Signed)
Physical Therapy Treatment Patient Details Name: Allison Berry MRN: [id number] DOB: 1965-12-04 Today's Date: 10/18/2020    History of Present Illness 55 yo female presents to [facility] on 3/27 with fall, L hip pain. Left hip x-ray showed acute, displaced, rotated left subcapital femoral neck fracture. s/p L DA-THA on 3/30. PMH includes hypertension, hyperlipidemia, chronic back pain, tobacco abuse.    PT Comments    Pt reports pain at 8/10 on arrival to room, despite being premedicated. RN notified and gave IV pain meds during session. Pt able to perform supine ther ex with assist prior to ambulating in room. Will continue to follow acutely for mobility progression. Current plan remains appropriate.    Follow Up Recommendations  SNF     Equipment Recommendations  None recommended by PT    Recommendations for Other Services       Precautions / Restrictions Precautions Precautions: Fall Precaution Comments: history of several falls    Mobility  Bed Mobility Overal bed mobility: Needs Assistance Bed Mobility: Supine to Sit     Supine to sit: Min assist     General bed mobility comments: min A for LE management and for hand hold to elevate trunk.    Transfers Overall transfer level: Needs assistance Equipment used: Rolling walker (2 wheeled) Transfers: Sit to/from Stand Sit to Stand: Min guard         General transfer comment: close min guard for safety. Cues for hand placement.  Ambulation/Gait Ambulation/Gait assistance: Min guard Gait Distance (Feet): 30 Feet (x2) Assistive device: Rolling walker (2 wheeled);1 person hand held assist Gait Pattern/deviations: Step-through pattern;Decreased stride length;Wide base of support;Shuffle Gait velocity: reduced   General Gait Details: pt performed 2 trials of short distance gait in room. Good technique with RW as she has used one previously.   Stairs             Wheelchair Mobility    Modified Rankin (Stroke  Patients Only)       Balance Overall balance assessment: Needs assistance;History of Falls Sitting-balance support: Feet supported Sitting balance-Leahy Scale: Fair     Standing balance support: Bilateral upper extremity supported;During functional activity Standing balance-Leahy Scale: Poor                              Cognition Arousal/Alertness: Awake/alert Behavior During Therapy: WFL for tasks assessed/performed Overall Cognitive Status: Within Functional Limits for tasks assessed                                        Exercises Total Joint Exercises Ankle Circles/Pumps: AROM;Both;10 reps Short Arc QuadSinclair Berry;Left;10 reps;Supine (belt as leg lifter) Heel Slides: AAROM;Left;10 reps;Supine (belt as leg lifter) Hip ABduction/ADduction: AAROM;Left;10 reps;Supine (belt as leg lifter)    General Comments        Pertinent Vitals/Pain Pain Assessment: 0-10 Pain Score: 8  Pain Location: Hip Pain Descriptors / Indicators: Grimacing;Guarding Pain Intervention(s): Monitored during session;Limited activity within patient's tolerance;Repositioned;Premedicated before session;Patient requesting pain meds-RN notified;RN gave pain meds during session    Home Living                      Prior Function            PT Goals (current goals can now be found in the care plan section) Acute Rehab PT Goals Patient  Stated Goal: get back to gardening PT Goal Formulation: With patient Time For Goal Achievement: 10/30/20 Potential to Achieve Goals: Good Progress towards PT goals: Progressing toward goals    Frequency    Min 5X/week      PT Plan Current plan remains appropriate    Co-evaluation              AM-PAC PT "6 Clicks" Mobility   Outcome Measure  Help needed turning from your back to your side while in a flat bed without using bedrails?: A Little Help needed moving from lying on your back to sitting on the side of a flat  bed without using bedrails?: A Lot Help needed moving to and from a bed to a chair (including a wheelchair)?: A Lot Help needed standing up from a chair using your arms (e.g., wheelchair or bedside chair)?: A Little Help needed to walk in hospital room?: A Little Help needed climbing 3-5 steps with a railing? : Total 6 Click Score: 14    End of Session Equipment Utilized During Treatment: Gait belt Activity Tolerance: Patient tolerated treatment well Patient left: in chair;with call bell/phone within reach Nurse Communication: Mobility status PT Visit Diagnosis: Other abnormalities of gait and mobility (R26.89);Difficulty in walking, not elsewhere classified (R26.2);Pain Pain - Right/Left: Right Pain - part of body: Hip     Time: 3559-7416 PT Time Calculation (min) (ACUTE ONLY): 31 min  Charges:  $Gait Training: 8-22 mins $Therapeutic Exercise: 8-22 mins                    Benjiman Core, [place] Pager [phone] Acute Rehab   Allena Katz 10/18/2020, 11:56 AM

## 2020-10-18 NOTE — Progress Notes (Signed)
     Subjective: 3 Days Post-Op Procedure(s) (LRB): TOTAL HIP ARTHROPLASTY ANTERIOR APPROACH (Left) Awake, alert and oriented x 4. Husband is home intermittantly with his job and not able to be home reliably, may be able to have other family available to assist with her ADLs 24/7. I explained that even with an anterior hip replacement she may need help throughout the day and it is wise to have assistance for the first 1-2 weeks.    Patient reports pain as mild.    Objective:   VITALS:  Temp:  [98.2 F (36.8 C)-99.2 F (37.3 C)] 98.2 F (36.8 C) (04/02 0749) Pulse Rate:  [87-100] 96 (04/02 0749) Resp:  [16-17] 17 (04/02 0749) BP: (91-99)/(42-62) 99/62 (04/02 0749) SpO2:  [89 %-96 %] 90 % (04/02 0749)  Neurologically intact ABD soft Neurovascular intact Sensation intact distally Intact pulses distally Dorsiflexion/Plantar flexion intact Incision: no drainage No cellulitis present   LABS Recent Labs    10/16/20 0214 10/17/20 0256 10/18/20 0117  HGB 9.7* 8.7* 8.7*  WBC 11.3* 11.2* 12.0*  PLT 545* 527* 570*   Recent Labs    10/17/20 0256 10/18/20 0117  NA 133* 132*  K 3.7 4.3  CL 101 99  CO2 23 25  BUN 11 18  CREATININE 0.72 0.76  GLUCOSE 106* 99   No results for input(s): LABPT, INR in the last 72 hours.   Assessment/Plan: 3 Days Post-Op Procedure(s) (LRB): TOTAL HIP ARTHROPLASTY ANTERIOR APPROACH (Left) Anemia due to periop blood loss.  Advance diet Up with therapy Start ferrous gluconate.  She will discuss her situation with her husband tonight, if  he is not able to be home to assist and family is not available then Short term SNF may be indicated.  Basil Dess 10/18/2020, 11:19 AMPatient ID: Allison Berry, female   DOB: 14-Mar-1966, 55 y.o.   MRN: [id number]

## 2020-10-18 NOTE — Progress Notes (Signed)
PROGRESS NOTE  Taryne Kiger YTK:[id number] DOB: [birth year] DOA: 10/12/2020 PCP: System, Provider Not In  HPI/Recap of past 38 hours: 55 year old female with a history of hypertension, tobacco abuse, hyperlipidemia, chronic back pain, depression presented to hospital with left hip pain after sustaining a mechanical fall.  In the ED patient was noted to have a left subcapital fracture of the hip.  Orthopedics was consulted and patient was admitted hospital for further evaluation and treatment.  During hospitalization, patient underwent total left hip replacement on 10/15/2020 by orthopedics currently awaiting for PT evaluation.  10/18/20: Seen in her bedside.  PT in the room.  She has no new complaints.  PT recommended SNF.  TOC assisting with placement.  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  Physical therapy has seen the patient and recommended skilled nursing facility placement on discharge.  Seen by orthopedics.  Recommended Lovenox/ SCD for DVT prophylaxis.  Recommend follow-up with Dr Erlinda Hong 2 weeks after discharge for suture removal.  Resolved post repletion: Hypokalemia Improved.  Latest potassium of 3.7.  Continue potassium supplements  Leukocytosis--Reactive.  No evidence of active infective process.  Nonseptic appearing.  Essential hypertension BP stable. Resumed lisinopril hydrochlorothiazide.  Hyperlipidemia -Continue TriCor  Depression/anxiety On Cymbalta at home  Tobacco abuse Continue nicotine patch.  Thrombocytosis Likely reactive.  Will monitor   DVT prophylaxis: Lovenox subcu daily.  Code Status: Full  Family Communication:   None  Disposition Plan: Status is: Inpatient  Remains inpatient appropriate because:Inpatient level of care appropriate due to severity of illness Hip surgery needing skilled nursing facility placement.  Dispo: The patient is from: Home  Anticipated d/c is to: Skilled nursing facility placement when bed is available.  Patient currently is  medically stable to d/c.              Difficult to place patient No   Consultants:  Orthopedics  Procedures:  Status post posterior left total hip replacement on 10/15/2020 by orthopedics  Antimicrobials: None   Status is: Inpatient    Dispo:  Patient From: Home  Planned Disposition: [place]  Medically stable for discharge: Yes          Objective: Vitals:   10/17/20 0729 10/17/20 1941 10/18/20 0341 10/18/20 0749  BP: 101/61 (!) 91/42 (!) 91/56 99/62  Pulse: (!) 101 100 87 96  Resp: 17 16  17   Temp: 98.3 F (36.8 C) 99.2 F (37.3 C)  98.2 F (36.8 C)  TempSrc: Oral Oral  Oral  SpO2: 92% 96% (!) 89% 90%  Weight:      Height:        Intake/Output Summary (Last 24 hours) at 10/18/2020 1242 Last data filed at 10/18/2020 0810 Gross per 24 hour  Intake --  Output 1800 ml  Net -1800 ml   Filed Weights   10/12/20 2324  Weight: 64.4 kg    Exam:  . General: 55 y.o. year-old female well developed well nourished in no acute distress.  Alert and oriented x3. . Cardiovascular: Regular rate and rhythm with no rubs or gallops.  No thyromegaly or JVD noted.   Marland Kitchen Respiratory: Clear to auscultation with no wheezes or rales. Good inspiratory effort. . Abdomen: Soft nontender  nondistended with normal bowel sounds x4 quadrants. . Musculoskeletal: No lower extremity edema. 2/4 pulses in all 4 extremities. . Skin: No ulcerative lesions noted or rashes, . Psychiatry: Mood is appropriate for condition and setting   Data Reviewed: CBC: Recent Labs  Lab 10/13/20 0026 10/14/20 0306  10/15/20 0701 10/16/20 0214 10/17/20 0256 10/18/20 0117  WBC 11.3* 9.3 8.3 11.3* 11.2* 12.0*  NEUTROABS 8.6*  --  4.2  --   --   --   HGB 11.6* 11.3* 11.1* 9.7* 8.7* 8.7*  HCT 35.6* 32.8* 32.5* 29.3* 26.2* 26.7*  MCV 90.8 87.2 86.7 89.3 88.8 90.8  PLT 549* 532* 565* 545* 527* 790*   Basic Metabolic Panel: Recent Labs  Lab 10/13/20 0405 10/14/20 0306 10/15/20 0701 10/16/20 0214 10/17/20 0256 10/18/20 0117  NA  --  135 135 133* 133* 132*  K  --  4.1 3.5 3.8 3.7 4.3  CL  --  101 100 100 101 99  CO2  --  26 25 24 23 25   GLUCOSE  --  112* 95 108* 106* 99  BUN  --  7 8 9 11 18   CREATININE  --  0.63 0.57 0.70 0.72 0.76  CALCIUM  --  9.7 9.3 9.1 8.8* 9.1  MG 1.8 1.9 1.9 1.9  --   --   PHOS 3.1  --   --  4.1  --   --    GFR: Estimated Creatinine Clearance: 76.1 mL/min (by C-G formula based on SCr of 0.76 mg/dL). Liver Function Tests: Recent Labs  Lab 10/14/20 0306 10/16/20 0214  AST 34 22  ALT 34 25  ALKPHOS 73 64  BILITOT 0.6 0.5  PROT 6.6 6.3*  ALBUMIN 3.1* 2.7*   No results for input(s): LIPASE, AMYLASE in the last 168 hours. No results for input(s): AMMONIA in the last 168 hours. Coagulation Profile: Recent Labs  Lab 10/13/20 0026 10/14/20 0306  INR 1.0 1.0   Cardiac Enzymes: No results for input(s): CKTOTAL, CKMB, CKMBINDEX, TROPONINI in the last 168 hours. BNP (last 3 results) No results for input(s): PROBNP in the last 8760 hours. HbA1C: No results for input(s): HGBA1C in the last 72 hours. CBG: No results for input(s): GLUCAP in the last 168 hours. Lipid Profile: No results for input(s): CHOL, HDL, LDLCALC, TRIG, CHOLHDL, LDLDIRECT in the last 72 hours. Thyroid Function Tests: No results for input(s): TSH, T4TOTAL, FREET4, T3FREE, THYROIDAB in the last 72 hours. Anemia Panel: No results for input(s): VITAMINB12, FOLATE, FERRITIN, TIBC, IRON, RETICCTPCT in the last 72 hours. Urine analysis:    Component Value Date/Time   COLORURINE AMBER (A)  07/20/2018 1413   APPEARANCEUR CLOUDY (A) 07/20/2018 1413   LABSPEC 1.027 07/20/2018 1413   PHURINE 5.0 07/20/2018 1413   GLUCOSEU NEGATIVE 07/20/2018 1413   HGBUR NEGATIVE 07/20/2018 1413   BILIRUBINUR SMALL (A) 07/20/2018 1413   KETONESUR NEGATIVE 07/20/2018 1413   PROTEINUR 30 (A) 07/20/2018 1413   NITRITE NEGATIVE 07/20/2018 1413   LEUKOCYTESUR TRACE (A) 07/20/2018 1413   Sepsis Labs: @LABRCNTIP (procalcitonin:4,lacticidven:4)  ) Recent Results (from the past 240 hour(s))  Resp Panel by RT-PCR (Flu A&B, Covid) Nasopharyngeal Swab     Status: None   Collection Time: 10/13/20 12:28 AM   Specimen: Nasopharyngeal Swab; Nasopharyngeal(NP) swabs in vial transport medium  Result Value Ref Range Status   SARS Coronavirus 2 by RT PCR NEGATIVE NEGATIVE Final    Comment: (NOTE) SARS-CoV-2 target nucleic acids are NOT DETECTED.  The SARS-CoV-2 RNA is generally detectable  in upper respiratory specimens during the acute phase of infection. The lowest concentration of SARS-CoV-2 viral copies this assay can detect is 138 copies/mL. A negative result does not preclude SARS-Cov-2 infection and should not be used as the sole basis for treatment or other patient management decisions. A negative result may occur with  improper specimen collection/handling, submission of specimen other than nasopharyngeal swab, presence of viral mutation(s) within the areas targeted by this assay, and inadequate number of viral copies(<138 copies/mL). A negative result must be combined with clinical observations, patient history, and epidemiological information. The expected result is Negative.  Fact Sheet for Patients:  EntrepreneurPulse.com.au  Fact Sheet for Healthcare Providers:  IncredibleEmployment.be  This test is no t yet approved or cleared by the [place] FDA and  has been authorized for detection and/or diagnosis of SARS-CoV-2 by FDA under an Emergency Use  Authorization (EUA). This EUA will remain  in effect (meaning this test can be used) for the duration of the COVID-19 declaration under Section 564(b)(1) of the Act, 21 U.S.C.section 360bbb-3(b)(1), unless the authorization is terminated  or revoked sooner.       Influenza A by PCR NEGATIVE NEGATIVE Final   Influenza B by PCR NEGATIVE NEGATIVE Final    Comment: (NOTE) The Xpert Xpress SARS-CoV-2/FLU/RSV plus assay is intended as an aid in the diagnosis of influenza from Nasopharyngeal swab specimens and should not be used as a sole basis for treatment. Nasal washings and aspirates are unacceptable for Xpert Xpress SARS-CoV-2/FLU/RSV testing.  Fact Sheet for Patients: EntrepreneurPulse.com.au  Fact Sheet for Healthcare Providers: IncredibleEmployment.be  This test is not yet approved or cleared by the [place] FDA and has been authorized for detection and/or diagnosis of SARS-CoV-2 by FDA under an Emergency Use Authorization (EUA). This EUA will remain in effect (meaning this test can be used) for the duration of the COVID-19 declaration under Section 564(b)(1) of the Act, 21 U.S.C. section 360bbb-3(b)(1), unless the authorization is terminated or revoked.  Performed at [facility], [address], [place], [place] [address]   Surgical PCR screen     Status: None   Collection Time: 10/13/20  5:46 PM   Specimen: Nasal Mucosa; Nasal Swab  Result Value Ref Range Status   MRSA, PCR NEGATIVE NEGATIVE Final   Staphylococcus aureus NEGATIVE NEGATIVE Final    Comment: (NOTE) The Xpert SA Assay (FDA approved for NASAL specimens in patients 63 years of age and older), is one component of a comprehensive surveillance program. It is not intended to diagnose infection nor to guide or monitor treatment. Performed at [facility], [place] [address], [place], [place] [address]       Studies: No results found.  Scheduled Meds: . docusate  sodium  100 mg Oral BID  . DULoxetine  60 mg Oral Daily  . enoxaparin (LOVENOX) injection  40 mg Subcutaneous Q24H  . fenofibrate  160 mg Oral Daily  . ferrous gluconate  324 mg Oral BID WC  . gabapentin  300 mg Oral TID  . lisinopril  10 mg Oral Daily   And  . hydrochlorothiazide  12.5 mg Oral Daily  . nicotine  14 mg Transdermal Daily  . oxyCODONE  10 mg Oral Q12H  . potassium chloride SA  20 mEq Oral Daily    Continuous Infusions: . sodium chloride 75 mL/hr at 10/15/20 1849  . methocarbamol (ROBAXIN) IV    . methocarbamol (ROBAXIN) IV       LOS: 5 days  Kayleen Memos, MD [facility] Pager [phone]  If 7PM-7AM, please contact night-coverage [email or web address] Password [facility] 10/18/2020, 12:42 PM

## 2020-10-19 LAB — CBC
HCT: 25.3 % — ABNORMAL LOW (ref 36.0–46.0)
Hemoglobin: 8.3 g/dL — ABNORMAL LOW (ref 12.0–15.0)
MCH: 28.8 pg (ref 26.0–34.0)
MCHC: 32.8 g/dL (ref 30.0–36.0)
MCV: 87.8 fL (ref 80.0–100.0)
Platelets: 611 10*3/uL — ABNORMAL HIGH (ref 150–400)
RBC: 2.88 MIL/uL — ABNORMAL LOW (ref 3.87–5.11)
RDW: 12.7 % (ref 11.5–15.5)
WBC: 8.8 10*3/uL (ref 4.0–10.5)
nRBC: 0 % (ref 0.0–0.2)

## 2020-10-19 MED ORDER — OXYCODONE-ACETAMINOPHEN 5-325 MG PO TABS
1.0000 | ORAL_TABLET | ORAL | 0 refills | Status: AC | PRN
Start: 2020-10-19 — End: 2020-10-26

## 2020-10-19 MED ORDER — METHOCARBAMOL 500 MG PO TABS: 500.0000 mg | ORAL_TABLET | Freq: Four times a day (QID) | ORAL | 1 refills | Status: DC | PRN

## 2020-10-19 MED ORDER — ASPIRIN EC 81 MG PO TBEC: 81.0000 mg | DELAYED_RELEASE_TABLET | Freq: Two times a day (BID) | ORAL | 2 refills | Status: AC

## 2020-10-19 MED ORDER — KETOROLAC TROMETHAMINE 30 MG/ML IJ SOLN
30.0000 mg | Freq: Once | INTRAMUSCULAR | Status: AC
  Administered 2020-10-19: 30 mg via INTRAVENOUS
  Filled 2020-10-19: qty 1

## 2020-10-19 MED ORDER — ACETAMINOPHEN 325 MG PO TABS
650.0000 mg | ORAL_TABLET | Freq: Four times a day (QID) | ORAL | Status: DC | PRN
  Administered 2020-10-19: 650 mg via ORAL
  Filled 2020-10-19: qty 2

## 2020-10-19 MED ORDER — DOCUSATE SODIUM 100 MG PO CAPS: 100.0000 mg | ORAL_CAPSULE | Freq: Two times a day (BID) | ORAL | 0 refills | Status: AC

## 2020-10-19 NOTE — Progress Notes (Signed)
Patient has delayed her own discharge stating that her home pharmacy is closed and that she does not have any pain medications to take at home.  Pharmacy checked her recent trend in narcotic prescriptions.  She gets percocet 10/325 on schedule every 15-30 days depending on the fill qty, last fill 3/23 qty 120, 2/18 qty 120 and so on   Due to concern for inappropriate use of narcotics, we will not prescribe any additional narcotics at this time.     To note, orthopedic surgery sent scripts for percocet 5-325 mg 1-2 tablets Q4H PRN to her home pharmacy for up to 7 days for severe pain.

## 2020-10-19 NOTE — Progress Notes (Signed)
Physical Therapy Treatment Patient Details Name: Allison Berry MRN: [id number] DOB: 1966/01/28 Today's Date: 10/19/2020    History of Present Illness Pt is a 55 y.o. female admitted 10/12/20 after fall sustaining L subcapital femoral neck fx. S/p L THA (direct anterior approach) on 3/30. PMH includes HTN, chronic back pain, tobacco abuse.   PT Comments    Pt progressing well with mobility; able to perform transfer and gait training with RW, as well as stair training this session. Pt moving well with supervision for safety. Reviewed educ re: precautions, positioning, DVT prevention, activity recommendations, therex, fall risk reduction. Pt preparing for d/c home this afternoon; will have necessary support from family. Pt reports no further questions or concerns; pt to follow-up with OP PT services.    Follow Up Recommendations  Outpatient PT;Supervision - Intermittent (unable to get [facility] in area)     Equipment Recommendations  None recommended by PT    Recommendations for Other Services       Precautions / Restrictions Precautions Precautions: Fall Restrictions Weight Bearing Restrictions: Yes LLE Weight Bearing: Weight bearing as tolerated    Mobility  Bed Mobility Overal bed mobility: Modified Independent Bed Mobility: Supine to Sit     Supine to sit: Modified independent (Device/Increase time);HOB elevated          Transfers Overall transfer level: Modified independent Equipment used: Rolling walker (2 wheeled) Transfers: Sit to/from Stand              Ambulation/Gait Ambulation/Gait assistance: Supervision Gait Distance (Feet): 140 Feet Assistive device: Rolling walker (2 wheeled) Gait Pattern/deviations: Step-through pattern;Decreased stride length;Antalgic;Trunk flexed Gait velocity: Decreased   General Gait Details: Slow, antalgic but steady gait with RW and supervision for safety   Stairs Stairs: Yes Stairs assistance: Min guard;Supervision Stair  Management: One rail Left;Step to pattern;Sideways Number of Stairs: 4 General stair comments: Ascend/descended 4 steps with BUE support on L-side rail; educ on technique with painful LLE; initial min guard for balance progressing to supervision   Wheelchair Mobility    Modified Rankin (Stroke Patients Only)       Balance Overall balance assessment: Needs assistance;History of Falls   Sitting balance-Leahy Scale: Good Sitting balance - Comments: Able to doff/don R sock, required assist to reach L foot; able to change from gown to nightshirt while sitting EOB   Standing balance support: Bilateral upper extremity supported;During functional activity;No upper extremity supported Standing balance-Leahy Scale: Fair Standing balance comment: Can static stand without UE support briefly, min guard for balance; static and dynamic stability improved with RW                            Cognition Arousal/Alertness: Awake/alert Behavior During Therapy: WFL for tasks assessed/performed Overall Cognitive Status: Within Functional Limits for tasks assessed                                        Exercises General Exercises - Lower Extremity Long Arc Quad: AROM;Both;Seated Hip Flexion/Marching: AROM;AAROM;Both;Seated Toe Raises: AROM;Both;Seated Heel Raises: AROM;Both;Seated    General Comments General comments (skin integrity, edema, etc.): Reviewed educ re: precautions, positioning, activity recommendations, therex, fall risk reduction      Pertinent Vitals/Pain Pain Assessment: Faces Faces Pain Scale: Hurts little more Pain Location: L hip Pain Descriptors / Indicators: Discomfort;Guarding Pain Intervention(s): Monitored during session;Premedicated before session  Home Living                      Prior Function            PT Goals (current goals can now be found in the care plan section) Progress towards PT goals: Progressing toward  goals    Frequency    Min 5X/week      PT Plan Discharge plan needs to be updated    Co-evaluation              AM-PAC PT "6 Clicks" Mobility   Outcome Measure  Help needed turning from your back to your side while in a flat bed without using bedrails?: None Help needed moving from lying on your back to sitting on the side of a flat bed without using bedrails?: A Little Help needed moving to and from a bed to a chair (including a wheelchair)?: A Little Help needed standing up from a chair using your arms (e.g., wheelchair or bedside chair)?: A Little Help needed to walk in hospital room?: A Little Help needed climbing 3-5 steps with a railing? : A Little 6 Click Score: 19    End of Session Equipment Utilized During Treatment: Gait belt Activity Tolerance: Patient tolerated treatment well Patient left: in chair;with call bell/phone within reach;with chair alarm set Nurse Communication: Mobility status PT Visit Diagnosis: Other abnormalities of gait and mobility (R26.89);Difficulty in walking, not elsewhere classified (R26.2);Pain Pain - Right/Left: Left Pain - part of body: Hip     Time: 1350-1414 PT Time Calculation (min) (ACUTE ONLY): 24 min  Charges:  $Gait Training: 8-22 mins $Therapeutic Activity: 8-22 mins                     [name], PT, DPT Acute Rehabilitation Services  Pager [phone] Office [place] 10/19/2020, 2:26 PM

## 2020-10-19 NOTE — Plan of Care (Signed)

## 2020-10-19 NOTE — Progress Notes (Signed)
     Subjective: 4 Days Post-Op Procedure(s) (LRB): TOTAL HIP ARTHROPLASTY ANTERIOR APPROACH (Left) Awake, alert and oriented x4. She spoke with her husband and he will  Be home every afternoon by 2-3 PM and her sisters will help follow and care for her in the AM. She is able to stand and walk with PT and OT with use of a walker. Tolerating po nourishment and pain relievers. Will use baby aspirin for AntiDVT prophylaxis.   Patient reports pain as moderate.    Objective:   VITALS:  Temp:  [98.4 F (36.9 C)-99.6 F (37.6 C)] 99.5 F (37.5 C) (04/03 0724) Pulse Rate:  [83-99] 89 (04/03 0724) Resp:  [14-18] 14 (04/03 0724) BP: (86-94)/(43-61) 93/61 (04/03 0724) SpO2:  [90 %-98 %] 90 % (04/03 0724)  Neurologically intact ABD soft Neurovascular intact Sensation intact distally Intact pulses distally Dorsiflexion/Plantar flexion intact Incision: dressing C/D/I and no drainage   LABS Recent Labs    10/17/20 0256 10/18/20 0117 10/19/20 1130  HGB 8.7* 8.7* 8.3*  WBC 11.2* 12.0* 8.8  PLT 527* 570* 611*   Recent Labs    10/17/20 0256 10/18/20 0117  NA 133* 132*  K 3.7 4.3  CL 101 99  CO2 23 25  BUN 11 18  CREATININE 0.72 0.76  GLUCOSE 106* 99   No results for input(s): LABPT, INR in the last 72 hours.   Assessment/Plan: 4 Days Post-Op Procedure(s) (LRB): TOTAL HIP ARTHROPLASTY ANTERIOR APPROACH (Left)  Advance diet Up with therapy Discharge home with home health  Basil Dess 10/19/2020, 12:16 PMPatient ID: Allison Berry, female   DOB: 1965-09-25, 55 y.o.   MRN: [id number]

## 2020-10-19 NOTE — Discharge Summary (Signed)
Discharge Summary  Allison Berry LSL:[id number] DOB: [birth year]  PCP: System, Provider Not In  Admit date: 10/12/2020 Discharge date: 10/19/2020  Time spent: 35 minutes.  Recommendations for Outpatient Follow-up:  1. Follow-up with orthopedic surgery as recommended by orthopedic surgery. 2. Please follow instructions as recommended by orthopedic surgery. 3. Assessed by PT Allison Berry on 10/19/2020 with recommendation for outpatient PT. 4. Take your medications as prescribed 5. Continue fall precautions. 6. Repeat CBC at your primary care provider's office on Friday for 10/24/20.  Discharge Diagnoses:  Active Hospital Problems   Diagnosis Date Noted  . Left displaced femoral neck fracture ([place]) 10/13/2020  . Accidental fall 10/13/2020  . Leukocytosis 10/13/2020  . Normocytic anemia 10/13/2020  . Thrombocytosis 07/21/2018  . Hypokalemia 07/20/2018  . Hypertension 07/20/2018  . Dyslipidemia 07/20/2018    Resolved Hospital Problems  No resolved problems to display.    Discharge Condition: Stable.  Diet recommendation: Resume previous diet.  Vitals:   10/19/20 0724 10/19/20 1228  BP: 93/61 (!) 89/54  Pulse: 89 88  Resp: 14 17  Temp: 99.5 F (37.5 C) 99 F (37.2 C)  SpO2: 90% 96%    History of present illness:  55 year old female with a history of hypertension, tobacco use disorder, hyperlipidemia, chronic back pain, chronic depressionpresented to hospital with left hip pain after sustaining a mechanical fall. In the ED patient was noted to have a left subcapital fracture of the hip. Orthopedics was consulted and patient was admitted hospital for further evaluation and treatment.  During hospitalization, patient underwent total left hip replacement on 10/15/2020 by orthopedic surgery Dr. Erlinda Berry.  Initially PT OT had recommended SNF and on 10/19/2020 she was seen by PT with recommendation for outpatient PT.    10/19/20:  Seen at her bedside.  There were no acute events  overnight.  Per orthopedic surgery okay to discharge to home.  Pain medication sent to pharmacy by orthopedic surgery.  For DVT prophylaxis orthopedic surgery has instructed aspirin 81 mg twice daily.  She will follow-up with orthopedic surgery outpatient.  Hospital Course:  Principal Problem:   Left displaced femoral neck fracture ([facility]) Active Problems:   Hypertension   Dyslipidemia   Hypokalemia   Thrombocytosis   Accidental fall   Leukocytosis   Normocytic anemia  Left subcapital femur fracture from mechanical fall status post left total hip arthroplasty, anterior approach on 10/15/2020 by Dr. Erlinda Berry. Status post total left total hip replacement on 10/15/2020 by orthopedics. Recommend follow-up with Dr Allison Berry after discharge for suture removal. Pain control by orthopedic surgery And DVT prophylaxis per orthopedic surgery  Acute blood loss anemia post orthopedic surgery Hemoglobin prior to surgery 11.3, post surgery 9.7 Hemoglobin down trended to 8.3 Repeat CBC after primary care provider on Friday, 10/24/2020.  Resolved post repletion: Hypokalemia Serum potassium 4.3.  Resolved leukocytosis- Reactive.  No evidence of active infective process.   Nonseptic appearing.  Essential hypertension BPs are soft. Hold off home oral antihypertensives Follow-up with your primary care provider  Hyperlipidemia -Continue fenofibrate  Depression/anxiety Continue home regimen Cymbalta at home  Tobacco abuse Continue nicotine patch.  Thrombocytosis Likely reactive.  Repeat CBC on Friday, 10/24/2020.     Code Status:Full    Consultants: Orthopedics  Procedures: Status post posterior left total hip replacement on 10/15/2020 by orthopedic surgery Dr. Erlinda Berry  Antimicrobials: None   Discharge Exam: BP (!) 89/54 (BP Location: Right Arm)   Pulse 88   Temp 99 F (37.2 C) (Oral)   Resp 17  Ht _0  (1.676 m)   Wt 64.4 kg   SpO2 96%   BMI 22.92 kg/m  . General:  55 y.o. year-old female well developed well nourished in no acute distress.  Alert and oriented x3. . Cardiovascular: Regular rate and rhythm with no rubs or gallops.  No thyromegaly or JVD noted.   Marland Kitchen Respiratory: Clear to auscultation with no wheezes or rales. Good inspiratory effort. . Abdomen: Soft nontender nondistended with normal bowel sounds x4 quadrants. . Musculoskeletal: 2 out of 4 pulses in all 4 extremities. Marland Kitchen Psychiatry: Mood is appropriate for condition and setting  Discharge Instructions You were cared for by a hospitalist during your hospital stay. If you have any questions about your discharge medications or the care you received while you were in the hospital after you are discharged, you can call the unit and asked to speak with the hospitalist on call if the hospitalist that took care of you is not available. Once you are discharged, your primary care physician will handle any further medical issues. Please note that NO REFILLS for any discharge medications will be authorized once you are discharged, as it is imperative that you return to your primary care physician (or establish a relationship with a primary care physician if you do not have one) for your aftercare needs so that they can reassess your need for medications and monitor your lab values.  Discharge Instructions    Ambulatory referral to Physical Therapy   Complete by: As directed    Call MD / Call 911   Complete by: As directed    If you experience chest pain or shortness of breath, CALL 911 and be transported to the hospital emergency room.  If you develope a fever above 101 F, pus (white drainage) or increased drainage or redness at the wound, or calf pain, call your surgeon's office.   Call MD / Call 911   Complete by: As directed    If you experience chest pain or shortness of breath, CALL 911 and be transported to the hospital emergency room.  If you develope a fever above 101 F, pus (white drainage) or  increased drainage or redness at the wound, or calf pain, call your surgeon's office.   Constipation Prevention   Complete by: As directed    Drink plenty of fluids.  Prune juice may be helpful.  You may use a stool softener, such as Colace (over the counter) 100 mg twice a day.  Use MiraLax (over the counter) for constipation as needed.   Constipation Prevention   Complete by: As directed    Drink plenty of fluids.  Prune juice may be helpful.  You may use a stool softener, such as Colace (over the counter) 100 mg twice a day.  Use MiraLax (over the counter) for constipation as needed.   Diet - low sodium heart healthy   Complete by: As directed    Diet - low sodium heart healthy   Complete by: As directed    Discharge instructions   Complete by: As directed    INSTRUCTIONS AFTER JOINT REPLACEMENT   Remove items at home which could result in a fall. This includes throw rugs or furniture in walking pathways ICE to the affected joint every three hours while awake for 30 minutes at a time, for at least the first 3-5 days, and then as needed for pain and swelling.  Continue to use ice for pain and swelling. You may notice swelling that will  progress down to the foot and ankle.  This is normal after surgery.  Elevate your leg when you are not up walking on it.   Continue to use the breathing machine you got in the hospital (incentive spirometer) which will help keep your temperature down.  It is common for your temperature to cycle up and down following surgery, especially at night when you are not up moving around and exerting yourself.  The breathing machine keeps your lungs expanded and your temperature down.   DIET:  As you were doing prior to hospitalization, we recommend a well-balanced diet.  DRESSING / WOUND CARE / SHOWERING  Keep the surgical dressing until follow up.  The dressing is water proof, so you can shower without any extra covering.  IF THE DRESSING FALLS OFF or the wound gets  wet inside, change the dressing with sterile gauze.  Please use good hand washing techniques before changing the dressing.  Do not use any lotions or creams on the incision until instructed by your surgeon.    ACTIVITY  Increase activity slowly as tolerated, but follow the weight bearing instructions below.   No driving for 6 weeks or until further direction given by your physician.  You cannot drive while taking narcotics.  No lifting or carrying greater than 10 lbs. until further directed by your surgeon. Avoid periods of inactivity such as sitting longer than an hour when not asleep. This helps prevent blood clots.  You may return to work once you are authorized by your doctor.     WEIGHT BEARING   Weight bearing as tolerated with assist device (walker, cane, etc) as directed, use it as long as suggested by your surgeon or therapist, typically at least 4-6 weeks.   EXERCISES  Results after joint replacement surgery are often greatly improved when you follow the exercise, range of motion and muscle strengthening exercises prescribed by your doctor. Safety measures are also important to protect the joint from further injury. Any time any of these exercises cause you to have increased pain or swelling, decrease what you are doing until you are comfortable again and then slowly increase them. If you have problems or questions, call your caregiver or physical therapist for advice.   Rehabilitation is important following a joint replacement. After just a few days of immobilization, the muscles of the leg can become weakened and shrink (atrophy).  These exercises are designed to build up the tone and strength of the thigh and leg muscles and to improve motion. Often times heat used for twenty to thirty minutes before working out will loosen up your tissues and help with improving the range of motion but do not use heat for the first two weeks following surgery (sometimes heat can increase  post-operative swelling).   These exercises can be done on a training (exercise) mat, on the floor, on a table or on a bed. Use whatever works the best and is most comfortable for you.    Use music or television while you are exercising so that the exercises are a pleasant break in your day. This will make your life better with the exercises acting as a break in your routine that you can look forward to.   Perform all exercises about fifteen times, three times per day or as directed.  You should exercise both the operative leg and the other leg as well.  Exercises include:   Quad Sets - Tighten up the muscle on the front of the thigh (  Quad) and hold for 5-10 seconds.   Straight Leg Raises - With your knee straight (if you were given a brace, keep it on), lift the leg to 60 degrees, hold for 3 seconds, and slowly lower the leg.  Perform this exercise against resistance later as your leg gets stronger.  Leg Slides: Lying on your back, slowly slide your foot toward your buttocks, bending your knee up off the floor (only go as far as is comfortable). Then slowly slide your foot back down until your leg is flat on the floor again.  Angel Wings: Lying on your back spread your legs to the side as far apart as you can without causing discomfort.  Hamstring Strength:  Lying on your back, push your heel against the floor with your leg straight by tightening up the muscles of your buttocks.  Repeat, but this time bend your knee to a comfortable angle, and push your heel against the floor.  You may put a pillow under the heel to make it more comfortable if necessary.   A rehabilitation program following joint replacement surgery can speed recovery and prevent re-injury in the future due to weakened muscles. Contact your doctor or a physical therapist for more information on knee rehabilitation.    CONSTIPATION  Constipation is defined medically as fewer than three stools per week and severe constipation as less  than one stool per week.  Even if you have a regular bowel pattern at home, your normal regimen is likely to be disrupted due to multiple reasons following surgery.  Combination of anesthesia, postoperative narcotics, change in appetite and fluid intake all can affect your bowels.   YOU MUST use at least one of the following options; they are listed in order of increasing strength to get the job done.  They are all available over the counter, and you may need to use some, POSSIBLY even all of these options:    Drink plenty of fluids (prune juice may be helpful) and high fiber foods Colace 100 mg by mouth twice a day  Senokot for constipation as directed and as needed Dulcolax (bisacodyl), take with full glass of water  Miralax (polyethylene glycol) once or twice a day as needed.  If you have tried all these things and are unable to have a bowel movement in the first 3-4 days after surgery call either your surgeon or your primary doctor.    If you experience loose stools or diarrhea, hold the medications until you stool forms back up.  If your symptoms do not get better within 1 week or if they get worse, check with your doctor.  If you experience "the worst abdominal pain ever" or develop nausea or vomiting, please contact the office immediately for further recommendations for treatment.   ITCHING:  If you experience itching with your medications, try taking only a single pain pill, or even half a pain pill at a time.  You can also use Benadryl over the counter for itching or also to help with sleep.   TED HOSE STOCKINGS:  Use stockings on both legs until for at least 2 weeks or as directed by physician office. They may be removed at night for sleeping.  MEDICATIONS:  See your medication summary on the "After Visit Summary" that nursing will review with you.  You may have some home medications which will be placed on hold until you complete the course of blood thinner medication.  It is important  for you to complete the  blood thinner medication as prescribed.  PRECAUTIONS:  If you experience chest pain or shortness of breath - call 911 immediately for transfer to the hospital emergency department.   If you develop a fever greater that 101 F, purulent drainage from wound, increased redness or drainage from wound, foul odor from the wound/dressing, or calf pain - CONTACT YOUR SURGEON.                                                   FOLLOW-UP APPOINTMENTS:  If you do not already have a post-op appointment, please call the office for an appointment to be seen by your surgeon.  Guidelines for how soon to be seen are listed in your "After Visit Summary", but are typically between 1-4 weeks after surgery.  OTHER INSTRUCTIONS:   Knee Replacement:  Do not place pillow under knee, focus on keeping the knee straight while resting. CPM instructions: 0-90 degrees, 2 hours in the morning, 2 hours in the afternoon, and 2 hours in the evening. Place foam block, curve side up under heel at all times except when in CPM or when walking.  DO NOT modify, tear, cut, or change the foam block in any way.  POST-OPERATIVE OPIOID TAPER INSTRUCTIONS: It is important to wean off of your opioid medication as soon as possible. If you do not need pain medication after your surgery it is ok to stop day one. Opioids include: Codeine, Hydrocodone(Norco, Vicodin), Oxycodone(Percocet, oxycontin) and hydromorphone amongst others.  Long term and even short term use of opiods can cause: Increased pain response Dependence Constipation Depression Respiratory depression And more.  Withdrawal symptoms can include Flu like symptoms Nausea, vomiting And more Techniques to manage these symptoms Hydrate well Eat regular healthy meals Stay active Use relaxation techniques(deep breathing, meditating, yoga) Do Not substitute Alcohol to help with tapering If you have been on opioids for less than two weeks and do not have  pain than it is ok to stop all together.  Plan to wean off of opioids This plan should start within one week post op of your joint replacement. Maintain the same interval or time between taking each dose and first decrease the dose.  Cut the total daily intake of opioids by one tablet each day Next start to increase the time between doses. The last dose that should be eliminated is the evening dose.     MAKE SURE YOU:  Understand these instructions.  Get help right away if you are not doing well or get worse.    Thank you for letting us be a part of your medical care team.  It is a privilege we respect greatly.  We hope these instructions will help you stay on track for a fast and full recovery!   Discharge instructions   Complete by: As directed    INSTRUCTIONS AFTER JOINT REPLACEMENT   Remove items at home which could result in a fall. This includes throw rugs or furniture in walking pathways ICE to the affected joint every three hours while awake for 30 minutes at a time, for at least the first 3-5 days, and then as needed for pain and swelling.  Continue to use ice for pain and swelling. You may notice swelling that will progress down to the foot and ankle.  This is normal after surgery.  Elevate your leg  when you are not up walking on it.   Continue to use the breathing machine you got in the hospital (incentive spirometer) which will help keep your temperature down.  It is common for your temperature to cycle up and down following surgery, especially at night when you are not up moving around and exerting yourself.  The breathing machine keeps your lungs expanded and your temperature down.   DIET:  As you were doing prior to hospitalization, we recommend a well-balanced diet.  DRESSING / WOUND CARE / SHOWERING  Keep the surgical dressing until follow up.  The dressing is water proof, so you can shower without any extra covering.  IF THE DRESSING FALLS OFF or the wound gets wet  inside, change the dressing with sterile gauze.  Please use good hand washing techniques before changing the dressing.  Do not use any lotions or creams on the incision until instructed by your surgeon.    ACTIVITY  Increase activity slowly as tolerated, but follow the weight bearing instructions below.   No driving for 6 weeks or until further direction given by your physician.  You cannot drive while taking narcotics.  No lifting or carrying greater than 10 lbs. until further directed by your surgeon. Avoid periods of inactivity such as sitting longer than an hour when not asleep. This helps prevent blood clots.  You may return to work once you are authorized by your doctor.     WEIGHT BEARING   Weight bearing as tolerated with assist device (walker, cane, etc) as directed, use it as long as suggested by your surgeon or therapist, typically at least 4-6 weeks.   EXERCISES  Results after joint replacement surgery are often greatly improved when you follow the exercise, range of motion and muscle strengthening exercises prescribed by your doctor. Safety measures are also important to protect the joint from further injury. Any time any of these exercises cause you to have increased pain or swelling, decrease what you are doing until you are comfortable again and then slowly increase them. If you have problems or questions, call your caregiver or physical therapist for advice.   Rehabilitation is important following a joint replacement. After just a few days of immobilization, the muscles of the leg can become weakened and shrink (atrophy).  These exercises are designed to build up the tone and strength of the thigh and leg muscles and to improve motion. Often times heat used for twenty to thirty minutes before working out will loosen up your tissues and help with improving the range of motion but do not use heat for the first two weeks following surgery (sometimes heat can increase post-operative  swelling).   These exercises can be done on a training (exercise) mat, on the floor, on a table or on a bed. Use whatever works the best and is most comfortable for you.    Use music or television while you are exercising so that the exercises are a pleasant break in your day. This will make your life better with the exercises acting as a break in your routine that you can look forward to.   Perform all exercises about fifteen times, three times per day or as directed.  You should exercise both the operative leg and the other leg as well.  Exercises include:   Quad Sets - Tighten up the muscle on the front of the thigh (Quad) and hold for 5-10 seconds.   Straight Leg Raises - With your knee straight (if  you were given a brace, keep it on), lift the leg to 60 degrees, hold for 3 seconds, and slowly lower the leg.  Perform this exercise against resistance later as your leg gets stronger.  Leg Slides: Lying on your back, slowly slide your foot toward your buttocks, bending your knee up off the floor (only go as far as is comfortable). Then slowly slide your foot back down until your leg is flat on the floor again.  Angel Wings: Lying on your back spread your legs to the side as far apart as you can without causing discomfort.  Hamstring Strength:  Lying on your back, push your heel against the floor with your leg straight by tightening up the muscles of your buttocks.  Repeat, but this time bend your knee to a comfortable angle, and push your heel against the floor.  You may put a pillow under the heel to make it more comfortable if necessary.   A rehabilitation program following joint replacement surgery can speed recovery and prevent re-injury in the future due to weakened muscles. Contact your doctor or a physical therapist for more information on knee rehabilitation.    CONSTIPATION  Constipation is defined medically as fewer than three stools per week and severe constipation as less than one stool  per week.  Even if you have a regular bowel pattern at home, your normal regimen is likely to be disrupted due to multiple reasons following surgery.  Combination of anesthesia, postoperative narcotics, change in appetite and fluid intake all can affect your bowels.   YOU MUST use at least one of the following options; they are listed in order of increasing strength to get the job done.  They are all available over the counter, and you may need to use some, POSSIBLY even all of these options:    Drink plenty of fluids (prune juice may be helpful) and high fiber foods Colace 100 mg by mouth twice a day  Senokot for constipation as directed and as needed Dulcolax (bisacodyl), take with full glass of water  Miralax (polyethylene glycol) once or twice a day as needed.  If you have tried all these things and are unable to have a bowel movement in the first 3-4 days after surgery call either your surgeon or your primary doctor.    If you experience loose stools or diarrhea, hold the medications until you stool forms back up.  If your symptoms do not get better within 1 week or if they get worse, check with your doctor.  If you experience "the worst abdominal pain ever" or develop nausea or vomiting, please contact the office immediately for further recommendations for treatment.   ITCHING:  If you experience itching with your medications, try taking only a single pain pill, or even half a pain pill at a time.  You can also use Benadryl over the counter for itching or also to help with sleep.   TED HOSE STOCKINGS:  Use stockings on both legs until for at least 2 weeks or as directed by physician office. They may be removed at night for sleeping.  MEDICATIONS:  See your medication summary on the "After Visit Summary" that nursing will review with you.  You may have some home medications which will be placed on hold until you complete the course of blood thinner medication.  It is important for you to  complete the blood thinner medication as prescribed.  PRECAUTIONS:  If you experience chest pain or shortness of breath -  call 911 immediately for transfer to the hospital emergency department.   If you develop a fever greater that 101 F, purulent drainage from wound, increased redness or drainage from wound, foul odor from the wound/dressing, or calf pain - CONTACT YOUR SURGEON.                                                   FOLLOW-UP APPOINTMENTS:  If you do not already have a post-op appointment, please call the office for an appointment to be seen by your surgeon.  Guidelines for how soon to be seen are listed in your "After Visit Summary", but are typically between 1-4 weeks after surgery.  OTHER INSTRUCTIONS:   Knee Replacement:  Do not place pillow under knee, focus on keeping the knee straight while resting. CPM instructions: 0-90 degrees, 2 hours in the morning, 2 hours in the afternoon, and 2 hours in the evening. Place foam block, curve side up under heel at all times except when in CPM or when walking.  DO NOT modify, tear, cut, or change the foam block in any way.  POST-OPERATIVE OPIOID TAPER INSTRUCTIONS: It is important to wean off of your opioid medication as soon as possible. If you do not need pain medication after your surgery it is ok to stop day one. Opioids include: Codeine, Hydrocodone(Norco, Vicodin), Oxycodone(Percocet, oxycontin) and hydromorphone amongst others.  Long term and even short term use of opiods can cause: Increased pain response Dependence Constipation Depression Respiratory depression And more.  Withdrawal symptoms can include Flu like symptoms Nausea, vomiting And more Techniques to manage these symptoms Hydrate well Eat regular healthy meals Stay active Use relaxation techniques(deep breathing, meditating, yoga) Do Not substitute Alcohol to help with tapering If you have been on opioids for less than two weeks and do not have pain than it  is ok to stop all together.  Plan to wean off of opioids This plan should start within one week post op of your joint replacement. Maintain the same interval or time between taking each dose and first decrease the dose.  Cut the total daily intake of opioids by one tablet each day Next start to increase the time between doses. The last dose that should be eliminated is the evening dose.     MAKE SURE YOU:  Understand these instructions.  Get help right away if you are not doing well or get worse.    Thank you for letting us be a part of your medical care team.  It is a privilege we respect greatly.  We hope these instructions will help you stay on track for a fast and full recovery!  Take baby aspirin on tablet po twice a day to prevent blood clots.   Driving restrictions   Complete by: As directed    No driving for 3 weeks   Increase activity slowly as tolerated   Complete by: As directed    Increase activity slowly as tolerated   Complete by: As directed    Lifting restrictions   Complete by: As directed    No lifting for 6 weeks   Weight bearing as tolerated   Complete by: As directed      Allergies as of 10/19/2020   No Known Allergies     Medication List    STOP taking these medications  lisinopril-hydrochlorothiazide 10-12.5 MG tablet Commonly known as: ZESTORETIC   potassium chloride SA 20 MEQ tablet Commonly known as: KLOR-CON     TAKE these medications   aspirin EC 81 MG tablet Take 1 tablet (81 mg total) by mouth 2 (two) times daily after a meal. Swallow whole.   cyclobenzaprine 10 MG tablet Commonly known as: FLEXERIL Take 10 mg by mouth 3 (three) times daily as needed for muscle spasms. Notes to patient: Resume home regimen   docusate sodium 100 MG capsule Commonly known as: COLACE Take 1 capsule (100 mg total) by mouth 2 (two) times daily.   DULoxetine 60 MG capsule Commonly known as: CYMBALTA Take 60 mg by mouth daily.   fenofibrate 145 MG  tablet Commonly known as: TRICOR Take 145 mg by mouth daily.   gabapentin 300 MG capsule Commonly known as: NEURONTIN Take 300-600 capsules by mouth 3 (three) times daily. 2 in am, 1 evening, 1 at night   ibuprofen 800 MG tablet Commonly known as: ADVIL Take 200 mg by mouth every 8 (eight) hours as needed for mild pain, headache or fever. Notes to patient: Resume home regimen   methocarbamol 500 MG tablet Commonly known as: ROBAXIN Take 1 tablet (500 mg total) by mouth every 6 (six) hours as needed for muscle spasms.   naloxone 4 MG/0.1ML Liqd nasal spray kit Commonly known as: NARCAN Place 0.4 mg into the nose once. Notes to patient: Resume home regimen   oxyCODONE-acetaminophen 10-325 MG tablet Commonly known as: PERCOCET Take 1 tablet by mouth 4 (four) times daily. What changed: Another medication with the same name was added. Make sure you understand how and when to take each.   oxyCODONE-acetaminophen 5-325 MG tablet Commonly known as: Percocet Take 1-2 tablets by mouth every 4 (four) hours as needed for up to 7 days for severe pain. What changed: You were already taking a medication with the same name, and this prescription was added. Make sure you understand how and when to take each.            Discharge Care Instructions  (From admission, onward)         Start     Ordered   10/15/20 0000  Weight bearing as tolerated        10/15/20 1436         No Known Allergies  Follow-up Information    Leandrew Koyanagi, MD In 2 weeks.   Specialty: Orthopedic Surgery Why: For suture removal, For wound re-check Contact information: [place] [id number] [phone]        Sandi Mariscal, MD Follow up.   Specialty: Internal Medicine Why: Please call and make a hospital follow up appointment in the next 7-10 days. Contact information: [place] [address] [phone]        [facility]  Follow up.   Specialty: Rehabilitation Why: [facility] will be calling to set you up with physical therapy services at the [facility] location since home health services were unavailable in your area for PT. Contact information: [place] [phone] [place] [phone]               The results of significant diagnostics from this hospitalization (including imaging, microbiology, ancillary and laboratory) are listed below for reference.    Significant Diagnostic Studies: DG Chest 1 View  Result Date: 10/13/2020 CLINICAL DATA:  Left hip fracture EXAM: CHEST  1 VIEW COMPARISON:  None. FINDINGS: The heart size and mediastinal contours are within normal limits. Both lungs are clear. The visualized skeletal structures are unremarkable. IMPRESSION: No active disease. Electronically Signed   By: Fidela Salisbury MD   On: 10/13/2020 00:14   Pelvis Portable  Result Date: 10/15/2020 CLINICAL DATA:  Postop left hip replacement. EXAM: PORTABLE PELVIS 1-2 VIEWS COMPARISON:  Preoperative radiographs 10/12/2020 FINDINGS: Left hip arthroplasty in expected alignment. No periprosthetic lucency or fracture. Recent postsurgical change includes air and edema in the soft tissues. Remote right inferior ramus fracture. IMPRESSION: Left hip arthroplasty without immediate postoperative complication. Electronically Signed   By: Keith Rake M.D.   On: 10/15/2020 18:08   CT HIP LEFT WO CONTRAST  Addendum Date: 10/14/2020   ADDENDUM REPORT: 10/14/2020 13:38 ADDENDUM: This addendum is given for the purpose of clarifying the laterality of the patient's remote healed pubic ramus fracture. The fracture is on the right and not the left as stated in the body of the report. Electronically Signed   By: Inge Rise M.D.   On: 10/14/2020 13:38   Result Date: 10/14/2020 CLINICAL DATA:  Left hip pain since a fall 10/11/2020. Initial encounter. EXAM: CT OF THE  LEFT HIP WITHOUT CONTRAST TECHNIQUE: Multidetector CT imaging of the left hip was performed according to the standard protocol. Multiplanar CT image reconstructions were also generated. COMPARISON:  Plain films left hip 10/12/2020. CT of the pelvis 05/16/2018. FINDINGS: Bones/Joint/Cartilage As seen on the comparison plain films, the patient has a subcapital fracture of the left hip. Although the patient suffered a recent fall, the fracture appears remote with corticated fracture margins and remodeling of the femoral neck. The femoral head is located. The shaft of the femur is superiorly displaced approximately 1.5 cm. Remote healed left inferior pubic ramus fracture is seen. Left hip joint space is preserved. A small bone island in the left ilium is unchanged since 2019. No worrisome bony lesion is identified. Vacuum disc phenomenon at L4-5 is partially imaged. The patient is status post L5-S1 fusion. Ligaments Suboptimally assessed by CT. Muscles and Tendons Appear intact. Soft tissues No acute or focal abnormality. IMPRESSION: Subcapital fracture of the left hip is new since 2019 but the fracture appears remote as described above. Remote healed right inferior pubic ramus fracture. Degenerative disc disease L4-5. The patient is status post L5-S1 fusion. Electronically Signed: By: Inge Rise M.D. On: 10/13/2020 07:23   DG C-Arm 1-60 Min  Result Date: 10/15/2020 CLINICAL DATA:  Total hip arthroplasty. EXAM: OPERATIVE LEFT HIP (WITH PELVIS IF PERFORMED) TECHNIQUE: Fluoroscopic spot image(s) were submitted for interpretation post-operatively. COMPARISON:  Preoperative imaging. FINDINGS: Four fluoroscopic spot views obtained in the operating room. Interval left hip arthroplasty in expected alignment. Total fluoroscopy time 24 seconds. Total dose 1.34 mGy. IMPRESSION: Procedural fluoroscopy during left hip arthroplasty. Electronically Signed   By: Keith Rake M.D.   On: 10/15/2020 16:57   DG HIP  OPERATIVE UNILAT W OR W/O PELVIS LEFT  Result Date: 10/15/2020 CLINICAL DATA:  Total hip arthroplasty. EXAM: OPERATIVE LEFT HIP (WITH PELVIS IF PERFORMED) TECHNIQUE: Fluoroscopic spot image(s) were submitted for interpretation post-operatively. COMPARISON:  Preoperative imaging. FINDINGS: Four fluoroscopic spot views obtained in the operating room. Interval left hip arthroplasty in expected alignment. Total fluoroscopy time 24 seconds. Total dose 1.34 mGy. IMPRESSION: Procedural fluoroscopy during left hip arthroplasty. Electronically Signed   By: Keith Rake M.D.   On: 10/15/2020 16:57   DG Hip Unilat W or Wo Pelvis 2-3 Views Left  Result  Date: 10/13/2020 CLINICAL DATA:  Fall, left hip pain EXAM: DG HIP (WITH OR WITHOUT PELVIS) 2-3V LEFT COMPARISON:  None. FINDINGS: Single view radiograph of the pelvis and two view radiograph of the left hip demonstrate an acute left subcapital femoral neck fracture with superolateral displacement and external rotation of the distal fracture fragment. The femoral head is still seated within the left acetabulum. Left hip joint space appears preserved. Limited evaluation of the right hip is unremarkable. Lumbosacral fusion with instrumentation has been performed, not well assessed. IMPRESSION: Acute, displaced, rotated left subcapital femoral neck fracture. Electronically Signed   By: Fidela Salisbury MD   On: 10/13/2020 00:13    Microbiology: Recent Results (from the past 240 hour(s))  Resp Panel by RT-PCR (Flu A&B, Covid) Nasopharyngeal Swab     Status: None   Collection Time: 10/13/20 12:28 AM   Specimen: Nasopharyngeal Swab; Nasopharyngeal(NP) swabs in vial transport medium  Result Value Ref Range Status   SARS Coronavirus 2 by RT PCR NEGATIVE NEGATIVE Final    Comment: (NOTE) SARS-CoV-2 target nucleic acids are NOT DETECTED.  The SARS-CoV-2 RNA is generally detectable in upper respiratory specimens during the acute phase of infection. The  lowest concentration of SARS-CoV-2 viral copies this assay can detect is 138 copies/mL. A negative result does not preclude SARS-Cov-2 infection and should not be used as the sole basis for treatment or other patient management decisions. A negative result may occur with  improper specimen collection/handling, submission of specimen other than nasopharyngeal swab, presence of viral mutation(s) within the areas targeted by this assay, and inadequate number of viral copies(<138 copies/mL). A negative result must be combined with clinical observations, patient history, and epidemiological information. The expected result is Negative.  Fact Sheet for Patients:  EntrepreneurPulse.com.au  Fact Sheet for Healthcare Providers:  IncredibleEmployment.be  This test is no t yet approved or cleared by the [place] FDA and  has been authorized for detection and/or diagnosis of SARS-CoV-2 by FDA under an Emergency Use Authorization (EUA). This EUA will remain  in effect (meaning this test can be used) for the duration of the COVID-19 declaration under Section 564(b)(1) of the Act, 21 U.S.C.section 360bbb-3(b)(1), unless the authorization is terminated  or revoked sooner.       Influenza A by PCR NEGATIVE NEGATIVE Final   Influenza B by PCR NEGATIVE NEGATIVE Final    Comment: (NOTE) The Xpert Xpress SARS-CoV-2/FLU/RSV plus assay is intended as an aid in the diagnosis of influenza from Nasopharyngeal swab specimens and should not be used as a sole basis for treatment. Nasal washings and aspirates are unacceptable for Xpert Xpress SARS-CoV-2/FLU/RSV testing.  Fact Sheet for Patients: EntrepreneurPulse.com.au  Fact Sheet for Healthcare Providers: IncredibleEmployment.be  This test is not yet approved or cleared by the [place] FDA and has been authorized for detection and/or diagnosis of SARS-CoV-2 by FDA under  an Emergency Use Authorization (EUA). This EUA will remain in effect (meaning this test can be used) for the duration of the COVID-19 declaration under Section 564(b)(1) of the Act, 21 U.S.C. section 360bbb-3(b)(1), unless the authorization is terminated or revoked.  Performed at [facility], [address], [place], [place] [address]   Surgical PCR screen     Status: None   Collection Time: 10/13/20  5:46 PM   Specimen: Nasal Mucosa; Nasal Swab  Result Value Ref Range Status   MRSA, PCR NEGATIVE NEGATIVE Final   Staphylococcus aureus NEGATIVE NEGATIVE Final    Comment: (NOTE) The Xpert SA  Assay (FDA approved for NASAL specimens in patients 51 years of age and older), is one component of a comprehensive surveillance program. It is not intended to diagnose infection nor to guide or monitor treatment. Performed at [facility], [place] [address], [place], [place] [address]      Labs: Basic Metabolic Panel: Recent Labs  Lab 10/13/20 0405 10/14/20 0306 10/15/20 0701 10/16/20 0214 10/17/20 0256 10/18/20 0117  NA  --  135 135 133* 133* 132*  K  --  4.1 3.5 3.8 3.7 4.3  CL  --  101 100 100 101 99  CO2  --  _0 GLUCOSE  --  112* 95 108* 106* 99  BUN  --  _1 CREATININE  --  0.63 0.57 0.70 0.72 0.76  CALCIUM  --  9.7 9.3 9.1 8.8* 9.1  MG 1.8 1.9 1.9 1.9  --   --   PHOS 3.1  --   --  4.1  --   --    Liver Function Tests: Recent Labs  Lab 10/14/20 0306 10/16/20 0214  AST 34 22  ALT 34 25  ALKPHOS 73 64  BILITOT 0.6 0.5  PROT 6.6 6.3*  ALBUMIN 3.1* 2.7*   No results for input(s): LIPASE, AMYLASE in the last 168 hours. No results for input(s): AMMONIA in the last 168 hours. CBC: Recent Labs  Lab 10/13/20 0026 10/14/20 0306 10/15/20 0701 10/16/20 0214 10/17/20 0256 10/18/20 0117 10/19/20 1130  WBC 11.3*   < > 8.3 11.3* 11.2* 12.0* 8.8  NEUTROABS 8.6*  --  4.2  --   --   --   --   HGB 11.6*   < > 11.1* 9.7* 8.7* 8.7* 8.3*  HCT 35.6*   <  > 32.5* 29.3* 26.2* 26.7* 25.3*  MCV 90.8   < > 86.7 89.3 88.8 90.8 87.8  PLT 549*   < > 565* 545* 527* 570* 611*   < > = values in this interval not displayed.   Cardiac Enzymes: No results for input(s): CKTOTAL, CKMB, CKMBINDEX, TROPONINI in the last 168 hours. BNP: BNP (last 3 results) No results for input(s): BNP in the last 8760 hours.  ProBNP (last 3 results) No results for input(s): PROBNP in the last 8760 hours.  CBG: No results for input(s): GLUCAP in the last 168 hours.     Signed:  Kayleen Memos, MD [facility] 10/19/2020, 3:33 PM

## 2020-10-19 NOTE — Discharge Instructions (Signed)

## 2020-10-19 NOTE — Progress Notes (Signed)
Pt stated that she was hesitant to go home since she would not have any pain med to take when she gets home, her pharmacy closes at 18:00 on Sundays.   Explained to Pt her recent refill of Percocet 10-325mg  and she remembers that she still has some at home.  Provided discharge education/instructions, all questions and concerns addressed, Pt not in distress, discharged home with belongings accompanied by husband.

## 2020-10-19 NOTE — Progress Notes (Addendum)
Pt educated on pain control and concerns about safety when getting IV narcotic pain med prior to going home. Pt was informed that her BP is soft and MD made aware. Pt states she understands but very upset about being unable to get IV pain med. Per MD and protocol give only PO pain med prior to discharge. Received an order for Toradol once.

## 2020-10-19 NOTE — TOC Progression Note (Addendum)
Transition of Care [facility]) - Progression Note    Patient Details  Name: Allison Berry MRN: [id number] Date of Birth: [birth year]  Transition of Care [facility]) CM/SW [place], RN Phone Number: 10/19/2020, 1:52 PM  Clinical Narrative:    Case management noted discharge order and discharge summary placed on the patient for discharge to home today.  CM called and spoke with the patient on the phone and she is planning on discharging home to her listed address to have family supports care for her at home instead on SNF placement.  The patient states that she has sister during the day for care and spouse at night. The patient states that her husband plans to pick her up by car this evening to transport her home when he finished working for the day.  The patient states that she is an available RW and 3:1 at home already from a previous injury.  The patient was given choice regarding home health and did not have a preference.  I called and spoke with [place], [place] with [place] [facility] on the phone and they are checking availability of [facility] for PT in the patient's area.  The patient states that she has available transportation, so if [facility] was unable to support services for home health - CM plans to set patient up with [facility] at [facility] location.  10/19/2020 1410 - [place], [place] with [place] was unable to provide home health PT.  Patient was set up with outpatient PT with [facility] outpatient PT clinic in [place], [place].  [place] will continue to follow the patient for discharge to home today.  No other TOC needs and patient can be discharged to home.   Expected Discharge Plan: [place] Barriers to Discharge: No Barriers Identified  Expected Discharge Plan and Services Expected Discharge Plan: [place] In-house Referral: PCP / [facility] Discharge Planning Services: CM Consult Post Acute Care Choice: [place] arrangements for the past 2 months: Single Family Home Expected Discharge Date: 10/19/20                         HH Arranged: PT [place]: [place] (now [facility]) Date [place]: 10/19/20 Time [place]: [place] Representative spoke with at [place]: [place], [place] with [facility] (previously [facility])   Social Determinants of Health ([place]) Interventions    Readmission Risk Interventions No flowsheet data found.

## 2020-10-28 ENCOUNTER — Telehealth: Payer: Self-pay | Admitting: Physical Therapy

## 2020-10-28 ENCOUNTER — Ambulatory Visit: Payer: Medicaid Other | Attending: Internal Medicine | Admitting: Physical Therapy

## 2020-10-28 NOTE — Telephone Encounter (Signed)
L/m to r/s -no show evaluation

## 2020-11-07 ENCOUNTER — Ambulatory Visit: Payer: Medicaid Other | Admitting: Orthopaedic Surgery

## 2020-11-11 ENCOUNTER — Ambulatory Visit: Payer: Medicaid Other | Attending: Internal Medicine | Admitting: Physical Therapy

## 2020-11-18 ENCOUNTER — Ambulatory Visit (INDEPENDENT_AMBULATORY_CARE_PROVIDER_SITE_OTHER): Payer: Medicaid Other

## 2020-11-18 ENCOUNTER — Other Ambulatory Visit: Payer: Self-pay

## 2020-11-18 ENCOUNTER — Ambulatory Visit (INDEPENDENT_AMBULATORY_CARE_PROVIDER_SITE_OTHER): Payer: Medicaid Other | Admitting: Physician Assistant

## 2020-11-18 DIAGNOSIS — S72002A Fracture of unspecified part of neck of left femur, initial encounter for closed fracture: Secondary | ICD-10-CM | POA: Diagnosis not present

## 2020-11-18 MED ORDER — METHOCARBAMOL 500 MG PO TABS: 500.0000 mg | ORAL_TABLET | Freq: Two times a day (BID) | ORAL | 0 refills | Status: DC | PRN

## 2020-11-18 NOTE — Progress Notes (Signed)
Post-Op Visit Note   Patient: Allison Berry           Date of Birth: 10-07-1965           MRN: [id number] Visit Date: 11/18/2020 PCP: System, Provider Not In   Assessment & Plan:  Chief Complaint:  Chief Complaint  Patient presents with  . Left Hip - Pain, Routine Post Op   Visit Diagnoses:  1. Left displaced femoral neck fracture (HCC)     Plan: Patient is a pleasant 55 year old female who comes in today almost 5 weeks out left anterior total hip replacement from a femoral neck fracture.  This is her first postoperative visit.  She has not been getting physical therapy but has been working on a home exercise program.  She notes that she removed the sutures herself.  She also notes that she sustained a mechanical fall after slipping on close landing on her buttocks.  This was last week.  She has had increased pain since.  She is on chronic oxycodone which is minimally helping.  She is currently ambulating with a cane.  Examination of her left hip reveals a painless logroll and FADIR.  She has slightly decreased strength with resisted hip flexion.  No pain or weakness with resisted hip abduction or abduction.  She is neurovascularly intact distally.  At this point, she will continue to advance with activity as tolerated.  I have placed an internal referral for physical therapy.  She will follow-up with [place] in 4 weeks time for repeat evaluation and AP pelvis x-rays.  Call with concerns or questions in the meantime.  Follow-Up Instructions: Return in about 4 weeks (around 12/16/2020).   Orders:  Orders Placed This Encounter  Procedures  . XR HIP UNILAT W OR W/O PELVIS 2-3 VIEWS LEFT  . Ambulatory referral to Physical Therapy   Meds ordered this encounter  Medications  . methocarbamol (ROBAXIN) 500 MG tablet    Sig: Take 1 tablet (500 mg total) by mouth 2 (two) times daily as needed.    Dispense:  20 tablet    Refill:  0    Imaging: XR HIP UNILAT W OR W/O PELVIS 2-3 VIEWS  LEFT  Result Date: 11/18/2020 Well-seated prosthesis without complication.  No acute fracture noted.   PMFS History: Patient Active Problem List   Diagnosis Date Noted  . Left displaced femoral neck fracture ([place]) 10/13/2020  . Accidental fall 10/13/2020  . Leukocytosis 10/13/2020  . Normocytic anemia 10/13/2020  . Thrombocytosis 07/21/2018  . Altered mental status 07/20/2018  . AKI (acute kidney injury) ([place]) 07/20/2018  . Hypertension 07/20/2018  . Chronic back pain 07/20/2018  . Dyslipidemia 07/20/2018  . Hypokalemia 07/20/2018  . Hypomagnesemia 07/20/2018  . Hypokalemia 07/20/2018   Past Medical History:  Diagnosis Date  . Chronic back pain 07/20/2018  . Dyslipidemia 07/20/2018  . Hypertension 07/20/2018    No family history on file.  Past Surgical History:  Procedure Laterality Date  . TOTAL HIP ARTHROPLASTY Left 10/15/2020   Procedure: TOTAL HIP ARTHROPLASTY ANTERIOR APPROACH;  Surgeon: Leandrew Koyanagi, MD;  Location: [place];  Service: Orthopedics;  Laterality: Left;   Social History   Occupational History  . Not on file  Tobacco Use  . Smoking status: Current Every Day Smoker    Packs/day: 0.25    Types: Cigarettes  . Smokeless tobacco: Never Used  Substance and Sexual Activity  . Alcohol use: Not on file  . Drug use: Not on file  .  Sexual activity: Not on file

## 2020-12-04 ENCOUNTER — Ambulatory Visit: Payer: Medicaid Other | Attending: Physician Assistant | Admitting: Physical Therapy

## 2020-12-04 ENCOUNTER — Other Ambulatory Visit: Payer: Self-pay

## 2020-12-04 ENCOUNTER — Encounter: Payer: Self-pay | Admitting: Physical Therapy

## 2020-12-04 DIAGNOSIS — M25552 Pain in left hip: Secondary | ICD-10-CM | POA: Diagnosis present

## 2020-12-04 NOTE — Therapy (Signed)
[facility] [place], [place], [address] Phone: [phone]   Fax:  [phone]  Physical Therapy Evaluation  Patient Details  Name: Allison Berry MRN: [id number] Date of Birth: Feb 09, 1966 Referring Provider (PT): Tiney Rouge   Encounter Date: 12/04/2020   PT End of Session - 12/04/20 1427    Visit Number 1    Number of Visits 12    Date for PT Re-Evaluation 01/15/21    PT Start Time 0200    PT Stop Time 0226    PT Time Calculation (min) 26 min    Activity Tolerance Patient tolerated treatment well    Behavior During Therapy [facility] for tasks assessed/performed           Past Medical History:  Diagnosis Date  . Chronic back pain 07/20/2018  . Dyslipidemia 07/20/2018  . Hypertension 07/20/2018    Past Surgical History:  Procedure Laterality Date  . TOTAL HIP ARTHROPLASTY Left 10/15/2020   Procedure: TOTAL HIP ARTHROPLASTY ANTERIOR APPROACH;  Surgeon: Leandrew Koyanagi, MD;  Location: [facility];  Service: Orthopedics;  Laterality: Left;    There were no vitals filed for this visit.    Subjective Assessment - 12/04/20 1427    Subjective COVID-19 screen performed prior to patient entering clinic.  The patient presents to the clinic today s/p left anterior total hip replacement performed 10/15/20.  She had a fall but a follow-up X-ray on 11/18/20 showed her components were in place.  Her pain is rated at an 8/10 today increasing with walking, bending and increased movement. She reports her left hip "pops" on occasions. Rest and medication decrease her pain.    Pertinent History Previous pelvic fracture, back surgery, HTN, OP.    How long can you sit comfortably? Unlimited.    How long can you stand comfortably? Varies.    How long can you walk comfortably? Short community distances.    Diagnostic tests X-ray.    Patient Stated Goals Be able to exercise again.    Currently in Pain? Yes    Pain Score 8     Pain Location Back     Pain Orientation Left    Pain Descriptors / Indicators Aching;Throbbing;Numbness   "Stinging."   Pain Type Chronic pain    Pain Radiating Towards Left groin and anterior thigh.    Pain Onset More than a month ago    Pain Frequency Constant    Aggravating Factors  See above.    Pain Relieving Factors See above.              [facility] PT Assessment - 12/04/20 0001      Assessment   Medical Diagnosis Left anterior total hip replacement.    Referring Provider (PT) Tiney Rouge    Onset Date/Surgical Date --   10/15/20 (surgery date).     Precautions   Precaution Comments Left anterior total hip replacement.  No ultrasound.      Restrictions   Weight Bearing Restrictions No      Balance Screen   Has the patient fallen in the past 6 months Yes    How many times? 2.    Has the patient had a decrease in activity level because of a fear of falling?  No    Is the patient reluctant to leave their home because of a fear of falling?  No      Home [employer] residence      Prior Function  Level of Independence Independent      Posture/Postural Control   Posture/Postural Control Postural limitations    Postural Limitations Flexed trunk      ROM / Strength   AROM / PROM / Strength AROM;Strength      AROM   Overall AROM Comments In supine:  Left hip to 85 degrees without difficulty or pain increase.      Strength   Overall Strength Comments Left hip flexion and abduction is 4 to 4+/5, left knee extension is a solid 4+/5.      Palpation   Palpation comment Some tenderness to palpation over her left hip incisional site.      Ambulation/Gait   Gait Comments The patient walks in some trunk flexion with straight cane on right with a slow and cautious gait pattern with a decrease in step and stride and length.                      Objective measurements completed on examination: See above findings.                    PT  Long Term Goals - 12/04/20 1501      PT LONG TERM GOAL #1   Title Independent with a HEP.    Baseline No knowledge of appropriate ther ex.    Time 6    Status New      PT LONG TERM GOAL #2   Title Solid 5/5 left hip strength to increase stability for functional tasks.    Baseline 4 to 4+/5.    Time 6    Period Weeks    Status New      PT LONG TERM GOAL #3   Title Perform ADL's with pain not > 4/10.    Baseline 8+/10 with performance of ADL's.    Time 6    Period Weeks    Status New      PT LONG TERM GOAL #4   Title Patient able to perform a reciprocating stair gait with one railing.    Baseline Non-reciprocating stair gait currently.    Time 6    Period Weeks    Status New                  Plan - 12/04/20 1455    Clinical Impression Statement The patient presents to OPPT s/p left anterior total hip replacement performed on 10/15/20.  She has had a fall since the but X-rays look good.  She is still in a lot of pain with referred pain into her left groin and anterior thigh.  She has some palpable pain over her left hip incisional site.  She has some loss of left hip strength.  Her gait is remarkable for walking in trunk flexion and her gait is slow and cautious with straight cane usage on her right sided.  She has occasions of popping in her left hip. Patient will benefit from skilled physical therapy intervention to address pain and deficits.    Personal Factors and Comorbidities Other;Comorbidity 1    Comorbidities Previous pelvic fracture, back surgery, HTN, OP.    Examination-Activity Limitations Locomotion Level;Other    Examination-Participation Restrictions Other    Stability/Clinical Decision Making Stable/Uncomplicated    Clinical Decision Making Low    Rehab Potential Excellent    PT Frequency 2x / week    PT Duration 6 weeks    PT Treatment/Interventions ADLs/Self Care Home Management;Cryotherapy;Electrical Stimulation;Moist Heat;Gait training;Stair  training;Functional mobility training;Therapeutic activities;Neuromuscular re-education;Therapeutic exercise;Manual techniques;Patient/family education;Scar mobilization    PT Next Visit Plan Nustep, progress to recumbent bike, left LE strengthening, Gait training and activites.  She perform a seesion or two over patient's left hip incisional site.    Consulted and Agree with Plan of Care Patient           Patient will benefit from skilled therapeutic intervention in order to improve the following deficits and impairments:  Abnormal gait,Difficulty walking,Decreased activity tolerance,Pain,Increased muscle spasms,Decreased strength  Visit Diagnosis: Pain in left hip - Plan: PT plan of care cert/re-cert     Problem List Patient Active Problem List   Diagnosis Date Noted  . Left displaced femoral neck fracture ([place]) 10/13/2020  . Accidental fall 10/13/2020  . Leukocytosis 10/13/2020  . Normocytic anemia 10/13/2020  . Thrombocytosis 07/21/2018  . Altered mental status 07/20/2018  . AKI (acute kidney injury) ([place]) 07/20/2018  . Hypertension 07/20/2018  . Chronic back pain 07/20/2018  . Dyslipidemia 07/20/2018  . Hypokalemia 07/20/2018  . Hypomagnesemia 07/20/2018  . Hypokalemia 07/20/2018    Deysi Soldo, [place] MPT 12/04/2020, 3:05 PM  [facility] [address] [place], [place], [address] Phone: [phone]   Fax:  [phone]  Name: Allison Berry MRN: [id number] Date of Birth: Aug 23, 1965

## 2020-12-09 ENCOUNTER — Encounter: Payer: Medicaid Other | Admitting: Orthopaedic Surgery

## 2020-12-10 ENCOUNTER — Encounter: Payer: Self-pay | Admitting: Orthopaedic Surgery

## 2020-12-10 ENCOUNTER — Other Ambulatory Visit: Payer: Self-pay

## 2020-12-10 ENCOUNTER — Ambulatory Visit (INDEPENDENT_AMBULATORY_CARE_PROVIDER_SITE_OTHER): Payer: Medicaid Other

## 2020-12-10 ENCOUNTER — Ambulatory Visit (INDEPENDENT_AMBULATORY_CARE_PROVIDER_SITE_OTHER): Payer: Medicaid Other | Admitting: Orthopaedic Surgery

## 2020-12-10 VITALS — Ht 66.0 in | Wt 142.0 lb

## 2020-12-10 DIAGNOSIS — S72002A Fracture of unspecified part of neck of left femur, initial encounter for closed fracture: Secondary | ICD-10-CM | POA: Diagnosis not present

## 2020-12-10 MED ORDER — PREDNISONE 10 MG (21) PO TBPK: ORAL_TABLET | ORAL | 0 refills | Status: DC

## 2020-12-10 MED ORDER — METHOCARBAMOL 500 MG PO TABS: 500.0000 mg | ORAL_TABLET | Freq: Four times a day (QID) | ORAL | 2 refills | Status: DC | PRN

## 2020-12-10 MED ORDER — DICLOFENAC SODIUM 75 MG PO TBEC: 75.0000 mg | DELAYED_RELEASE_TABLET | Freq: Two times a day (BID) | ORAL | 2 refills | Status: DC

## 2020-12-10 NOTE — Progress Notes (Signed)
Office Visit Note   Patient: Allison Berry           Date of Birth: 09-30-1965           MRN: [id number] Visit Date: 12/10/2020              Requested by: No referring provider defined for this encounter. PCP: System, Provider Not In   Assessment & Plan: Visit Diagnoses:  1. Left displaced femoral neck fracture ([place])     Plan: Impression is overuse of left hip flexors from overactivity.  She understands to back off on activity.  I have sent in a prescription for diclofenac, Robaxin, prednisone Dosepak if she needs it.  She will gradually resume activities when she feels better.  Recheck in 6 weeks.  Follow-Up Instructions: Return in about 6 weeks (around 01/21/2021).   Orders:  Orders Placed This Encounter  Procedures  . XR Pelvis 1-2 Views   Meds ordered this encounter  Medications  . diclofenac (VOLTAREN) 75 MG EC tablet    Sig: Take 1 tablet (75 mg total) by mouth 2 (two) times daily.    Dispense:  30 tablet    Refill:  2  . methocarbamol (ROBAXIN) 500 MG tablet    Sig: Take 1 tablet (500 mg total) by mouth every 6 (six) hours as needed for muscle spasms.    Dispense:  30 tablet    Refill:  2  . predniSONE (STERAPRED UNI-PAK 21 TAB) 10 MG (21) TBPK tablet    Sig: Take as directed    Dispense:  21 tablet    Refill:  0      Procedures: No procedures performed   Clinical Data: No additional findings.   Subjective: Chief Complaint  Patient presents with  . Left Hip - Follow-up    Left total hip arthroplasty 10/15/2020    Shares approximately 2 months status post left total hip replacement for femoral neck fracture.  She states that she had acute pain in her left thigh down to the knee starting Sunday.  She was doing a lot of vacuuming around the house.  She states that the pain is actually worse when she is not weightbearing.  Denies any constitutional symptoms.   Review of Systems   Objective: Vital Signs: Ht 5\' 6"  (1.676 m)   Wt 142 lb (64.4 kg)    BMI 22.92 kg/m   Physical Exam  Ortho Exam Left hip shows a fully healed surgical scar without any pain with attempted range of motion.  No signs of infection. Specialty Comments:  No specialty comments available.  Imaging: XR Pelvis 1-2 Views  Result Date: 12/10/2020 Stable total hip replacement without complications    PMFS History: Patient Active Problem List   Diagnosis Date Noted  . Left displaced femoral neck fracture ([place]) 10/13/2020  . Accidental fall 10/13/2020  . Leukocytosis 10/13/2020  . Normocytic anemia 10/13/2020  . Thrombocytosis 07/21/2018  . Altered mental status 07/20/2018  . AKI (acute kidney injury) ([place]) 07/20/2018  . Hypertension 07/20/2018  . Chronic back pain 07/20/2018  . Dyslipidemia 07/20/2018  . Hypokalemia 07/20/2018  . Hypomagnesemia 07/20/2018  . Hypokalemia 07/20/2018   Past Medical History:  Diagnosis Date  . Chronic back pain 07/20/2018  . Dyslipidemia 07/20/2018  . Hypertension 07/20/2018    History reviewed. No pertinent family history.  Past Surgical History:  Procedure Laterality Date  . TOTAL HIP ARTHROPLASTY Left 10/15/2020   Procedure: TOTAL HIP ARTHROPLASTY ANTERIOR APPROACH;  Surgeon: Leandrew Koyanagi,  MD;  Location: [facility];  Service: Orthopedics;  Laterality: Left;   Social History   Occupational History  . Not on file  Tobacco Use  . Smoking status: Current Every Day Smoker    Packs/day: 0.25    Types: Cigarettes  . Smokeless tobacco: Never Used  Substance and Sexual Activity  . Alcohol use: Not on file  . Drug use: Not on file  . Sexual activity: Not on file

## 2020-12-25 ENCOUNTER — Ambulatory Visit: Payer: Medicaid Other | Admitting: Physical Therapy

## 2021-01-01 ENCOUNTER — Ambulatory Visit: Payer: Medicaid Other | Attending: Physician Assistant | Admitting: *Deleted

## 2021-01-13 ENCOUNTER — Other Ambulatory Visit: Payer: Self-pay | Admitting: Specialist

## 2021-01-14 ENCOUNTER — Other Ambulatory Visit: Payer: Self-pay | Admitting: Specialist

## 2021-01-21 ENCOUNTER — Encounter: Payer: Medicaid Other | Admitting: Orthopaedic Surgery

## 2021-02-18 ENCOUNTER — Other Ambulatory Visit: Payer: Self-pay | Admitting: Nurse Practitioner

## 2021-02-18 ENCOUNTER — Other Ambulatory Visit: Payer: Self-pay | Admitting: Chiropractic Medicine

## 2021-02-18 DIAGNOSIS — Z720 Tobacco use: Secondary | ICD-10-CM

## 2021-03-10 ENCOUNTER — Other Ambulatory Visit: Payer: Medicaid Other

## 2021-03-28 ENCOUNTER — Other Ambulatory Visit: Payer: Medicaid Other

## 2021-09-04 IMAGING — RF DG HIP (WITH PELVIS) OPERATIVE*L*
1 series · 4 of 4 positions shown · non-contrast
Comparison: Preoperative imaging.

CLINICAL DATA: Total hip arthroplasty.

EXAM:
OPERATIVE LEFT HIP (WITH PELVIS IF PERFORMED)
TECHNIQUE: Fluoroscopic spot image(s) were submitted for interpretation
post-operatively.

[Series 1: unknown protocol · 0.20mm/px · 4 of 4 slices shown]
[im 1/4]
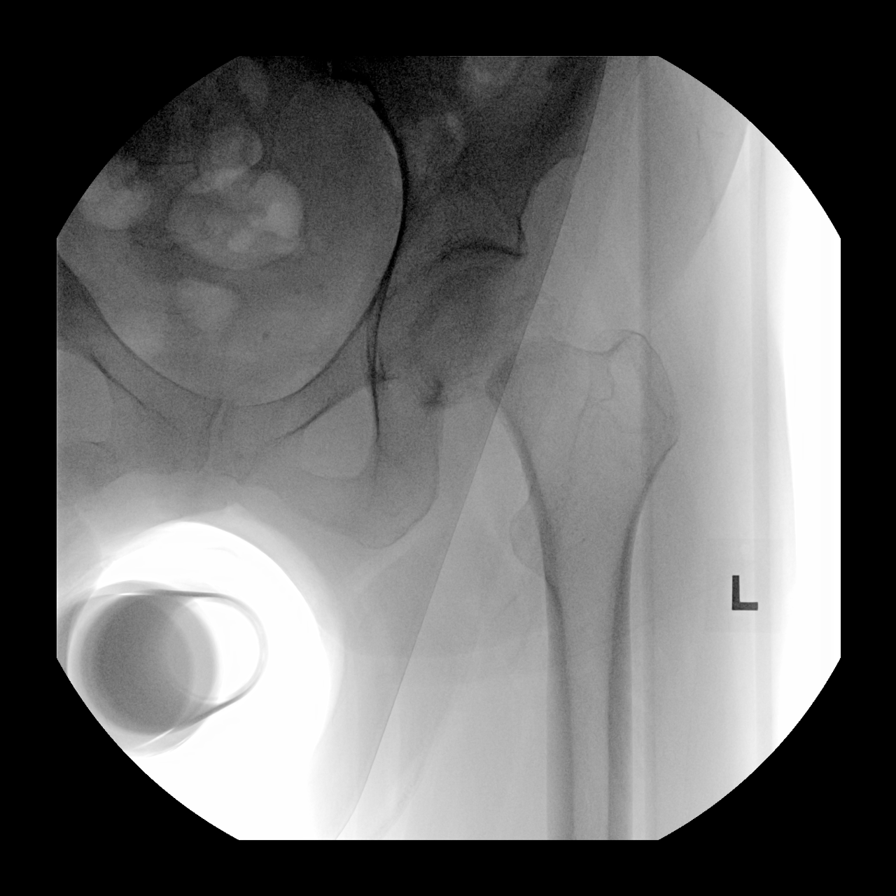
[im 2/4]
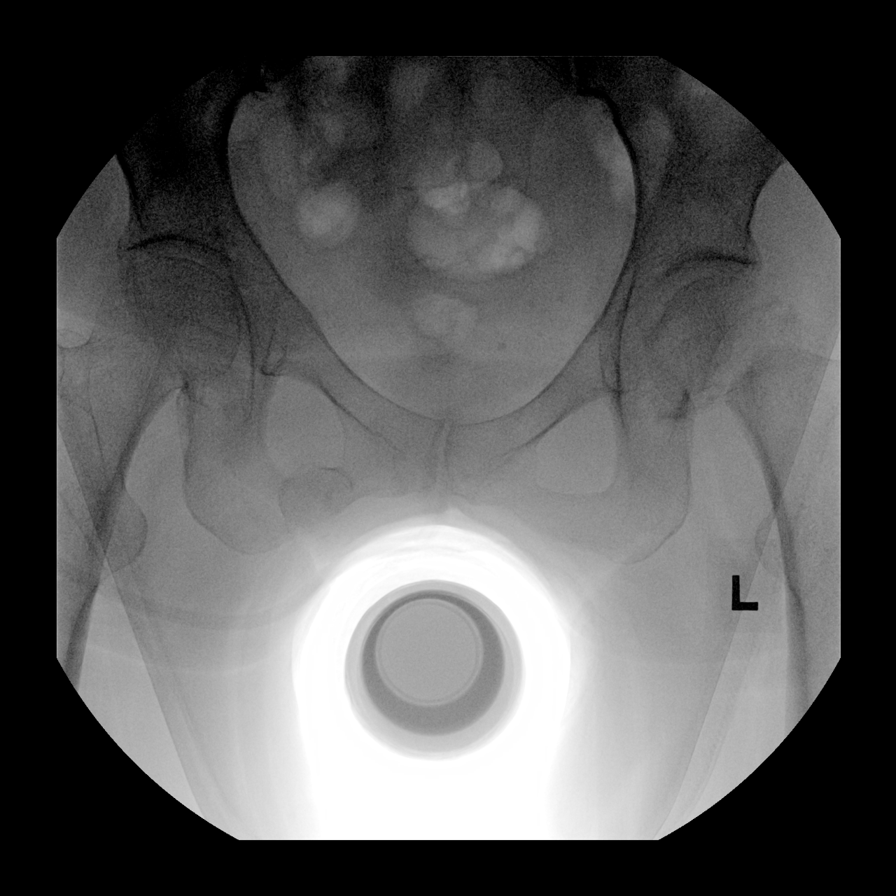
[im 3/4]
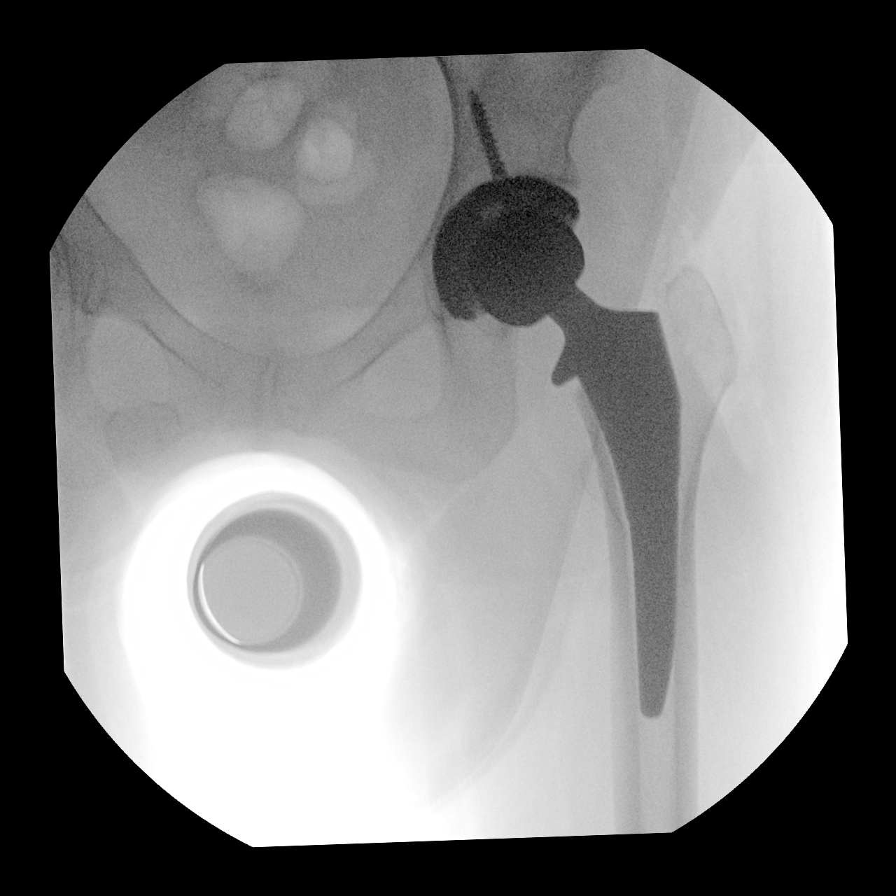
[im 4/4]
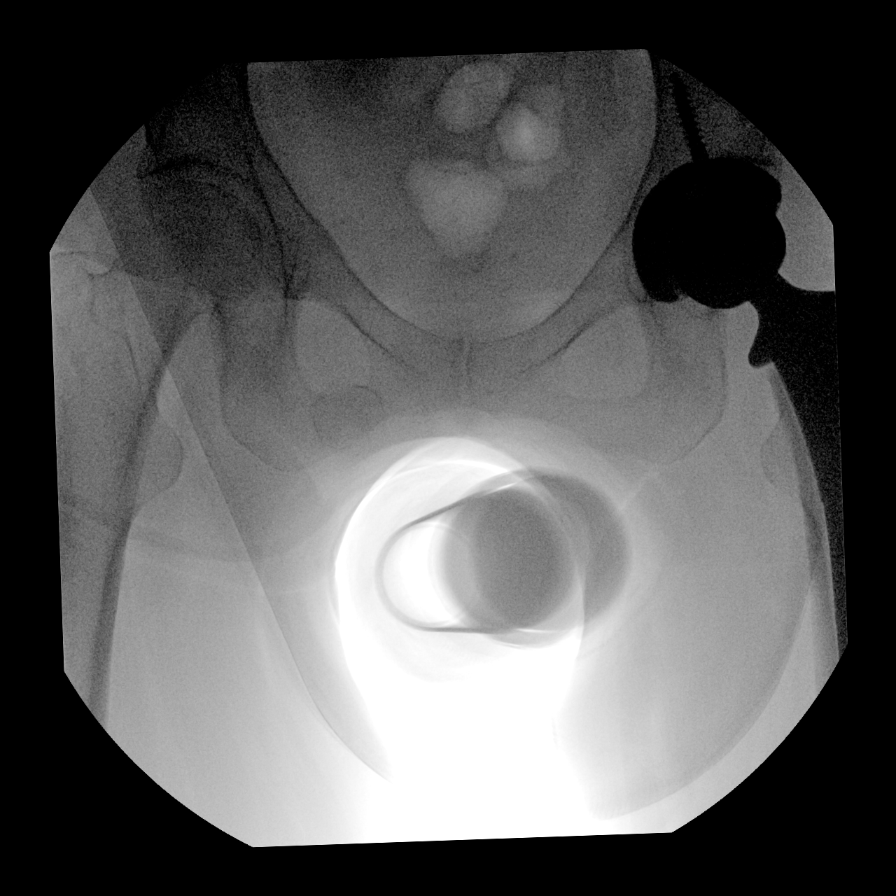

[4 of 4 positions shown; findings below may reference images not displayed]

FINDINGS: Four fluoroscopic spot views obtained in the operating room.
Interval left hip arthroplasty in expected alignment. Total
fluoroscopy time 24 seconds. Total dose 1.34 mGy.
IMPRESSION: Procedural fluoroscopy during left hip arthroplasty.

## 2021-09-04 IMAGING — RF DG C-ARM 1-60 MIN
1 series · 4 of 4 positions shown · non-contrast
Comparison: Preoperative imaging.

CLINICAL DATA: Total hip arthroplasty.

EXAM:
OPERATIVE LEFT HIP (WITH PELVIS IF PERFORMED)
TECHNIQUE: Fluoroscopic spot image(s) were submitted for interpretation
post-operatively.

[Series 1: unknown protocol · 0.20mm/px · 4 of 4 slices shown]
[im 1/4]
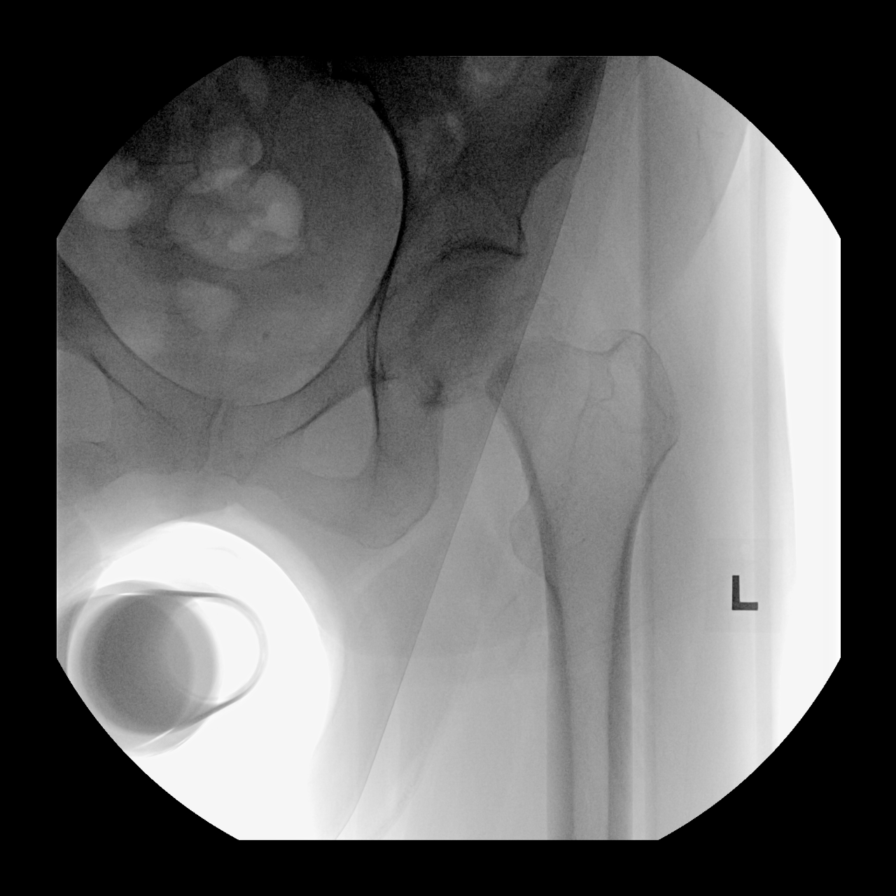
[im 2/4]
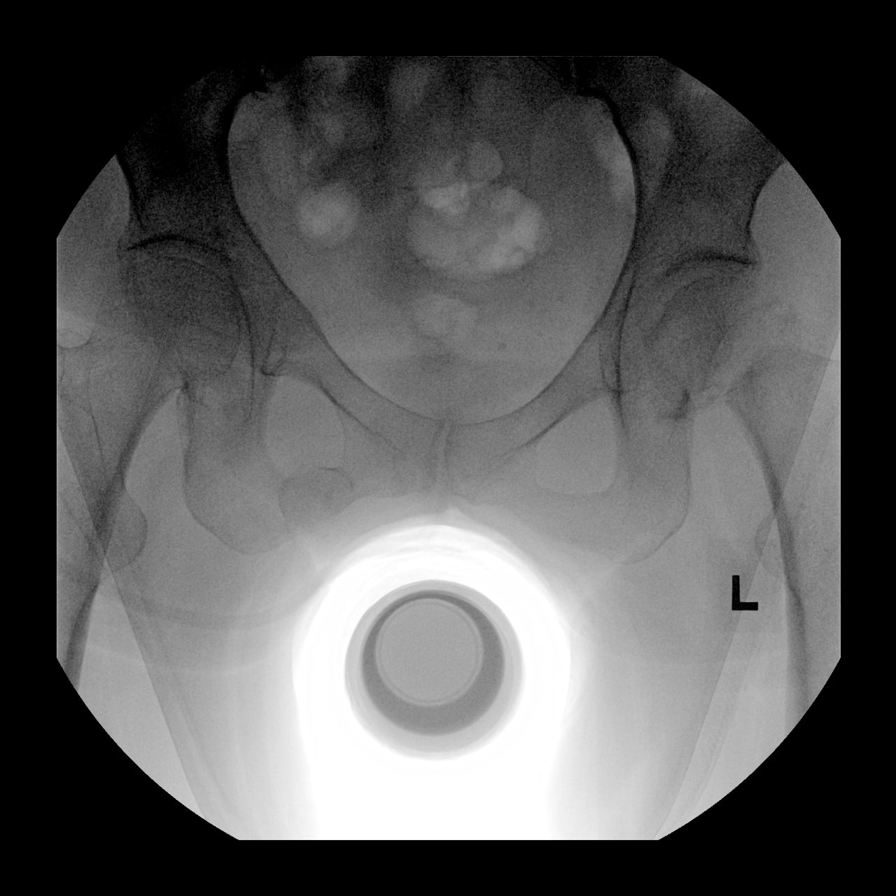
[im 3/4]
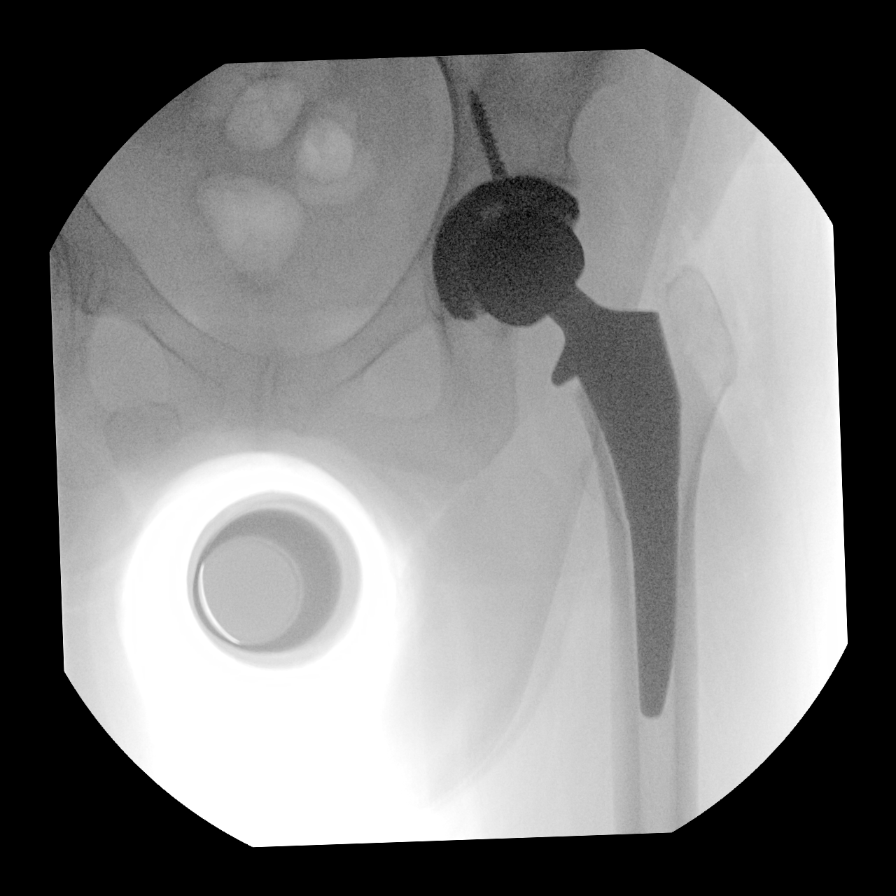
[im 4/4]
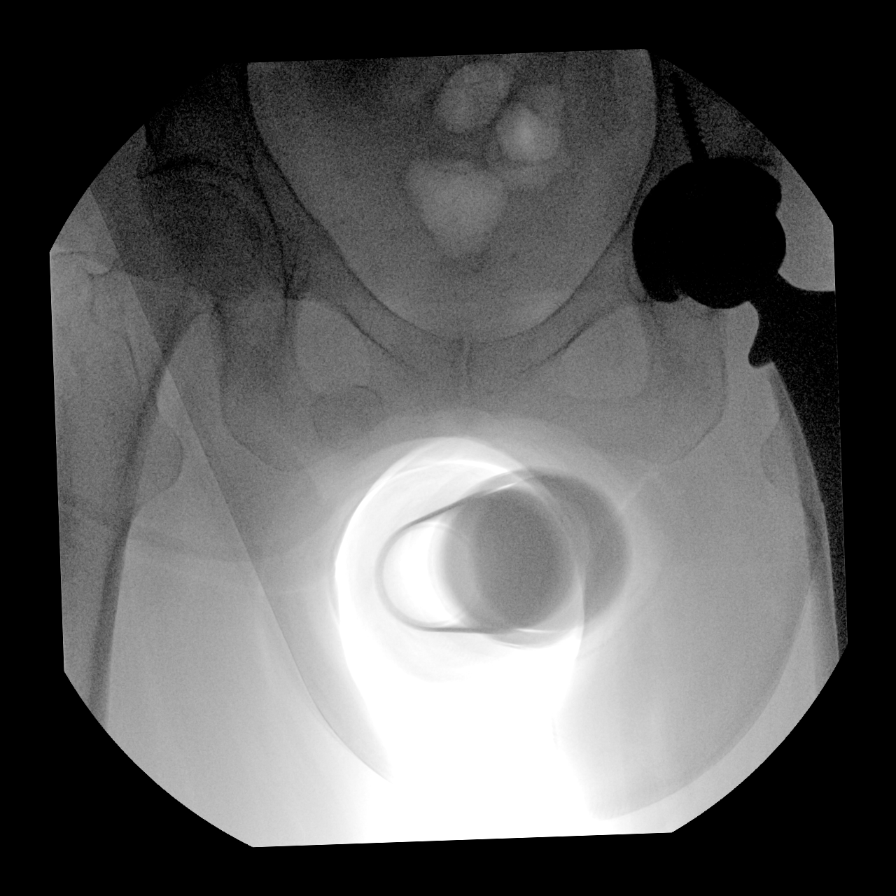

[4 of 4 positions shown; findings below may reference images not displayed]

FINDINGS: Four fluoroscopic spot views obtained in the operating room.
Interval left hip arthroplasty in expected alignment. Total
fluoroscopy time 24 seconds. Total dose 1.34 mGy.
IMPRESSION: Procedural fluoroscopy during left hip arthroplasty.

## 2022-01-12 ENCOUNTER — Ambulatory Visit: Payer: Medicaid Other

## 2023-10-18 ENCOUNTER — Ambulatory Visit: Admitting: Orthopaedic Surgery

## 2023-11-15 ENCOUNTER — Ambulatory Visit: Admitting: Orthopaedic Surgery
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
## 2024-02-29 ENCOUNTER — Other Ambulatory Visit: Payer: Self-pay

## 2024-02-29 ENCOUNTER — Encounter (HOSPITAL_COMMUNITY): Payer: Self-pay | Admitting: Family Medicine

## 2024-02-29 ENCOUNTER — Emergency Department (HOSPITAL_COMMUNITY)

## 2024-02-29 ENCOUNTER — Inpatient Hospital Stay (HOSPITAL_COMMUNITY)
Admission: EM | Admit: 2024-02-29 | Discharge: 2024-03-06 | DRG: 871 | Disposition: A | Discharge status: Home Health | Attending: Family Medicine | Admitting: Family Medicine

## 2024-02-29 DIAGNOSIS — R6520 Severe sepsis without septic shock: Principal | ICD-10-CM

## 2024-02-29 DIAGNOSIS — E785 Hyperlipidemia, unspecified: Secondary | ICD-10-CM | POA: Diagnosis present

## 2024-02-29 DIAGNOSIS — R6521 Severe sepsis with septic shock: Secondary | ICD-10-CM | POA: Diagnosis present

## 2024-02-29 DIAGNOSIS — G9341 Metabolic encephalopathy: Secondary | ICD-10-CM | POA: Diagnosis present

## 2024-02-29 DIAGNOSIS — N390 Urinary tract infection, site not specified: Secondary | ICD-10-CM | POA: Diagnosis present

## 2024-02-29 DIAGNOSIS — G894 Chronic pain syndrome: Secondary | ICD-10-CM | POA: Diagnosis present

## 2024-02-29 DIAGNOSIS — Z96642 Presence of left artificial hip joint: Secondary | ICD-10-CM | POA: Diagnosis present

## 2024-02-29 DIAGNOSIS — G9340 Encephalopathy, unspecified: Secondary | ICD-10-CM | POA: Diagnosis not present

## 2024-02-29 DIAGNOSIS — R7989 Other specified abnormal findings of blood chemistry: Secondary | ICD-10-CM | POA: Insufficient documentation

## 2024-02-29 DIAGNOSIS — M549 Dorsalgia, unspecified: Secondary | ICD-10-CM

## 2024-02-29 DIAGNOSIS — R4182 Altered mental status, unspecified: Secondary | ICD-10-CM | POA: Diagnosis present

## 2024-02-29 DIAGNOSIS — D649 Anemia, unspecified: Secondary | ICD-10-CM | POA: Diagnosis not present

## 2024-02-29 DIAGNOSIS — A4151 Sepsis due to Escherichia coli [E. coli]: Principal | ICD-10-CM | POA: Diagnosis present

## 2024-02-29 DIAGNOSIS — I10 Essential (primary) hypertension: Secondary | ICD-10-CM | POA: Diagnosis present

## 2024-02-29 DIAGNOSIS — Z79899 Other long term (current) drug therapy: Secondary | ICD-10-CM

## 2024-02-29 DIAGNOSIS — D638 Anemia in other chronic diseases classified elsewhere: Secondary | ICD-10-CM | POA: Diagnosis present

## 2024-02-29 DIAGNOSIS — E872 Acidosis, unspecified: Secondary | ICD-10-CM | POA: Diagnosis present

## 2024-02-29 DIAGNOSIS — B9620 Unspecified Escherichia coli [E. coli] as the cause of diseases classified elsewhere: Secondary | ICD-10-CM | POA: Diagnosis present

## 2024-02-29 DIAGNOSIS — Z1152 Encounter for screening for COVID-19: Secondary | ICD-10-CM | POA: Diagnosis not present

## 2024-02-29 DIAGNOSIS — N179 Acute kidney failure, unspecified: Secondary | ICD-10-CM | POA: Diagnosis present

## 2024-02-29 DIAGNOSIS — F418 Other specified anxiety disorders: Secondary | ICD-10-CM | POA: Diagnosis present

## 2024-02-29 DIAGNOSIS — A419 Sepsis, unspecified organism: Secondary | ICD-10-CM | POA: Diagnosis not present

## 2024-02-29 DIAGNOSIS — M6282 Rhabdomyolysis: Secondary | ICD-10-CM | POA: Diagnosis present

## 2024-02-29 DIAGNOSIS — E876 Hypokalemia: Secondary | ICD-10-CM | POA: Diagnosis present

## 2024-02-29 DIAGNOSIS — G8929 Other chronic pain: Secondary | ICD-10-CM | POA: Diagnosis not present

## 2024-02-29 DIAGNOSIS — F32A Depression, unspecified: Secondary | ICD-10-CM | POA: Diagnosis present

## 2024-02-29 DIAGNOSIS — Z602 Problems related to living alone: Secondary | ICD-10-CM | POA: Diagnosis present

## 2024-02-29 DIAGNOSIS — N12 Tubulo-interstitial nephritis, not specified as acute or chronic: Secondary | ICD-10-CM

## 2024-02-29 DIAGNOSIS — F17210 Nicotine dependence, cigarettes, uncomplicated: Secondary | ICD-10-CM | POA: Diagnosis present

## 2024-02-29 DIAGNOSIS — Z7983 Long term (current) use of bisphosphonates: Secondary | ICD-10-CM | POA: Diagnosis not present

## 2024-02-29 DIAGNOSIS — F419 Anxiety disorder, unspecified: Secondary | ICD-10-CM | POA: Diagnosis present

## 2024-02-29 DIAGNOSIS — E162 Hypoglycemia, unspecified: Secondary | ICD-10-CM | POA: Insufficient documentation

## 2024-02-29 LAB — COMPREHENSIVE METABOLIC PANEL WITH GFR
ALT: 51 U/L — ABNORMAL HIGH (ref 0–44)
AST: 123 U/L — ABNORMAL HIGH (ref 15–41)
Albumin: 3.2 g/dL — ABNORMAL LOW (ref 3.5–5.0)
Alkaline Phosphatase: 78 U/L (ref 38–126)
Anion gap: 19 — ABNORMAL HIGH (ref 5–15)
BUN: 62 mg/dL — ABNORMAL HIGH (ref 6–20)
CO2: 12 mmol/L — ABNORMAL LOW (ref 22–32)
Calcium: 7.4 mg/dL — ABNORMAL LOW (ref 8.9–10.3)
Chloride: 112 mmol/L — ABNORMAL HIGH (ref 98–111)
Creatinine, Ser: 2.07 mg/dL — ABNORMAL HIGH (ref 0.44–1.00)
GFR, Estimated: 27 mL/min — ABNORMAL LOW (ref >60)
Glucose, Bld: 55 mg/dL — ABNORMAL LOW (ref 70–99)
Potassium: 3.1 mmol/L — ABNORMAL LOW (ref 3.5–5.1)
Sodium: 143 mmol/L (ref 135–145)
Total Bilirubin: 1.7 mg/dL — ABNORMAL HIGH (ref 0.0–1.2)
Total Protein: 5.7 g/dL — ABNORMAL LOW (ref 6.5–8.1)

## 2024-02-29 LAB — RAPID URINE DRUG SCREEN, HOSP PERFORMED
Amphetamines: NOT DETECTED
Barbiturates: NOT DETECTED
Benzodiazepines: NOT DETECTED
Cocaine: NOT DETECTED
Opiates: POSITIVE — AB
Tetrahydrocannabinol: NOT DETECTED

## 2024-02-29 LAB — CK: Total CK: 6324 U/L — ABNORMAL HIGH (ref 38–234)

## 2024-02-29 LAB — PROTIME-INR
INR: 1.3 — ABNORMAL HIGH (ref 0.8–1.2)
Prothrombin Time: 16.7 s — ABNORMAL HIGH (ref 11.4–15.2)

## 2024-02-29 LAB — URINALYSIS, W/ REFLEX TO CULTURE (INFECTION SUSPECTED)
Bilirubin Urine: NEGATIVE
Glucose, UA: NEGATIVE mg/dL
Ketones, ur: 5 mg/dL — AB
Nitrite: POSITIVE — AB
Protein, ur: 30 mg/dL — AB
RBC / HPF: >50 RBC/hpf (ref 0–5)
Specific Gravity, Urine: 1.014 (ref 1.005–1.030)
WBC, UA: >50 WBC/hpf (ref 0–5)
pH: 5 (ref 5.0–8.0)

## 2024-02-29 LAB — CBC WITH DIFFERENTIAL/PLATELET
Abs Immature Granulocytes: 0.12 K/uL — ABNORMAL HIGH (ref 0.00–0.07)
Basophils Absolute: 0.1 K/uL (ref 0.0–0.1)
Basophils Relative: 0 %
Eosinophils Absolute: 0 K/uL (ref 0.0–0.5)
Eosinophils Relative: 0 %
HCT: 24 % — ABNORMAL LOW (ref 36.0–46.0)
Hemoglobin: 7.6 g/dL — ABNORMAL LOW (ref 12.0–15.0)
Immature Granulocytes: 1 %
Lymphocytes Relative: 11 %
Lymphs Abs: 2.2 K/uL (ref 0.7–4.0)
MCH: 28.5 pg (ref 26.0–34.0)
MCHC: 31.7 g/dL (ref 30.0–36.0)
MCV: 89.9 fL (ref 80.0–100.0)
Monocytes Absolute: 1.7 K/uL — ABNORMAL HIGH (ref 0.1–1.0)
Monocytes Relative: 8 %
Neutro Abs: 15.6 K/uL — ABNORMAL HIGH (ref 1.7–7.7)
Neutrophils Relative %: 80 %
Platelets: 432 K/uL — ABNORMAL HIGH (ref 150–400)
RBC: 2.67 MIL/uL — ABNORMAL LOW (ref 3.87–5.11)
RDW: 16 % — ABNORMAL HIGH (ref 11.5–15.5)
WBC: 19.7 K/uL — ABNORMAL HIGH (ref 4.0–10.5)
nRBC: 0 % (ref 0.0–0.2)

## 2024-02-29 LAB — ETHANOL: Alcohol, Ethyl (B): <15 mg/dL (ref <15)

## 2024-02-29 LAB — LACTIC ACID, PLASMA
Lactic Acid, Venous: 0.7 mmol/L (ref 0.5–1.9)
Lactic Acid, Venous: 0.8 mmol/L (ref 0.5–1.9)

## 2024-02-29 LAB — CBG MONITORING, ED: Glucose-Capillary: 117 mg/dL — ABNORMAL HIGH (ref 70–99)

## 2024-02-29 LAB — RESP PANEL BY RT-PCR (RSV, FLU A&B, COVID)  RVPGX2
Influenza A by PCR: NEGATIVE
Influenza B by PCR: NEGATIVE
Resp Syncytial Virus by PCR: NEGATIVE
SARS Coronavirus 2 by RT PCR: NEGATIVE

## 2024-02-29 MED ORDER — SODIUM CHLORIDE 0.9 % IV SOLN
2.0000 g | Freq: Once | INTRAVENOUS | Status: AC
  Administered 2024-02-29 (×2): 2 g via INTRAVENOUS
  Filled 2024-02-29: qty 20

## 2024-02-29 MED ORDER — ALBUMIN HUMAN 25 % IV SOLN
25.0000 g | Freq: Once | INTRAVENOUS | Status: AC
  Administered 2024-02-29 (×2): 25 g via INTRAVENOUS
  Filled 2024-02-29: qty 100

## 2024-02-29 MED ORDER — SODIUM CHLORIDE 0.9% FLUSH
3.0000 mL | Freq: Two times a day (BID) | INTRAVENOUS | Status: DC
  Administered 2024-02-29 – 2024-03-06 (×13): 3 mL via INTRAVENOUS

## 2024-02-29 MED ORDER — LACTATED RINGERS IV BOLUS (SEPSIS)
1000.0000 mL | Freq: Once | INTRAVENOUS | Status: AC
  Administered 2024-02-29 (×2): 1000 mL via INTRAVENOUS

## 2024-02-29 MED ORDER — STERILE WATER FOR INJECTION IV SOLN
INTRAVENOUS | Status: DC
  Filled 2024-02-29 (×3): qty 1000

## 2024-02-29 MED ORDER — CHLORHEXIDINE GLUCONATE CLOTH 2 % EX PADS
6.0000 | MEDICATED_PAD | Freq: Every day | CUTANEOUS | Status: DC
  Administered 2024-02-29 – 2024-03-01 (×3): 6 via TOPICAL

## 2024-02-29 MED ORDER — OXYCODONE HCL 5 MG PO TABS
5.0000 mg | ORAL_TABLET | ORAL | Status: DC | PRN
  Administered 2024-03-01: 10 mg via ORAL
  Administered 2024-03-01: 5 mg via ORAL
  Administered 2024-03-01: 10 mg via ORAL
  Administered 2024-03-02 (×2): 5 mg via ORAL
  Administered 2024-03-02 (×2): 10 mg via ORAL
  Administered 2024-03-02: 5 mg via ORAL
  Administered 2024-03-03 (×2): 10 mg via ORAL
  Filled 2024-02-29 (×3): qty 2
  Filled 2024-02-29 (×3): qty 1
  Filled 2024-02-29: qty 2
  Filled 2024-02-29: qty 1
  Filled 2024-02-29 (×2): qty 2

## 2024-02-29 MED ORDER — ACETAMINOPHEN 650 MG RE SUPP: 650.0000 mg | Freq: Four times a day (QID) | RECTAL | Status: DC | PRN

## 2024-02-29 MED ORDER — METHOCARBAMOL 500 MG PO TABS
500.0000 mg | ORAL_TABLET | Freq: Four times a day (QID) | ORAL | Status: DC | PRN
  Administered 2024-03-04 – 2024-03-06 (×6): 500 mg via ORAL
  Filled 2024-02-29 (×6): qty 1

## 2024-02-29 MED ORDER — ONDANSETRON HCL 4 MG PO TABS
4.0000 mg | ORAL_TABLET | Freq: Four times a day (QID) | ORAL | Status: AC | PRN
Start: 2024-02-29 — End: ?

## 2024-02-29 MED ORDER — CALCIUM GLUCONATE-NACL 1-0.675 GM/50ML-% IV SOLN
1.0000 g | Freq: Once | INTRAVENOUS | Status: AC
  Administered 2024-02-29 (×2): 1000 mg via INTRAVENOUS
  Filled 2024-02-29: qty 50

## 2024-02-29 MED ORDER — GABAPENTIN 300 MG PO CAPS
300.0000 mg | ORAL_CAPSULE | Freq: Two times a day (BID) | ORAL | Status: DC
  Administered 2024-02-29 – 2024-03-03 (×7): 300 mg via ORAL
  Filled 2024-02-29 (×6): qty 1

## 2024-02-29 MED ORDER — LACTATED RINGERS IV SOLN
INTRAVENOUS | Status: DC
  Administered 2024-02-29 (×2): 1000 mL via INTRAVENOUS

## 2024-02-29 MED ORDER — ONDANSETRON HCL 4 MG/2ML IJ SOLN: 4.0000 mg | Freq: Four times a day (QID) | INTRAMUSCULAR | Status: DC | PRN

## 2024-02-29 MED ORDER — ACETAMINOPHEN 325 MG PO TABS
650.0000 mg | ORAL_TABLET | Freq: Four times a day (QID) | ORAL | Status: DC | PRN
  Administered 2024-03-02 – 2024-03-06 (×6): 650 mg via ORAL
  Filled 2024-02-29 (×6): qty 2

## 2024-02-29 MED ORDER — DEXTROSE 50 % IV SOLN
1.0000 | Freq: Once | INTRAVENOUS | Status: AC
  Administered 2024-02-29 (×2): 50 mL via INTRAVENOUS
  Filled 2024-02-29: qty 50

## 2024-02-29 MED ORDER — LAMOTRIGINE 100 MG PO TABS
100.0000 mg | ORAL_TABLET | Freq: Every day | ORAL | Status: DC
  Administered 2024-02-29 – 2024-03-05 (×7): 100 mg via ORAL
  Filled 2024-02-29 (×6): qty 1

## 2024-02-29 MED ORDER — POTASSIUM CHLORIDE 20 MEQ PO PACK
20.0000 meq | PACK | Freq: Once | ORAL | Status: AC
  Administered 2024-02-29 (×2): 20 meq via ORAL
  Filled 2024-02-29: qty 1

## 2024-02-29 MED ORDER — SODIUM CHLORIDE 0.9 % IV SOLN
2.0000 g | INTRAVENOUS | Status: DC
  Administered 2024-03-01 – 2024-03-03 (×3): 2 g via INTRAVENOUS
  Filled 2024-02-29 (×3): qty 20

## 2024-02-29 MED ORDER — CLONAZEPAM 0.5 MG PO TABS
1.0000 mg | ORAL_TABLET | Freq: Two times a day (BID) | ORAL | Status: DC | PRN
  Administered 2024-03-01 – 2024-03-06 (×9): 1 mg via ORAL
  Filled 2024-02-29 (×10): qty 2

## 2024-02-29 MED ORDER — LIDOCAINE-EPINEPHRINE (PF) 2 %-1:200000 IJ SOLN: 10.0000 mL | Freq: Once | INTRAMUSCULAR | Status: DC

## 2024-02-29 MED ORDER — SODIUM CHLORIDE 0.9 % IV BOLUS
1000.0000 mL | Freq: Once | INTRAVENOUS | Status: AC
  Administered 2024-02-29 (×2): 1000 mL via INTRAVENOUS

## 2024-02-29 NOTE — Sepsis Progress Note (Signed)
 Sepsis protocol monitored by eLink
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
## 2024-02-29 NOTE — ED Triage Notes (Signed)
 Pt verbalized she is fidgety d/t not having her Seroquel for 2 days.

## 2024-02-29 NOTE — ED Notes (Signed)
 Seizure pas applied to bed for safety.

## 2024-02-29 NOTE — H&P (Addendum)
 History and Physical    Shiri Hodapp FMW:[id number] DOB: [birth year] DOA: 02/29/2024  PCP: System, Provider Not In   Patient coming from: Home   Chief Complaint: AMS  HPI: Floetta Brickey is a 58 y.o. female with medical history significant for hypertension, hyperlipidemia, chronic pain, depression, and anxiety who is brought in with altered mental status after being locked out of her house and remaining outside for the past 2 days.  Patient cannot be reached by phone and EMS was asked to do a welfare check.  They found the patient outside of her house, soaking wet, apparently locked out.  She is confused and had systolic blood pressure in the 80s.  She is not providing much meaningful history in the ED.  ED Course: Upon arrival to the ED, patient is found to be afebrile and saturating well on room air with tachypnea, mild tachycardia, and hypotension.  Labs are most notable for bicarbonate 12, BUN 62, creatinine 2.07, glucose 55, calcium  7.4, AST 123, ALT 51, WBC 19,700, hemoglobin 7.6, normal lactic acid, and urine with bacteriuria, pyuria, and positive nitrites.  No acute findings are seen on head CT or chest x-ray.  Blood and urine cultures were collected and the patient was given 3 L LR, IV dextrose , and 2 g IV Rocephin .  Review of Systems:  ROS limited by patient's clinical condition.  Past Medical History:  Diagnosis Date   Chronic back pain 07/20/2018   Dyslipidemia 07/20/2018   Hypertension 07/20/2018    Past Surgical History:  Procedure Laterality Date   TOTAL HIP ARTHROPLASTY Left 10/15/2020   Procedure: TOTAL HIP ARTHROPLASTY ANTERIOR APPROACH;  Surgeon: Jerri Kay HERO, MD;  Location: [facility] OR;  Service: Orthopedics;  Laterality: Left;    Social History:   reports that she has been smoking cigarettes. She has never used smokeless tobacco. No history on file for alcohol  use and drug use.  No Known Allergies  History reviewed. No pertinent family history.   Prior to Admission  medications   Medication Sig Start Date End Date Taking? Authorizing Provider  alendronate (FOSAMAX) 70 MG tablet Take 70 mg by mouth once a week.    [provider]  clonazePAM  (KLONOPIN ) 1 MG tablet Take 1 mg by mouth 2 (two) times daily as needed.    [provider]  cyclobenzaprine (FLEXERIL) 10 MG tablet Take 10 mg by mouth 3 (three) times daily as needed for muscle spasms. 07/17/20   [provider]  diclofenac  (VOLTAREN ) 75 MG EC tablet Take 1 tablet (75 mg total) by mouth 2 (two) times daily. 12/10/20   Jerri Kay HERO, MD  docusate sodium  (COLACE) 100 MG capsule Take 1 capsule (100 mg total) by mouth 2 (two) times daily. 10/19/20   Nitka, James E, MD  DULoxetine  (CYMBALTA ) 60 MG capsule Take 60 mg by mouth daily.    [provider]  fenofibrate  (TRICOR ) 145 MG tablet Take 145 mg by mouth daily.    [provider]  gabapentin  (NEURONTIN ) 800 MG tablet Take 800 mg by mouth 3 (three) times daily.    [provider]  ibuprofen (ADVIL,MOTRIN) 800 MG tablet Take 200 mg by mouth every 8 (eight) hours as needed for mild pain, headache or fever.    [provider]  lamoTRIgine  (LAMICTAL ) 100 MG tablet Take 100 mg by mouth at bedtime. 06/30/22   [provider]  lisinopril -hydrochlorothiazide  (ZESTORETIC ) 10-12.5 MG tablet Take 1 tablet by mouth daily.    [provider]  methocarbamol  (  ROBAXIN ) 500 MG tablet Take 1 tablet (500 mg total) by mouth every 6 (six) hours as needed for muscle spasms. 12/10/20   Jerri Kay HERO, MD  naloxone [facility]) nasal spray 4 mg/0.1 mL Place 0.4 mg into the nose once. 09/16/20   [provider]  oxyCODONE  (ROXICODONE ) 15 MG immediate release tablet Take 15 mg by mouth 4 (four) times daily as needed.    [provider]  predniSONE  (STERAPRED UNI-PAK 21 TAB) 10 MG (21) TBPK tablet Take as directed 12/10/20   Jerri Kay HERO, MD  Vilazodone HCl 20 MG TABS Take 1 tablet by mouth every  morning.    [provider]    Physical Exam: Vitals:   02/29/24 1954 02/29/24 1958 02/29/24 2001 02/29/24 2015  BP: (!) 126/97     Pulse:  99 (!) 101 98  Resp: (!) 24 20 (!) 22 (!) 27  Temp:      TempSrc:      SpO2:  90% 92% 97%  Weight:      Height:        Constitutional: NAD, no diaphoresis   Eyes: PERTLA, lids and conjunctivae normal ENMT: Mucous membranes are dry. Posterior pharynx clear of any exudate or lesions.   Neck: supple, no masses  Respiratory: no wheezing, no crackles. No accessory muscle use.  Cardiovascular: S1 & S2 heard, regular rate and rhythm. No extremity edema.   Abdomen: No tenderness, soft. Bowel sounds active.  Musculoskeletal: no clubbing / cyanosis. No joint deformity upper and lower extremities.   Skin: no significant rashes, lesions, ulcers. Warm, dry, well-perfused. Neurologic: CN 2-12 grossly intact. Moving all extremities. Alert and oriented to person.  Psychiatric: Restless. Cooperative.    Labs and Imaging on Admission: I have personally reviewed following labs and imaging studies  CBC: Recent Labs  Lab 02/29/24 1649  WBC 19.7*  NEUTROABS 15.6*  HGB 7.6*  HCT 24.0*  MCV 89.9  PLT 432*   Basic Metabolic Panel: Recent Labs  Lab 02/29/24 1649  NA 143  K 3.1*  CL 112*  CO2 12*  GLUCOSE 55*  BUN 62*  CREATININE 2.07*  CALCIUM  7.4*   GFR: Estimated Creatinine Clearance: 25.8 mL/min (A) (by C-G formula based on SCr of 2.07 mg/dL (H)). Liver Function Tests: Recent Labs  Lab 02/29/24 1649  AST 123*  ALT 51*  ALKPHOS 78  BILITOT 1.7*  PROT 5.7*  ALBUMIN  3.2*   No results for input(s): LIPASE, AMYLASE in the last 168 hours. No results for input(s): AMMONIA in the last 168 hours. Coagulation Profile: Recent Labs  Lab 02/29/24 1649  INR 1.3*   Cardiac Enzymes: Recent Labs  Lab 02/29/24 1658  CKTOTAL 6,324*   BNP (last 3 results) No results for input(s): PROBNP in the last 8760 hours. HbA1C: No  results for input(s): HGBA1C in the last 72 hours. CBG: Recent Labs  Lab 02/29/24 2020  GLUCAP 117*   Lipid Profile: No results for input(s): CHOL, HDL, LDLCALC, TRIG, CHOLHDL, LDLDIRECT in the last 72 hours. Thyroid Function Tests: No results for input(s): TSH, T4TOTAL, FREET4, T3FREE, THYROIDAB in the last 72 hours. Anemia Panel: No results for input(s): VITAMINB12, FOLATE, FERRITIN, TIBC, IRON , RETICCTPCT in the last 72 hours. Urine analysis:    Component Value Date/Time   COLORURINE YELLOW 02/29/2024 1715   APPEARANCEUR CLOUDY (A) 02/29/2024 1715   LABSPEC 1.014 02/29/2024 1715   PHURINE 5.0 02/29/2024 1715   GLUCOSEU NEGATIVE 02/29/2024 1715   HGBUR MODERATE (A) 02/29/2024 1715  BILIRUBINUR NEGATIVE 02/29/2024 1715   KETONESUR 5 (A) 02/29/2024 1715   PROTEINUR 30 (A) 02/29/2024 1715   NITRITE POSITIVE (A) 02/29/2024 1715   LEUKOCYTESUR LARGE (A) 02/29/2024 1715   Sepsis Labs: @LABRCNTIP (procalcitonin:4,lacticidven:4) ) Recent Results (from the past 240 hours)  Blood Culture (routine x 2)     Status: None (Preliminary result)   Collection Time: 02/29/24  4:52 PM   Specimen: Vein; Blood  Result Value Ref Range Status   Specimen Description BLOOD LEFT ANTECUBITAL  Final   Special Requests   Final    BOTTLES DRAWN AEROBIC ONLY Blood Culture results may not be optimal due to an inadequate volume of blood received in culture bottles Performed at [facility], [address] [place], [place] [address]    Culture PENDING  Incomplete   Report Status PENDING  Incomplete  Blood Culture (routine x 2)     Status: None (Preliminary result)   Collection Time: 02/29/24  4:58 PM   Specimen: Vein; Blood  Result Value Ref Range Status   Specimen Description BLOOD BLOOD RIGHT HAND  Final   Special Requests   Final    BOTTLES DRAWN AEROBIC ONLY Blood Culture results may not be optimal due to an inadequate volume of blood received in culture  bottles Performed at [facility], [address], [place], [place] [address]    Culture PENDING  Incomplete   Report Status PENDING  Incomplete  Resp panel by RT-PCR (RSV, Flu A&B, Covid) Anterior Nasal Swab     Status: None   Collection Time: 02/29/24  5:04 PM   Specimen: Anterior Nasal Swab  Result Value Ref Range Status   SARS Coronavirus 2 by RT PCR NEGATIVE NEGATIVE Final    Comment: (NOTE) SARS-CoV-2 target nucleic acids are NOT DETECTED.  The SARS-CoV-2 RNA is generally detectable in upper respiratory specimens during the acute phase of infection. The lowest concentration of SARS-CoV-2 viral copies this assay can detect is 138 copies/mL. A negative result does not preclude SARS-Cov-2 infection and should not be used as the sole basis for treatment or other patient management decisions. A negative result may occur with  improper specimen collection/handling, submission of specimen other than nasopharyngeal swab, presence of viral mutation(s) within the areas targeted by this assay, and inadequate number of viral copies(<138 copies/mL). A negative result must be combined with clinical observations, patient history, and epidemiological information. The expected result is Negative.  Fact Sheet for Patients:  BloggerCourse.com  Fact Sheet for Healthcare Providers:  SeriousBroker.it  This test is no t yet approved or cleared by the [place]  FDA and  has been authorized for detection and/or diagnosis of SARS-CoV-2 by FDA under an Emergency Use Authorization (EUA). This EUA will remain  in effect (meaning this test can be used) for the duration of the COVID-19 declaration under Section 564(b)(1) of the Act, 21 U.S.C.section 360bbb-3(b)(1), unless the authorization is terminated  or revoked sooner.       Influenza A by PCR NEGATIVE NEGATIVE Final   Influenza B by PCR NEGATIVE NEGATIVE Final    Comment: (NOTE) The Xpert  Xpress SARS-CoV-2/FLU/RSV plus assay is intended as an aid in the diagnosis of influenza from Nasopharyngeal swab specimens and should not be used as a sole basis for treatment. Nasal washings and aspirates are unacceptable for Xpert Xpress SARS-CoV-2/FLU/RSV testing.  Fact Sheet for Patients: BloggerCourse.com  Fact Sheet for Healthcare Providers: SeriousBroker.it  This test is not yet approved or cleared by the [place]  FDA and  has been authorized for detection and/or diagnosis of SARS-CoV-2 by FDA under an Emergency Use Authorization (EUA). This EUA will remain in effect (meaning this test can be used) for the duration of the COVID-19 declaration under Section 564(b)(1) of the Act, 21 U.S.C. section 360bbb-3(b)(1), unless the authorization is terminated or revoked.     Resp Syncytial Virus by PCR NEGATIVE NEGATIVE Final    Comment: (NOTE) Fact Sheet for Patients: BloggerCourse.com  Fact Sheet for Healthcare Providers: SeriousBroker.it  This test is not yet approved or cleared by the [place]  FDA and has been authorized for detection and/or diagnosis of SARS-CoV-2 by FDA under an Emergency Use Authorization (EUA). This EUA will remain in effect (meaning this test can be used) for the duration of the COVID-19 declaration under Section 564(b)(1) of the Act, 21 U.S.C. section 360bbb-3(b)(1), unless the authorization is terminated or revoked.  Performed at [facility], [address] [place], [place] [address]      Radiological Exams on Admission: CT Head Wo Contrast Result Date: 02/29/2024 EXAM: CT HEAD WITHOUT CONTRAST 02/29/2024 06:15:40 PM TECHNIQUE: CT of the head was performed without the administration of intravenous contrast. Automated exposure control, iterative reconstruction, and/or weight based adjustment of the mA/kV was utilized to reduce the radiation  dose to as low as reasonably achievable. COMPARISON: CT of the head dated 07/20/2000. The study is degraded by patient motion. CLINICAL HISTORY: Altered mental status, nontraumatic (Ped 0-17y). Pt home health nurse found pt on porch with pants to ankles. Pt door was locked an seemed as if she may have been outside for 2 days. Pt is dirty, altered, and figety. FINDINGS: BRAIN AND VENTRICLES: No acute hemorrhage. Gray-white differentiation is preserved. No hydrocephalus. No extra-axial collection. No mass effect or midline shift. ORBITS: No acute abnormality. SINUSES: No acute abnormality. SOFT TISSUES AND SKULL: No acute soft tissue abnormality. No skull fracture. IMPRESSION: 1. No acute intracranial abnormality. 2. Study degraded by patient motion. Electronically signed by: evalene coho 02/29/2024 08:02 PM EDT RP Workstation: [id number]   DG Chest Port 1 View Result Date: 02/29/2024 CLINICAL DATA:  Questionable sepsis - evaluate for abnormality EXAM: PORTABLE CHEST - 1 VIEW COMPARISON:  December 21, 2023 FINDINGS: Lower lung volumes. No focal airspace consolidation, pleural effusion, or pneumothorax. No cardiomegaly. Tortuous aorta. No acute fracture or destructive lesions. Multilevel thoracic osteophytosis. Cervical fusion hardware, partially visualized. IMPRESSION: No acute cardiopulmonary abnormality. Electronically Signed   By: Rogelia Myers M.D.   On: 02/29/2024 19:27    EKG: Independently reviewed. Sinus tachycardia.   Assessment/Plan   1. Sepsis d/t UTI  - Continue Rocephin , follow cultures and clinical course    Addendum (00:30): MAP is under 65 despite more than 3 liters IVF (body weight is only 51 kg) and albumin . Plan to repeat labs and start Levophed .    2. Rhabdomyolysis; AKI; metabolic acidosis  - Hold lisinopril -hydrochlorothiazide , continue IVF hydration with isotonic bicarbonate, renally-dose medications, and repeat chem panel in am   3. Acute encephalopathy  - No acute findings  on head CT, likely d/t sepsis and electrolyte derangements  - Use delirium precautions, treat sepsis and electrolyte derangements   4. Hypertension  - Hypotensive initially and antihypertensives held on admission   5. Anemia  - Hgb 7.6, down from 11.9 in June 2025  - No overt bleeding  - Check anemia panel, trend CBCs   6. Chronic pain  - Prescription database reviewed  - Continue home medications with adjustments for decreased GFR   7.  Hypokalemia; hypocalcemia  - Replacing    8. Elevated LFTs  - Trend LFTs while treating rhabdomyolysis, AKI, and sepsis    DVT prophylaxis: SCDs  Code Status: Full  Level of Care: Level of care: Stepdown Family Communication: none present  Disposition Plan:  Patient is from: Home  Anticipated d/c is to: TBD Anticipated d/c date is: 03/03/24  Patient currently: Pending improved renal function, stable H&H, treatment of UTI, disposition planning Consults called: None  Admission status: Inpatient     Evalene GORMAN Sprinkles, MD [facility]  02/29/2024, 8:34 PM

## 2024-03-01 DIAGNOSIS — A419 Sepsis, unspecified organism: Secondary | ICD-10-CM | POA: Diagnosis not present

## 2024-03-01 DIAGNOSIS — N390 Urinary tract infection, site not specified: Secondary | ICD-10-CM | POA: Diagnosis not present

## 2024-03-01 LAB — BLOOD CULTURE ID PANEL (REFLEXED) - BCID2

## 2024-03-01 LAB — COMPREHENSIVE METABOLIC PANEL WITH GFR
ALT: 45 U/L — ABNORMAL HIGH (ref 0–44)
AST: 82 U/L — ABNORMAL HIGH (ref 15–41)
Albumin: 3.2 g/dL — ABNORMAL LOW (ref 3.5–5.0)
Alkaline Phosphatase: 71 U/L (ref 38–126)
Anion gap: 15 (ref 5–15)
BUN: 42 mg/dL — ABNORMAL HIGH (ref 6–20)
CO2: 19 mmol/L — ABNORMAL LOW (ref 22–32)
Calcium: 8.1 mg/dL — ABNORMAL LOW (ref 8.9–10.3)
Chloride: 111 mmol/L (ref 98–111)
Creatinine, Ser: 1.07 mg/dL — ABNORMAL HIGH (ref 0.44–1.00)
GFR, Estimated: >60 mL/min (ref >60)
Glucose, Bld: 88 mg/dL (ref 70–99)
Potassium: 2.9 mmol/L — ABNORMAL LOW (ref 3.5–5.1)
Sodium: 145 mmol/L (ref 135–145)
Total Bilirubin: 1.7 mg/dL — ABNORMAL HIGH (ref 0.0–1.2)
Total Protein: 5.7 g/dL — ABNORMAL LOW (ref 6.5–8.1)

## 2024-03-01 LAB — RETICULOCYTES
Immature Retic Fract: 17.9 % — ABNORMAL HIGH (ref 2.3–15.9)
RBC.: 2.94 MIL/uL — ABNORMAL LOW (ref 3.87–5.11)
Retic Count, Absolute: 27.6 K/uL (ref 19.0–186.0)
Retic Ct Pct: 0.9 % (ref 0.4–3.1)

## 2024-03-01 LAB — CBC
HCT: 25.9 % — ABNORMAL LOW (ref 36.0–46.0)
Hemoglobin: 8.4 g/dL — ABNORMAL LOW (ref 12.0–15.0)
MCH: 28.6 pg (ref 26.0–34.0)
MCHC: 32.4 g/dL (ref 30.0–36.0)
MCV: 88.1 fL (ref 80.0–100.0)
Platelets: 439 K/uL — ABNORMAL HIGH (ref 150–400)
RBC: 2.94 MIL/uL — ABNORMAL LOW (ref 3.87–5.11)
RDW: 16.2 % — ABNORMAL HIGH (ref 11.5–15.5)
WBC: 18.9 K/uL — ABNORMAL HIGH (ref 4.0–10.5)
nRBC: 0 % (ref 0.0–0.2)

## 2024-03-01 LAB — IRON AND TIBC
Iron: 89 ug/dL (ref 28–170)
Saturation Ratios: 39 % — ABNORMAL HIGH (ref 10.4–31.8)
TIBC: 230 ug/dL — ABNORMAL LOW (ref 250–450)
UIBC: 141 ug/dL

## 2024-03-01 LAB — HIV ANTIBODY (ROUTINE TESTING W REFLEX): HIV Screen 4th Generation wRfx: NONREACTIVE

## 2024-03-01 LAB — FOLATE: Folate: 28.6 ng/mL (ref >5.9)

## 2024-03-01 LAB — MRSA NEXT GEN BY PCR, NASAL: MRSA by PCR Next Gen: NOT DETECTED

## 2024-03-01 LAB — MAGNESIUM: Magnesium: 2.1 mg/dL (ref 1.7–2.4)

## 2024-03-01 LAB — FERRITIN: Ferritin: 293 ng/mL (ref 11–307)

## 2024-03-01 LAB — VITAMIN B12: Vitamin B-12: 223 pg/mL (ref 180–914)

## 2024-03-01 MED ORDER — VANCOMYCIN HCL 750 MG/150ML IV SOLN: 750.0000 mg | INTRAVENOUS | Status: DC

## 2024-03-01 MED ORDER — NOREPINEPHRINE 4 MG/250ML-% IV SOLN
0.0000 ug/min | INTRAVENOUS | Status: DC
  Administered 2024-03-01: 2 ug/min via INTRAVENOUS
  Filled 2024-03-01: qty 250

## 2024-03-01 MED ORDER — SODIUM CHLORIDE 0.9 % IV SOLN: 250.0000 mL | INTRAVENOUS | Status: AC

## 2024-03-01 MED ORDER — POLYVINYL ALCOHOL 1.4 % OP SOLN
1.0000 [drp] | OPHTHALMIC | Status: DC | PRN
  Administered 2024-03-02: 1 [drp] via OPHTHALMIC
  Filled 2024-03-01: qty 15

## 2024-03-01 MED ORDER — POTASSIUM CHLORIDE 10 MEQ/100ML IV SOLN
10.0000 meq | Freq: Once | INTRAVENOUS | Status: AC
  Administered 2024-03-01: 10 meq via INTRAVENOUS
  Filled 2024-03-01: qty 100

## 2024-03-01 MED ORDER — SODIUM CHLORIDE 0.9 % IV SOLN: INTRAVENOUS | Status: DC

## 2024-03-01 MED ORDER — VANCOMYCIN HCL IN DEXTROSE 1-5 GM/200ML-% IV SOLN
1000.0000 mg | Freq: Once | INTRAVENOUS | Status: AC
  Administered 2024-03-01: 1000 mg via INTRAVENOUS
  Filled 2024-03-01: qty 200

## 2024-03-01 MED ORDER — POTASSIUM CHLORIDE 20 MEQ PO PACK
40.0000 meq | PACK | Freq: Once | ORAL | Status: DC
  Filled 2024-03-01: qty 2

## 2024-03-01 MED ORDER — VITAMIN B-12 1000 MCG PO TABS
1000.0000 ug | ORAL_TABLET | Freq: Every day | ORAL | Status: DC
  Administered 2024-03-01 – 2024-03-06 (×6): 1000 ug via ORAL
  Filled 2024-03-01 (×6): qty 1

## 2024-03-01 MED ORDER — POTASSIUM CHLORIDE CRYS ER 20 MEQ PO TBCR
40.0000 meq | EXTENDED_RELEASE_TABLET | ORAL | Status: AC
  Administered 2024-03-01 (×2): 40 meq via ORAL
  Filled 2024-03-01 (×2): qty 2

## 2024-03-01 NOTE — Consult Note (Signed)
 Pharmacy Antibiotic Note  Allison Berry is a 58 y.o. female with medical history including hypertension, hyperlipidemia, chronic pain, depression, and anxiety admitted on 02/29/2024 with sepsis due to UTI. Blood cultures now showing GPC and GNR in 1/4 bottles so far. BCID detected Staphylococcus species. Pharmacy has been consulted for vancomycin  dosing.  Plan:  Vancomycin  1 g IV loading dose followed by maintenance regimen of 750 mg IV q24h --Calculated AUC: 473, Cmin 11.7 --Daily Scr per protocol --Levels at steady state or as clinically indicated  Height: 5' 6 (167.6 cm) Weight: 51 kg (112 lb 7 oz) IBW/kg (Calculated) : 59.3  Temp (24hrs), Avg:98.7 F (37.1 C), Min:98.4 F (36.9 C), Max:99.3 F (37.4 C)  Recent Labs  Lab 02/29/24 1649 02/29/24 1828 03/01/24 0512  WBC 19.7*  --  18.9*  CREATININE 2.07*  --  1.07*  LATICACIDVEN 0.7 0.8  --     Estimated Creatinine Clearance: 46.7 mL/min (A) (by C-G formula based on SCr of 1.07 mg/dL (H)).    No Known Allergies  Antimicrobials this admission: Ceftriaxone  8/13 >>  Vancomycin  8/14 >>   Dose adjustments this admission: N/A  Microbiology results: 8/13 BCx: 1/4 bottles GNR & GPC, BCID detected Staphylococcus spp. 8/13 UCx: pending  8/13 MRSA PCR: (-)  Thank you for allowing pharmacy to be a part of this patient's care.  Marolyn KATHEE Mare 03/01/2024 8:02 PM

## 2024-03-01 NOTE — NC FL2 (Signed)
 [place]  MEDICAID FL2 LEVEL OF CARE FORM     IDENTIFICATION  Patient Name: Allison Berry Birthdate: 08-06-1965 Sex: female Admission Date (Current Location): 02/29/2024  [place] and [place] Number:  Raynaldo [id number] Facility and Address:  [facility],  [address] [place] [address]      Provider Number: [id number]  Attending Physician Name and Address:  Pearlean Manus, MD  Relative Name and Phone Number:  Merilee Donnice Rhody)  [phone]    Current Level of Care: Hospital Recommended Level of Care: Skilled Nursing Facility Prior Approval Number:    Date Approved/Denied:   PASRR Number: [id number]  Discharge Plan: SNF    Current Diagnoses: Patient Active Problem List   Diagnosis Date Noted   Sepsis secondary to UTI (HCC) 02/29/2024   Metabolic acidosis 02/29/2024   Elevated LFTs 02/29/2024   Hypoglycemia 02/29/2024   Acute encephalopathy 02/29/2024   Depression with anxiety 02/29/2024   Rhabdomyolysis 02/29/2024   Left displaced femoral neck fracture (HCC) 10/13/2020   Accidental fall 10/13/2020   Leukocytosis 10/13/2020   Normocytic anemia 10/13/2020   Thrombocytosis 07/21/2018   Altered mental status 07/20/2018   AKI (acute kidney injury) (HCC) 07/20/2018   Hypertension 07/20/2018   Chronic back pain 07/20/2018   Dyslipidemia 07/20/2018   Hypokalemia 07/20/2018   Hypomagnesemia 07/20/2018   Hypokalemia 07/20/2018    Orientation RESPIRATION BLADDER Height & Weight     Self, Time, Place  Normal Incontinent Weight: 112 lb 7 oz (51 kg) Height:  5' 6 (167.6 cm)  BEHAVIORAL SYMPTOMS/MOOD NEUROLOGICAL BOWEL NUTRITION STATUS      Continent Diet (regular)  AMBULATORY STATUS COMMUNICATION OF NEEDS Skin   Limited Assist Verbally Normal                       Personal Care Assistance Level of Assistance  Bathing, Feeding, Dressing Bathing Assistance: Limited assistance Feeding assistance: Independent Dressing Assistance:  Limited assistance     Functional Limitations Info  Sight, Hearing, Speech Sight Info: Adequate Hearing Info: Adequate Speech Info: Adequate    SPECIAL CARE FACTORS FREQUENCY  PT (By licensed PT), OT (By licensed OT)     PT Frequency: 5x/wk OT Frequency: 5x/wk            Contractures Contractures Info: Not present    Additional Factors Info  Code Status, Allergies Code Status Info: FULL Allergies Info: No Known Allergies           Current Medications (03/01/2024):  This is the current hospital active medication list Current Facility-Administered Medications  Medication Dose Route Frequency Provider Last Rate Last Admin   0.9 %  sodium chloride  infusion  250 mL Intravenous Continuous Opyd, Timothy S, MD       acetaminophen  (TYLENOL ) tablet 650 mg  650 mg Oral Q6H PRN Opyd, Timothy S, MD       Or   acetaminophen  (TYLENOL ) suppository 650 mg  650 mg Rectal Q6H PRN Opyd, Timothy S, MD       artificial tears ophthalmic solution 1 drop  1 drop Both Eyes PRN Emokpae, Courage, MD       cefTRIAXone  (ROCEPHIN ) 2 g in sodium chloride  0.9 % 100 mL IVPB  2 g Intravenous Q24H Opyd, Timothy S, MD       Chlorhexidine  Gluconate Cloth 2 % PADS 6 each  6 each Topical Daily Opyd, Timothy S, MD   6 each at 03/01/24 0916   clonazePAM  (KLONOPIN ) tablet 1 mg  1 mg Oral BID  PRN Opyd, Timothy S, MD       gabapentin  (NEURONTIN ) capsule 300 mg  300 mg Oral BID Opyd, Timothy S, MD   300 mg at 03/01/24 0828   lamoTRIgine  (LAMICTAL ) tablet 100 mg  100 mg Oral QHS Opyd, Timothy S, MD   100 mg at 02/29/24 2132   lidocaine -EPINEPHrine  (XYLOCAINE  W/EPI) 2 %-1:200000 (PF) injection 10 mL  10 mL Other Once Cleotilde Rogue, MD       methocarbamol  (ROBAXIN ) tablet 500 mg  500 mg Oral Q6H PRN Opyd, Timothy S, MD       norepinephrine  (LEVOPHED ) 4mg  in (0.016 mg/mL) premix infusion  0-10 mcg/min Intravenous Titrated Opyd, Timothy S, MD   Stopped at 03/01/24 0536   ondansetron  (ZOFRAN ) tablet 4 mg  4 mg Oral  Q6H PRN Opyd, Timothy S, MD       Or   ondansetron  (ZOFRAN ) injection 4 mg  4 mg Intravenous Q6H PRN Opyd, Timothy S, MD       oxyCODONE  (Oxy IR/ROXICODONE ) immediate release tablet 5-10 mg  5-10 mg Oral Q4H PRN Opyd, Timothy S, MD   10 mg at 03/01/24 1433   sodium chloride  flush (NS) 0.9 % injection 3 mL  3 mL Intravenous Q12H Opyd, Timothy S, MD   3 mL at 03/01/24 9083     Discharge Medications: Please see discharge summary for a list of discharge medications.  Relevant Imaging Results:  Relevant Lab Results:   Additional Information SSN: [id number]  Hoy DELENA Bigness, LCSW

## 2024-03-01 NOTE — TOC Progression Note (Signed)
 Transition of Care [facility]) - Progression Note    Patient Details  Name: Allison Berry MRN: [id number] Date of Birth: 23-Dec-1965  [facility] CM/SW Contact  Hoy DELENA Bigness, LCSW Phone Number: 03/01/2024, 2:59 PM  Clinical Narrative:    Pt recommended for STR. Met with pt who shares she has not been to SNF before. She is unsure if her insurance covers SNF placement but, is agreeable to have referrals sent out for placement and does not have a facility preference at this time. Pt is aware that if insurance does not cover SNF she will have to return home with HH vs OPPT.  Referrals have been faxed out and currently awaiting bed offers.    Expected Discharge Plan: Home/Self Care Barriers to Discharge: Continued Medical Work up               Expected Discharge Plan and Services In-house Referral: Clinical Social Work Discharge Planning Services: NA Post Acute Care Choice: NA Living arrangements for the past 2 months: Mobile Home                 DME Arranged: N/A DME Agency: NA                   Social Drivers of Health (SDOH) Interventions SDOH Screenings   Food Insecurity: Patient Unable To Answer (02/29/2024)  Housing: Patient Unable To Answer (02/29/2024)  Transportation Needs: Patient Unable To Answer (02/29/2024)  Utilities: Patient Unable To Answer (02/29/2024)  Financial Resource Strain: Low Risk  (06/07/2022)   Received from [facility]  Physical Activity: Unknown (10/06/2021)   Received from [facility]  Social Connections: Somewhat Isolated (10/06/2021)   Received from [facility]  Stress: Stress Concern Present (06/07/2022)   Received from [facility]  Tobacco Use: High Risk (02/29/2024)    Readmission Risk Interventions    03/01/2024   10:34 AM  Readmission Risk Prevention Plan  Post Dischage Appt Complete  Medication Screening Complete  Transportation Screening Complete

## 2024-03-01 NOTE — Plan of Care (Signed)
  Problem: Education: Goal: Knowledge of General Education information will improve Description: Including pain rating scale, medication(s)/side effects and non-pharmacologic comfort measures Outcome: Progressing   Problem: Health Behavior/Discharge Planning: Goal: Ability to manage health-related needs will improve Outcome: Progressing   Problem: Clinical Measurements: Goal: Ability to maintain clinical measurements within normal limits will improve Outcome: Progressing   Problem: Nutrition: Goal: Adequate nutrition will be maintained Outcome: Progressing   Problem: Coping: Goal: Level of anxiety will decrease Outcome: Progressing   Problem: Pain Managment: Goal: General experience of comfort will improve and/or be controlled Outcome: Progressing

## 2024-03-01 NOTE — Progress Notes (Addendum)
 PROGRESS NOTE  Allison Berry, is a 58 y.o. female, DOB - 04-Apr-1966, FMW:[id number]  Admit date - 02/29/2024   Admitting Physician Allison GORMAN Sprinkles, MD  Outpatient Primary MD for the patient is System, Provider Not In  LOS - 1  Chief Complaint  Patient presents with   Altered Mental Status     Brief Narrative:  58 y.o. female with medical history significant for hypertension, hyperlipidemia, chronic pain, depression, and anxiety admitted on 02/29/2024 with sepsis secondary to staph bacteremia    -Assessment and Plan: 1)Severe sepsis with Septic shock secondary to Staph Bacteremia--POA  - Preliminary blood culture with staph species - Continue IV Rocephin  - Get pharmacy consult for IV Vanco Depending on organism patient may need a TTE/TEE WBC 19.7 >>18.9  -Weaned off of IV Levophed  at this time  2. Rhabdomyolysis-- Total CK 6,324 - Continue to hydrate and repeat CPK in a.m.  3)AKI----acute kidney injury with metabolic acidosis --  creatinine on admission=  2.07,  baseline creatinine = 0.7    , - creatinine is now=1.07  ,  -Continue to hold lisinopril /hydrochlorothiazide  -Renal function metabolic acidosis improved -renally adjust medications, avoid nephrotoxic agents / dehydration  / hypotension   4)Acute Encephalopathy  -Suspect due to #1 and #3 above -CT head without acute findings -Mentation significantly improved with treatment of #1 #3 above   5)Chronic Anemia--- Hgb stable at this time (close to baseline) -Anticipate possible drop in hemoglobin due to hemodilution -No bleeding concerns -Serum iron , ferritin folate and B12 are not low--   6. Chronic pain  -Be judicious with narcotics given hypotension -Okay to use as needed oxycodone  sparingly -Continue gabapentin   7. Hypokalemia--PTA patient was using hydrochlorothiazide  -  HCTZ has been discontinued, give potassium supplementation --When corrected for low albumin  patient's calcium  is normal   8)Generalized  Weakness--- PT eval appreciated recommends SNF rehab   9) Elevated LFTs --?? ETOH AST >> ALT -Suspect shock liver in the setting of hypotension due to #1 above, compounded by rhabdomyolysis --LFTs trending down with hydration and improvement in hemodynamics    Latest Ref Rng & Units 03/01/2024    5:12 AM 02/29/2024    4:49 PM 10/16/2020    2:14 AM  Hepatic Function  Total Protein 6.5 - 8.1 g/dL 5.7  5.7  6.3   Albumin  3.5 - 5.0 g/dL 3.2  3.2  2.7   AST 15 - 41 U/L 82  123  22   ALT 0 - 44 U/L 45  51  25   Alk Phosphatase 38 - 126 U/L 71  78  64   Total Bilirubin 0.0 - 1.2 mg/dL 1.7  1.7  0.5    Status is: Inpatient   Disposition: The patient is from: Home              Anticipated d/c is to: SNF              Anticipated d/c date is: 2 days              Patient currently is not medically stable to d/c. Barriers: Not Clinically Stable-   Code Status :  -  Code Status: Full Code   Family Communication:    (patient is alert, awake and coherent)   DVT Prophylaxis  :   - SCDs   SCDs Start: 02/29/24 2031   Lab Results  Component Value Date   PLT 439 (H) 03/01/2024    Inpatient Medications  Scheduled Meds:  Chlorhexidine  Gluconate Cloth  6 each Topical Daily   gabapentin   300 mg Oral BID   lamoTRIgine   100 mg Oral QHS   lidocaine -EPINEPHrine   10 mL Other Once   sodium chloride  flush  3 mL Intravenous Q12H   Continuous Infusions:  sodium chloride      sodium chloride      cefTRIAXone  (ROCEPHIN )  IV 2 g (03/01/24 1530)   PRN Meds:.acetaminophen  **OR** acetaminophen , artificial tears, clonazePAM , methocarbamol , ondansetron  **OR** ondansetron  (ZOFRAN ) IV, oxyCODONE    Anti-infectives (From admission, onward)    Start     Dose/Rate Route Frequency Ordered Stop   03/01/24 1645  cefTRIAXone  (ROCEPHIN ) 2 g in sodium chloride  0.9 % 100 mL IVPB        2 g 200 mL/hr over 30 Minutes Intravenous Every 24 hours 02/29/24 2033     02/29/24 1645  cefTRIAXone  (ROCEPHIN ) 2 g in sodium  chloride 0.9 % 100 mL IVPB        2 g 200 mL/hr over 30 Minutes Intravenous Once 02/29/24 1644 02/29/24 1823         Subjective: Allison Berry today has no fevers, no emesis,  No chest pain,    Patient complains of fatigue and generalized weakness - Weaned off oxygen - Weaned off IV Levophed  - Voiding well   Objective: Vitals:   03/01/24 1140 03/01/24 1440 03/01/24 1630 03/01/24 1811  BP: 126/69 107/86 101/85 109/78  Pulse: (!) 103 100 99 98  Resp:      Temp:    98.4 F (36.9 C)  TempSrc:    Oral  SpO2: 98% 100% 97% 95%  Weight:      Height:        Intake/Output Summary (Last 24 hours) at 03/01/2024 1958 Last data filed at 03/01/2024 9075 Gross per 24 hour  Intake 1987.58 ml  Output 1200 ml  Net 787.58 ml   Filed Weights   02/29/24 1654 02/29/24 2100  Weight: 54.4 kg 51 kg    Physical Exam  Gen:- Awake Alert,  in no apparent distress  HEENT:- [place].AT, No sclera icterus Neck-Supple Neck,No JVD,.  Lungs-  CTAB , fair symmetrical air movement CV- S1, S2 normal, regular  Abd-  +ve B.Sounds, Abd Soft, No tenderness,    Extremity/Skin:- No  edema, pedal pulses present  Psych-affect is appropriate, oriented x3 Neuro-no new focal deficits, no tremors  Data Reviewed: I have personally reviewed following labs and imaging studies  CBC: Recent Labs  Lab 02/29/24 1649 03/01/24 0512  WBC 19.7* 18.9*  NEUTROABS 15.6*  --   HGB 7.6* 8.4*  HCT 24.0* 25.9*  MCV 89.9 88.1  PLT 432* 439*   Basic Metabolic Panel: Recent Labs  Lab 02/29/24 1649 03/01/24 0512  NA 143 145  K 3.1* 2.9*  CL 112* 111  CO2 12* 19*  GLUCOSE 55* 88  BUN 62* 42*  CREATININE 2.07* 1.07*  CALCIUM  7.4* 8.1*  MG  --  2.1   GFR: Estimated Creatinine Clearance: 46.7 mL/min (A) (by C-G formula based on SCr of 1.07 mg/dL (H)). Liver Function Tests: Recent Labs  Lab 02/29/24 1649 03/01/24 0512  AST 123* 82*  ALT 51* 45*  ALKPHOS 78 71  BILITOT 1.7* 1.7*  PROT 5.7* 5.7*  ALBUMIN   3.2* 3.2*   Cardiac Enzymes: Recent Labs  Lab 02/29/24 1658  CKTOTAL 6,324*   Recent Results (from the past 240 hours)  Blood Culture (routine x 2)     Status: None (Preliminary result)   Collection Time: 02/29/24  4:52 PM  Specimen: BLOOD  Result Value Ref Range Status   Specimen Description BLOOD LEFT ANTECUBITAL  Final   Special Requests   Final    BOTTLES DRAWN AEROBIC ONLY Blood Culture results may not be optimal due to an inadequate volume of blood received in culture bottles   Culture   Final    NO GROWTH < 24 HOURS Performed at [facility], [address] [place], [place] [address]    Report Status PENDING  Incomplete  Blood Culture (routine x 2)     Status: None (Preliminary result)   Collection Time: 02/29/24  4:58 PM   Specimen: BLOOD  Result Value Ref Range Status   Specimen Description   Final    BLOOD BLOOD RIGHT HAND Performed at [facility], [address] [place], [place] [address]    Special Requests   Final    BOTTLES DRAWN AEROBIC ONLY Blood Culture results may not be optimal due to an inadequate volume of blood received in culture bottles Performed at [facility], [address] [place], [place] [address]    Culture  Setup Time   Final    AEROBIC BOTTLE ONLY GRAM POSITIVE COCCI GRAM NEGATIVE RODS Gram Stain Report Called to,Read Back By and Verified With: JENNIFER KING @ 1259 ON 03/01/24 C VARNER CRITICAL RESULT CALLED TO, READ BACK BY AND VERIFIED WITH: RN N. JACKSON [phone] @ [phone] FH Performed at [facility], [address], [place], [place] [address]    Culture GRAM POSITIVE COCCI GRAM NEGATIVE RODS   Final   Report Status PENDING  Incomplete  Blood Culture ID Panel (Reflexed)     Status: Abnormal   Collection Time: 02/29/24  4:58 PM  Result Value Ref Range Status   Enterococcus faecalis NOT DETECTED NOT DETECTED Final   Enterococcus Faecium NOT DETECTED NOT DETECTED Final   Listeria monocytogenes NOT DETECTED NOT DETECTED Final    Staphylococcus species DETECTED (A) NOT DETECTED Final    Comment: CRITICAL RESULT CALLED TO, READ BACK BY AND VERIFIED WITH: RN N. JACKSON [id number] @ 1821 FH    Staphylococcus aureus (BCID) NOT DETECTED NOT DETECTED Final   Staphylococcus epidermidis NOT DETECTED NOT DETECTED Final   Staphylococcus lugdunensis NOT DETECTED NOT DETECTED Final   Streptococcus species NOT DETECTED NOT DETECTED Final   Streptococcus agalactiae NOT DETECTED NOT DETECTED Final   Streptococcus pneumoniae NOT DETECTED NOT DETECTED Final   Streptococcus pyogenes NOT DETECTED NOT DETECTED Final   A.calcoaceticus-baumannii NOT DETECTED NOT DETECTED Final   Bacteroides fragilis NOT DETECTED NOT DETECTED Final   Enterobacterales NOT DETECTED NOT DETECTED Final   Enterobacter cloacae complex NOT DETECTED NOT DETECTED Final   Escherichia coli NOT DETECTED NOT DETECTED Final   Klebsiella aerogenes NOT DETECTED NOT DETECTED Final   Klebsiella oxytoca NOT DETECTED NOT DETECTED Final   Klebsiella pneumoniae NOT DETECTED NOT DETECTED Final   Proteus species NOT DETECTED NOT DETECTED Final   Salmonella species NOT DETECTED NOT DETECTED Final   Serratia marcescens NOT DETECTED NOT DETECTED Final   Haemophilus influenzae NOT DETECTED NOT DETECTED Final   Neisseria meningitidis NOT DETECTED NOT DETECTED Final   Pseudomonas aeruginosa NOT DETECTED NOT DETECTED Final   Stenotrophomonas maltophilia NOT DETECTED NOT DETECTED Final   Candida albicans NOT DETECTED NOT DETECTED Final   Candida auris NOT DETECTED NOT DETECTED Final   Candida glabrata NOT DETECTED NOT DETECTED Final   Candida krusei NOT DETECTED NOT DETECTED Final   Candida parapsilosis NOT DETECTED NOT DETECTED  Final   Candida tropicalis NOT DETECTED NOT DETECTED Final   Cryptococcus neoformans/gattii NOT DETECTED NOT DETECTED Final    Comment: Performed at [facility], [address], [place], [place] [address]  Resp panel by RT-PCR (RSV, Flu A&B, Covid)  Anterior Nasal Swab     Status: None   Collection Time: 02/29/24  5:04 PM   Specimen: Anterior Nasal Swab  Result Value Ref Range Status   SARS Coronavirus 2 by RT PCR NEGATIVE NEGATIVE Final    Comment: (NOTE) SARS-CoV-2 target nucleic acids are NOT DETECTED.  The SARS-CoV-2 RNA is generally detectable in upper respiratory specimens during the acute phase of infection. The lowest concentration of SARS-CoV-2 viral copies this assay can detect is 138 copies/mL. A negative result does not preclude SARS-Cov-2 infection and should not be used as the sole basis for treatment or other patient management decisions. A negative result may occur with  improper specimen collection/handling, submission of specimen other than nasopharyngeal swab, presence of viral mutation(s) within the areas targeted by this assay, and inadequate number of viral copies(<138 copies/mL). A negative result must be combined with clinical observations, patient history, and epidemiological information. The expected result is Negative.  Fact Sheet for Patients:  BloggerCourse.com  Fact Sheet for Healthcare Providers:  SeriousBroker.it  This test is no t yet approved or cleared by the [place]  FDA and  has been authorized for detection and/or diagnosis of SARS-CoV-2 by FDA under an Emergency Use Authorization (EUA). This EUA will remain  in effect (meaning this test can be used) for the duration of the COVID-19 declaration under Section 564(b)(1) of the Act, 21 U.S.C.section 360bbb-3(b)(1), unless the authorization is terminated  or revoked sooner.       Influenza A by PCR NEGATIVE NEGATIVE Final   Influenza B by PCR NEGATIVE NEGATIVE Final    Comment: (NOTE) The Xpert Xpress SARS-CoV-2/FLU/RSV plus assay is intended as an aid in the diagnosis of influenza from Nasopharyngeal swab specimens and should not be used as a sole basis for treatment. Nasal washings  and aspirates are unacceptable for Xpert Xpress SARS-CoV-2/FLU/RSV testing.  Fact Sheet for Patients: BloggerCourse.com  Fact Sheet for Healthcare Providers: SeriousBroker.it  This test is not yet approved or cleared by the [place]  FDA and has been authorized for detection and/or diagnosis of SARS-CoV-2 by FDA under an Emergency Use Authorization (EUA). This EUA will remain in effect (meaning this test can be used) for the duration of the COVID-19 declaration under Section 564(b)(1) of the Act, 21 U.S.C. section 360bbb-3(b)(1), unless the authorization is terminated or revoked.     Resp Syncytial Virus by PCR NEGATIVE NEGATIVE Final    Comment: (NOTE) Fact Sheet for Patients: BloggerCourse.com  Fact Sheet for Healthcare Providers: SeriousBroker.it  This test is not yet approved or cleared by the [place]  FDA and has been authorized for detection and/or diagnosis of SARS-CoV-2 by FDA under an Emergency Use Authorization (EUA). This EUA will remain in effect (meaning this test can be used) for the duration of the COVID-19 declaration under Section 564(b)(1) of the Act, 21 U.S.C. section 360bbb-3(b)(1), unless the authorization is terminated or revoked.  Performed at [facility], [address] [place], [place] [address]   MRSA Next Gen by PCR, Nasal     Status: None   Collection Time: 02/29/24  7:48 PM   Specimen: Nasal Mucosa; Nasal Swab  Result Value Ref Range Status   MRSA by PCR Next Gen NOT DETECTED NOT DETECTED  Final    Comment: (NOTE) The GeneXpert MRSA Assay (FDA approved for NASAL specimens only), is one component of a comprehensive MRSA colonization surveillance program. It is not intended to diagnose MRSA infection nor to guide or monitor treatment for MRSA infections. Test performance is not FDA approved in patients less than 59 years old. Performed  at [facility], [address], [place], [place] [address]     Radiology Studies: CT Head Wo Contrast Result Date: 02/29/2024 EXAM: CT HEAD WITHOUT CONTRAST 02/29/2024 06:15:40 PM TECHNIQUE: CT of the head was performed without the administration of intravenous contrast. Automated exposure control, iterative reconstruction, and/or weight based adjustment of the mA/kV was utilized to reduce the radiation dose to as low as reasonably achievable. COMPARISON: CT of the head dated 07/20/2000. The study is degraded by patient motion. CLINICAL HISTORY: Altered mental status, nontraumatic (Ped 0-17y). Pt home health nurse found pt on porch with pants to ankles. Pt door was locked an seemed as if she may have been outside for 2 days. Pt is dirty, altered, and figety. FINDINGS: BRAIN AND VENTRICLES: No acute hemorrhage. Gray-white differentiation is preserved. No hydrocephalus. No extra-axial collection. No mass effect or midline shift. ORBITS: No acute abnormality. SINUSES: No acute abnormality. SOFT TISSUES AND SKULL: No acute soft tissue abnormality. No skull fracture. IMPRESSION: 1. No acute intracranial abnormality. 2. Study degraded by patient motion. Electronically signed by: Allison coho 02/29/2024 08:02 PM EDT RP Workstation: [id number]   DG Chest Port 1 View Result Date: 02/29/2024 CLINICAL DATA:  Questionable sepsis - evaluate for abnormality EXAM: PORTABLE CHEST - 1 VIEW COMPARISON:  December 21, 2023 FINDINGS: Lower lung volumes. No focal airspace consolidation, pleural effusion, or pneumothorax. No cardiomegaly. Tortuous aorta. No acute fracture or destructive lesions. Multilevel thoracic osteophytosis. Cervical fusion hardware, partially visualized. IMPRESSION: No acute cardiopulmonary abnormality. Electronically Signed   By: Rogelia Myers M.D.   On: 02/29/2024 19:27   Scheduled Meds:  Chlorhexidine  Gluconate Cloth  6 each Topical Daily   gabapentin   300 mg Oral BID   lamoTRIgine   100 mg Oral QHS    lidocaine -EPINEPHrine   10 mL Other Once   sodium chloride  flush  3 mL Intravenous Q12H   Continuous Infusions:  sodium chloride      sodium chloride      cefTRIAXone  (ROCEPHIN )  IV 2 g (03/01/24 1530)    LOS: 1 day   Rendall Carwin M.D on 03/01/2024 at 7:58 PM  Go to [email or web address] - for contact info  [facility] - Office  [phone]  If 7PM-7AM, please contact night-coverage [email or web address] 03/01/2024, 7:58 PM

## 2024-03-01 NOTE — Plan of Care (Signed)
  Problem: Acute Rehab PT Goals(only PT should resolve) Goal: Pt Will Go Supine/Side To Sit Outcome: Progressing Flowsheets (Taken 03/01/2024 1358) Pt will go Supine/Side to Sit:  with modified independence  with supervision Goal: Patient Will Transfer Sit To/From Stand Outcome: Progressing Flowsheets (Taken 03/01/2024 1358) Patient will transfer sit to/from stand:  with contact guard assist  with minimal assist Goal: Pt Will Transfer Bed To Chair/Chair To Bed Outcome: Progressing Flowsheets (Taken 03/01/2024 1358) Pt will Transfer Bed to Chair/Chair to Bed:  with contact guard assist  with min assist Goal: Pt Will Ambulate Outcome: Progressing Flowsheets (Taken 03/01/2024 1358) Pt will Ambulate:  75 feet  with contact guard assist  with minimal assist  with rolling walker   1:59 PM, 03/01/24 Lynwood Music, MPT Physical Therapist with [facility] [phone] office [phone] mobile phone

## 2024-03-01 NOTE — Evaluation (Signed)
 Physical Therapy Evaluation Patient Details Name: Allison Berry MRN: [id number] DOB: [birth year] Today's Date: 03/01/2024  History of Present Illness  Allison Berry is a 58 y.o. female with medical history significant for hypertension, hyperlipidemia, chronic pain, depression, and anxiety who is brought in with altered mental status after being locked out of her house and remaining outside for the past 2 days.     Patient cannot be reached by phone and EMS was asked to do a welfare check.  They found the patient outside of her house, soaking wet, apparently locked out.  She is confused and had systolic blood pressure in the 80s.  She is not providing much meaningful history in the ED.   Clinical Impression  Patient demonstrates fair return for getting into/out of bed requiring increased time, has to lean on armrest of chair for support during transfers without AD, safer using RW, had difficulty transferring to commode, tolerated walking in hallway with slow labored movement and limited mostly due to fatigue. Patient tolerated staying up in chair after therapy. Patient will benefit from continued skilled physical therapy in hospital and recommended venue below to increase strength, balance, endurance for safe ADLs and gait.          If plan is discharge home, recommend the following: A little help with walking and/or transfers;A little help with bathing/dressing/bathroom;Help with stairs or ramp for entrance;Assistance with cooking/housework   Can travel by private vehicle   Yes    Equipment Recommendations None recommended by PT  Recommendations for Other Services       Functional Status Assessment Patient has had a recent decline in their functional status and demonstrates the ability to make significant improvements in function in a reasonable and predictable amount of time.     Precautions / Restrictions Precautions Precautions: Fall Recall of Precautions/Restrictions:  Intact Restrictions Weight Bearing Restrictions Per Provider Order: No      Mobility  Bed Mobility Overal bed mobility: Needs Assistance Bed Mobility: Supine to Sit, Sit to Supine     Supine to sit: Supervision     General bed mobility comments: increased time with labored movement    Transfers Overall transfer level: Needs assistance Equipment used: Rolling walker (2 wheels) Transfers: Sit to/from Stand, Bed to chair/wheelchair/BSC Sit to Stand: Min assist   Step pivot transfers: Min assist       General transfer comment: has to lean on armrest of chair and hand held assist when not using an AD, safer using RW    Ambulation/Gait Ambulation/Gait assistance: Min assist Gait Distance (Feet): 45 Feet Assistive device: Rolling walker (2 wheels) Gait Pattern/deviations: Decreased step length - right, Decreased step length - left, Decreased stride length, Trunk flexed Gait velocity: decreased     General Gait Details: slow labored unsteady movement without loss of balance, limited mostly due to c/o fatigue  Stairs            Wheelchair Mobility     Tilt Bed    Modified Rankin (Stroke Patients Only)       Balance Overall balance assessment: Needs assistance Sitting-balance support: Feet supported, No upper extremity supported Sitting balance-Leahy Scale: Fair Sitting balance - Comments: fair/good seated at EOB   Standing balance support: During functional activity, No upper extremity supported Standing balance-Leahy Scale: Poor Standing balance comment: fair/poor using RW                             Pertinent  Vitals/Pain Pain Assessment Pain Assessment: 0-10 Pain Score: 5  Pain Location: right hip and low back Pain Descriptors / Indicators: Discomfort, Grimacing, Sore Pain Intervention(s): Limited activity within patient's tolerance, Monitored during session, Repositioned    Home Living Family/patient expects to be discharged to::  Private residence Living Arrangements: Alone Available Help at Discharge: Family;Available PRN/intermittently Type of Home: House Home Access: Stairs to enter Entrance Stairs-Rails: Right;Left (to wide to reach both) Entrance Stairs-Number of Steps: 4   Home Layout: One level Home Equipment: Agricultural consultant (2 wheels);Cane - single point;BSC/3in1;Shower seat      Prior Function Prior Level of Function : Independent/Modified Independent             Mobility Comments: household ambulation leaning on furniture and walls, uses RW for longer distances out side, has not been driving lately ADLs Comments: Independent, orders groceries     Extremity/Trunk Assessment   Upper Extremity Assessment Upper Extremity Assessment: Generalized weakness    Lower Extremity Assessment Lower Extremity Assessment: Generalized weakness    Cervical / Trunk Assessment Cervical / Trunk Assessment: Kyphotic  Communication   Communication Communication: No apparent difficulties    Cognition Arousal: Alert Behavior During Therapy: WFL for tasks assessed/performed                             Following commands: Intact       Cueing Cueing Techniques: Verbal cues, Tactile cues     General Comments      Exercises     Assessment/Plan    PT Assessment Patient needs continued PT services  PT Problem List Decreased strength;Decreased activity tolerance;Decreased balance;Decreased mobility       PT Treatment Interventions DME instruction;Gait training;Stair training;Functional mobility training;Therapeutic activities;Therapeutic exercise;Balance training;Patient/family education    PT Goals (Current goals can be found in the Care Plan section)  Acute Rehab PT Goals Patient Stated Goal: return home with assist PT Goal Formulation: With patient Time For Goal Achievement: 03/15/24 Potential to Achieve Goals: Good    Frequency Min 3X/week     Co-evaluation                AM-PAC PT 6 Clicks Mobility  Outcome Measure Help needed turning from your back to your side while in a flat bed without using bedrails?: None Help needed moving from lying on your back to sitting on the side of a flat bed without using bedrails?: A Little Help needed moving to and from a bed to a chair (including a wheelchair)?: A Little Help needed standing up from a chair using your arms (e.g., wheelchair or bedside chair)?: A Little Help needed to walk in hospital room?: A Lot Help needed climbing 3-5 steps with a railing? : A Lot 6 Click Score: 17    End of Session   Activity Tolerance: Patient tolerated treatment well;Patient limited by fatigue Patient left: in chair;with call bell/phone within reach Nurse Communication: Mobility status PT Visit Diagnosis: Unsteadiness on feet (R26.81);Other abnormalities of gait and mobility (R26.89);Muscle weakness (generalized) (M62.81)    Time: 8940-8867 PT Time Calculation (min) (ACUTE ONLY): 33 min   Charges:   PT Evaluation $PT Eval Moderate Complexity: 1 Mod PT Treatments $Therapeutic Activity: 23-37 mins PT General Charges $$ ACUTE PT VISIT: 1 Visit         1:57 PM, 03/01/24 Lynwood Music, MPT Physical Therapist with [facility] [phone] office [phone] mobile phone
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
## 2024-03-01 NOTE — Progress Notes (Signed)
 Date and time results received: 03/01/24 1302 (use smartphrase .now to insert current time)  Test: Positive Blood Culture  Critical Value: Aerobic Bottle gram positive cocci and gram negative rods  Name of Provider Notified: Dr Rendall and Patient RN  Orders Received? Or Actions Taken?: No new orders currently

## 2024-03-02 DIAGNOSIS — M549 Dorsalgia, unspecified: Secondary | ICD-10-CM | POA: Diagnosis not present

## 2024-03-02 DIAGNOSIS — F418 Other specified anxiety disorders: Secondary | ICD-10-CM

## 2024-03-02 DIAGNOSIS — D649 Anemia, unspecified: Secondary | ICD-10-CM | POA: Diagnosis not present

## 2024-03-02 DIAGNOSIS — A419 Sepsis, unspecified organism: Secondary | ICD-10-CM | POA: Diagnosis not present

## 2024-03-02 DIAGNOSIS — N179 Acute kidney failure, unspecified: Secondary | ICD-10-CM | POA: Diagnosis not present

## 2024-03-02 LAB — COMPREHENSIVE METABOLIC PANEL WITH GFR
ALT: 35 U/L (ref 0–44)
AST: 41 U/L (ref 15–41)
Albumin: 2.7 g/dL — ABNORMAL LOW (ref 3.5–5.0)
Alkaline Phosphatase: 62 U/L (ref 38–126)
Anion gap: 6 (ref 5–15)
BUN: 18 mg/dL (ref 6–20)
CO2: 23 mmol/L (ref 22–32)
Calcium: 8.3 mg/dL — ABNORMAL LOW (ref 8.9–10.3)
Chloride: 110 mmol/L (ref 98–111)
Creatinine, Ser: 0.52 mg/dL (ref 0.44–1.00)
GFR, Estimated: >60 mL/min (ref >60)
Glucose, Bld: 109 mg/dL — ABNORMAL HIGH (ref 70–99)
Potassium: 3.6 mmol/L (ref 3.5–5.1)
Sodium: 139 mmol/L (ref 135–145)
Total Bilirubin: 0.2 mg/dL (ref 0.0–1.2)
Total Protein: 5.2 g/dL — ABNORMAL LOW (ref 6.5–8.1)

## 2024-03-02 LAB — CBC
HCT: 24.3 % — ABNORMAL LOW (ref 36.0–46.0)
Hemoglobin: 7.9 g/dL — ABNORMAL LOW (ref 12.0–15.0)
MCH: 29.3 pg (ref 26.0–34.0)
MCHC: 32.5 g/dL (ref 30.0–36.0)
MCV: 90 fL (ref 80.0–100.0)
Platelets: 396 K/uL (ref 150–400)
RBC: 2.7 MIL/uL — ABNORMAL LOW (ref 3.87–5.11)
RDW: 17 % — ABNORMAL HIGH (ref 11.5–15.5)
WBC: 11.7 K/uL — ABNORMAL HIGH (ref 4.0–10.5)
nRBC: 0 % (ref 0.0–0.2)

## 2024-03-02 LAB — MAGNESIUM: Magnesium: 1.8 mg/dL (ref 1.7–2.4)

## 2024-03-02 LAB — HEMOGLOBIN A1C
Hgb A1c MFr Bld: 5.2 % (ref 4.8–5.6)
Mean Plasma Glucose: 103 mg/dL

## 2024-03-02 LAB — CK: Total CK: 1158 U/L — ABNORMAL HIGH (ref 38–234)

## 2024-03-02 MED ORDER — POTASSIUM CHLORIDE CRYS ER 20 MEQ PO TBCR
40.0000 meq | EXTENDED_RELEASE_TABLET | Freq: Once | ORAL | Status: AC
  Administered 2024-03-02: 40 meq via ORAL
  Filled 2024-03-02: qty 2

## 2024-03-02 MED ORDER — LACTATED RINGERS IV SOLN
INTRAVENOUS | Status: DC
  Administered 2024-03-02: 1000 mL via INTRAVENOUS

## 2024-03-02 MED ORDER — DICLOFENAC SODIUM 1 % EX GEL
2.0000 g | Freq: Four times a day (QID) | CUTANEOUS | Status: DC
  Administered 2024-03-02 – 2024-03-06 (×13): 2 g via TOPICAL
  Filled 2024-03-02: qty 100

## 2024-03-02 MED ORDER — MAGNESIUM SULFATE 2 GM/50ML IV SOLN
2.0000 g | Freq: Once | INTRAVENOUS | Status: AC
  Administered 2024-03-02: 2 g via INTRAVENOUS
  Filled 2024-03-02: qty 50
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
## 2024-03-02 NOTE — Assessment & Plan Note (Addendum)
 Septic shock, with hypotension and acute metabolic encephalopathy. Present on admission. Resolved.   Urine culture positive for E coli > 100.000 CFU poly sensitive  Blood culture only one bottle staphylococcus (contamination)    Wbc is 7,4 with resolution of leukocytosis.    Antibiotic to oral cephalexin , to complete 03/07/24  Acute encephalopathy has resolved.

## 2024-03-02 NOTE — Plan of Care (Signed)

## 2024-03-02 NOTE — Assessment & Plan Note (Addendum)
 Rhabdomyolysis. Hypokalemia. Resolved.   Renal function stable with serum cr at 0,42, K is 4.0 and serum bicarbonate at 25  Na 135 and Mg 1.8   Patient tolerating po well, hold on further bmp for now.

## 2024-03-02 NOTE — Assessment & Plan Note (Addendum)
 Follow up hgb 9.1  Iron  panel with serum iron  89, TOBC 230, transferrin saturation 39 and ferritin 293, consistent with anemia of chronic disease.  No signs of active bleeding
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
## 2024-03-02 NOTE — Plan of Care (Signed)
   Problem: Education: Goal: Knowledge of General Education information will improve Description Including pain rating scale, medication(s)/side effects and non-pharmacologic comfort measures Outcome: Progressing   Problem: Health Behavior/Discharge Planning: Goal: Ability to manage health-related needs will improve Outcome: Progressing

## 2024-03-02 NOTE — Assessment & Plan Note (Addendum)
 Patient follows with pain clinic.  Plan to continue oxycodone  per her home regimen of 15 mg IR q 6 hrs prn and  gabapentin  800 mg tid Continue with lamotrigine , clonazepam , and acetaminophen .   Duloxetine  60 mg  Continue with topical diclofenac  to right knee.  Add lidocaine  patch

## 2024-03-02 NOTE — Assessment & Plan Note (Signed)
 Continue clonazepam  as needed
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
## 2024-03-03 DIAGNOSIS — N179 Acute kidney failure, unspecified: Secondary | ICD-10-CM | POA: Diagnosis not present

## 2024-03-03 DIAGNOSIS — D649 Anemia, unspecified: Secondary | ICD-10-CM | POA: Diagnosis not present

## 2024-03-03 DIAGNOSIS — M549 Dorsalgia, unspecified: Secondary | ICD-10-CM | POA: Diagnosis not present

## 2024-03-03 DIAGNOSIS — A419 Sepsis, unspecified organism: Secondary | ICD-10-CM | POA: Diagnosis not present

## 2024-03-03 LAB — URINE CULTURE: Culture: >=100000 — AB

## 2024-03-03 LAB — RENAL FUNCTION PANEL
Albumin: 2.9 g/dL — ABNORMAL LOW (ref 3.5–5.0)
Anion gap: 9 (ref 5–15)
BUN: 11 mg/dL (ref 6–20)
CO2: 25 mmol/L (ref 22–32)
Calcium: 8.4 mg/dL — ABNORMAL LOW (ref 8.9–10.3)
Chloride: 102 mmol/L (ref 98–111)
Creatinine, Ser: 0.49 mg/dL (ref 0.44–1.00)
GFR, Estimated: >60 mL/min (ref >60)
Glucose, Bld: 106 mg/dL — ABNORMAL HIGH (ref 70–99)
Phosphorus: 2.3 mg/dL — ABNORMAL LOW (ref 2.5–4.6)
Potassium: 4.1 mmol/L (ref 3.5–5.1)
Sodium: 136 mmol/L (ref 135–145)

## 2024-03-03 LAB — MAGNESIUM: Magnesium: 1.8 mg/dL (ref 1.7–2.4)

## 2024-03-03 LAB — CK: Total CK: 684 U/L — ABNORMAL HIGH (ref 38–234)

## 2024-03-03 MED ORDER — ENSURE PLUS HIGH PROTEIN PO LIQD
237.0000 mL | Freq: Two times a day (BID) | ORAL | Status: DC
  Administered 2024-03-03 – 2024-03-06 (×6): 237 mL via ORAL

## 2024-03-03 MED ORDER — GABAPENTIN 400 MG PO CAPS
800.0000 mg | ORAL_CAPSULE | Freq: Three times a day (TID) | ORAL | Status: DC
  Administered 2024-03-03 – 2024-03-06 (×10): 800 mg via ORAL
  Filled 2024-03-03 (×10): qty 2

## 2024-03-03 MED ORDER — CEPHALEXIN 500 MG PO CAPS
500.0000 mg | ORAL_CAPSULE | Freq: Two times a day (BID) | ORAL | Status: DC
  Administered 2024-03-04 – 2024-03-06 (×5): 500 mg via ORAL
  Filled 2024-03-03 (×5): qty 1

## 2024-03-03 MED ORDER — DIPHENHYDRAMINE-ZINC ACETATE 2-0.1 % EX CREA: TOPICAL_CREAM | Freq: Two times a day (BID) | CUTANEOUS | Status: DC | PRN

## 2024-03-03 MED ORDER — OXYCODONE HCL 5 MG PO TABS
15.0000 mg | ORAL_TABLET | ORAL | Status: DC | PRN
  Administered 2024-03-03 – 2024-03-06 (×15): 15 mg via ORAL
  Filled 2024-03-03 (×15): qty 3

## 2024-03-03 MED ORDER — DULOXETINE HCL 60 MG PO CPEP
60.0000 mg | ORAL_CAPSULE | Freq: Every day | ORAL | Status: DC
  Administered 2024-03-03 – 2024-03-06 (×4): 60 mg via ORAL
  Filled 2024-03-03 (×4): qty 1

## 2024-03-03 MED ORDER — OXYCODONE HCL 5 MG PO TABS
5.0000 mg | ORAL_TABLET | Freq: Once | ORAL | Status: AC
  Administered 2024-03-03: 5 mg via ORAL
  Filled 2024-03-03: qty 1

## 2024-03-03 NOTE — Plan of Care (Signed)

## 2024-03-03 NOTE — Plan of Care (Signed)
  Problem: Coping: Goal: Level of anxiety will decrease Outcome: Not Met (add Reason)   Problem: Elimination: Goal: Will not experience complications related to bowel motility Outcome: Progressing Goal: Will not experience complications related to urinary retention Outcome: Progressing   Problem: Pain Managment: Goal: General experience of comfort will improve and/or be controlled Outcome: Progressing   Problem: Safety: Goal: Ability to remain free from injury will improve Outcome: Progressing   Problem: Skin Integrity: Goal: Risk for impaired skin integrity will decrease Outcome: Progressing
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
## 2024-03-03 NOTE — Progress Notes (Signed)
 Pt has had fair day. MD increased her pain med and gabapentin  doses, which pt states has helped with her pain and mobility greatly. Pt has complained off and on all day about being depressed, noted that pt was on antidepressant prior to hospital visit and has not had the med in several days. MD notified and home med reordered. Has eaten well today, ambulatory in room with FWW with steady gait and no difficulty. Pt states she is very worried about her puppy at home and sad that her oldest dog passed away one week ago. States that she really wants to go home tomorrow.

## 2024-03-04 DIAGNOSIS — A419 Sepsis, unspecified organism: Secondary | ICD-10-CM | POA: Diagnosis not present

## 2024-03-04 DIAGNOSIS — D649 Anemia, unspecified: Secondary | ICD-10-CM | POA: Diagnosis not present

## 2024-03-04 DIAGNOSIS — N179 Acute kidney failure, unspecified: Secondary | ICD-10-CM | POA: Diagnosis not present

## 2024-03-04 DIAGNOSIS — M549 Dorsalgia, unspecified: Secondary | ICD-10-CM | POA: Diagnosis not present

## 2024-03-04 LAB — BASIC METABOLIC PANEL WITH GFR
Anion gap: 9 (ref 5–15)
BUN: 10 mg/dL (ref 6–20)
CO2: 25 mmol/L (ref 22–32)
Calcium: 8.7 mg/dL — ABNORMAL LOW (ref 8.9–10.3)
Chloride: 101 mmol/L (ref 98–111)
Creatinine, Ser: 0.42 mg/dL — ABNORMAL LOW (ref 0.44–1.00)
GFR, Estimated: >60 mL/min (ref >60)
Glucose, Bld: 96 mg/dL (ref 70–99)
Potassium: 4 mmol/L (ref 3.5–5.1)
Sodium: 135 mmol/L (ref 135–145)

## 2024-03-04 LAB — CBC
HCT: 28.4 % — ABNORMAL LOW (ref 36.0–46.0)
Hemoglobin: 9.1 g/dL — ABNORMAL LOW (ref 12.0–15.0)
MCH: 28.8 pg (ref 26.0–34.0)
MCHC: 32 g/dL (ref 30.0–36.0)
MCV: 89.9 fL (ref 80.0–100.0)
Platelets: 409 K/uL — ABNORMAL HIGH (ref 150–400)
RBC: 3.16 MIL/uL — ABNORMAL LOW (ref 3.87–5.11)
RDW: 15.9 % — ABNORMAL HIGH (ref 11.5–15.5)
WBC: 7.4 K/uL (ref 4.0–10.5)
nRBC: 0 % (ref 0.0–0.2)

## 2024-03-04 MED ORDER — NICOTINE 21 MG/24HR TD PT24
21.0000 mg | MEDICATED_PATCH | Freq: Every day | TRANSDERMAL | Status: DC
  Administered 2024-03-04 – 2024-03-06 (×3): 21 mg via TRANSDERMAL
  Filled 2024-03-04 (×3): qty 1

## 2024-03-04 MED ORDER — LIDOCAINE 5 % EX PTCH
1.0000 | MEDICATED_PATCH | CUTANEOUS | Status: DC
  Administered 2024-03-04 – 2024-03-06 (×3): 1 via TRANSDERMAL
  Filled 2024-03-04 (×4): qty 1
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
## 2024-03-04 NOTE — Plan of Care (Signed)

## 2024-03-04 NOTE — Plan of Care (Signed)
  Problem: Education: Goal: Knowledge of General Education information will improve Description: Including pain rating scale, medication(s)/side effects and non-pharmacologic comfort measures Outcome: Progressing   Problem: Health Behavior/Discharge Planning: Goal: Ability to manage health-related needs will improve Outcome: Progressing   Problem: Clinical Measurements: Goal: Ability to maintain clinical measurements within normal limits will improve Outcome: Progressing Goal: Will remain free from infection Outcome: Progressing   Problem: Activity: Goal: Risk for activity intolerance will decrease Outcome: Progressing   Problem: Coping: Goal: Level of anxiety will decrease Outcome: Progressing   Problem: Elimination: Goal: Will not experience complications related to bowel motility Outcome: Progressing Goal: Will not experience complications related to urinary retention Outcome: Progressing   Problem: Pain Managment: Goal: General experience of comfort will improve and/or be controlled Outcome: Progressing   Problem: Safety: Goal: Ability to remain free from injury will improve Outcome: Progressing

## 2024-03-04 NOTE — TOC CM/SW Note (Signed)
 Pt prefers to go home c/family support. She states her sister, Allison Berry and brother-in-law should be able to assist. She can let us  know Monday morning.

## 2024-03-05 DIAGNOSIS — A419 Sepsis, unspecified organism: Secondary | ICD-10-CM | POA: Diagnosis not present

## 2024-03-05 DIAGNOSIS — I10 Essential (primary) hypertension: Secondary | ICD-10-CM | POA: Diagnosis not present

## 2024-03-05 DIAGNOSIS — N179 Acute kidney failure, unspecified: Secondary | ICD-10-CM | POA: Diagnosis not present

## 2024-03-05 DIAGNOSIS — M549 Dorsalgia, unspecified: Secondary | ICD-10-CM | POA: Diagnosis not present

## 2024-03-05 LAB — CULTURE, BLOOD (ROUTINE X 2): Culture: NO GROWTH

## 2024-03-05 MED ORDER — LISINOPRIL 10 MG PO TABS
10.0000 mg | ORAL_TABLET | Freq: Every day | ORAL | Status: DC
  Administered 2024-03-05 – 2024-03-06 (×2): 10 mg via ORAL
  Filled 2024-03-05 (×2): qty 1

## 2024-03-05 MED ORDER — POLYETHYLENE GLYCOL 3350 17 G PO PACK
17.0000 g | PACK | Freq: Two times a day (BID) | ORAL | Status: DC
  Administered 2024-03-05 – 2024-03-06 (×2): 17 g via ORAL
  Filled 2024-03-05 (×2): qty 1

## 2024-03-05 MED ORDER — LISINOPRIL 10 MG PO TABS: 10.0000 mg | ORAL_TABLET | Freq: Every day | ORAL | 0 refills | Status: DC

## 2024-03-05 MED ORDER — DICLOFENAC SODIUM 1 % EX GEL: 2.0000 g | Freq: Two times a day (BID) | CUTANEOUS | 0 refills | Status: AC

## 2024-03-05 MED ORDER — ENSURE PLUS HIGH PROTEIN PO LIQD
237.0000 mL | Freq: Two times a day (BID) | ORAL | 0 refills | Status: AC
Start: 2024-03-05 — End: ?

## 2024-03-05 MED ORDER — CEPHALEXIN 500 MG PO CAPS: 500.0000 mg | ORAL_CAPSULE | Freq: Two times a day (BID) | ORAL | 0 refills | Status: AC

## 2024-03-05 NOTE — Plan of Care (Signed)
  Problem: Acute Rehab OT Goals (only OT should resolve) Goal: Pt. Will Transfer To Toilet Flowsheets (Taken 03/05/2024 1149) Pt Will Transfer to Toilet:  Independently  ambulating Goal: Pt/Caregiver Will Perform Home Exercise Program Flowsheets (Taken 03/05/2024 1149) Pt/caregiver will Perform Home Exercise Program:  Increased strength  Both right and left upper extremity  Independently  Demba Nigh OT, MOT

## 2024-03-05 NOTE — Plan of Care (Signed)
   Problem: Education: Goal: Knowledge of General Education information will improve Description Including pain rating scale, medication(s)/side effects and non-pharmacologic comfort measures Outcome: Progressing   Problem: Health Behavior/Discharge Planning: Goal: Ability to manage health-related needs will improve Outcome: Progressing

## 2024-03-05 NOTE — Discharge Summary (Addendum)
 Physician Discharge Summary   Patient: Allison Berry MRN: [id number] DOB: 1966-03-10  Admit date:     02/29/2024  Discharge date: 03/05/24  Discharge Physician: Elidia Sieving Sai Zinn   PCP: System, Provider Not In   Recommendations at discharge:    Patient will continue antibiotic therapy with cephalexin  to complete on 03/07/24 Continue blood pressure control with lisinopril , hold on hydrochlorothiazide  due to risk of hypotension and electrolyte abnormalities.  Follow up with primary care in 7 to 10 days Follow up with pain clinic Adult protective services will follow up as outpatient    Discharge Diagnoses: Principal Problem:   Sepsis secondary to UTI [facility]) Active Problems:   AKI (acute kidney injury) (HCC)   Chronic back pain   Normocytic anemia   Depression with anxiety  Resolved Problems:   * No resolved hospital problems. [facility]: Allison Berry was admitted to the hospital with the working diagnosis of complicated urinary tract infection, severe sepsis with septic shock.   58 yo female with the past medical history of hypertension, hyperlipidemia, depression, chronic pain syndrome and anxiety who presented with altered mental status.  Apparently she was locked out of her home and spent 2 days outside. EMS did a welfare check and found her confused and hypotensive.  On her initial physical examination her blood pressure 126/97, HR 101, RR 24 and 02 saturation 92%. She was awake and alerted to person, lungs with no wheezing or rhonchi, heart with S1 and S2 present and regular, abdomen with no distention and no lower extremity edema.   Na 143, K 3.1 CL 112 bicarbonate 12, glucose 55 bun 62 cr 2,0  AST 123 ALT 51  T bilirubin 1,7  Wbc 19,7 hgb 7,6 plt 432  Lactic acid 0,7  Alcohol  < 15  Sars covid 19 negative Influenza negative RSV negative   Urine analysis SG 1,014, protein 30, positive nitrites, large leukocytes, moderate hgb, wbc > 50, rbc > 50   Toxicology positive for opiates.   CT head with no acute intracranial abnormalities.   Chest radiograph with right rotation, hypoinflation with no cardiomegaly, infiltrates or effusions.  EKG 101 bpm, normal axis, normal intervals, qtc 437, sinus rhythm with no significant ST segment or T wave changes.   Developed hypotension, refractive to IV fluids and required vasopressors.   08/14 off pressors, clinically improving.  08/16 back on home regiment of analgesics. Plan for SNF. Changed to po antibiotics. 08/17 pending transfer to SNF.  08/18 patient declined SNF and has decided to go home with home health services.  Complete oral antibiotic therapy.   Assessment and Plan: * Sepsis secondary to UTI (HCC) Septic shock, with hypotension and acute metabolic encephalopathy. Present on admission. Resolved.   Urine culture positive for E coli > 100.000 CFU poly sensitive  Blood culture only one bottle staphylococcus (contamination)    Wbc is 7,4 with resolution of leukocytosis.    Antibiotic to oral cephalexin , to complete 03/07/24  Acute encephalopathy has resolved.   AKI (acute kidney injury) (HCC) Rhabdomyolysis. Hypokalemia. Resolved.   Renal function stable with serum cr at 0,42, K is 4.0 and serum bicarbonate at 25  Na 135 and Mg 1.8   Essential hypertension Antihypertensive medications were held during her hospitalization.  At the time of discharge will resume lisinopril , hold on hydrochlorothiazide  for now.  Follow up as outpatient.   Chronic back pain Patient follows with pain clinic.  Plan to continue oxycodone  per her home regimen of 15 mg IR  q 4 hrs prn and  gabapentin  800 mg tid Continue with lamotrigine , clonazepam , and acetaminophen .   Duloxetine  60 mg  Continue with topical diclofenac  to right knee.   Normocytic anemia Follow up hgb 9.1  Iron  panel with serum iron  89, TOBC 230, transferrin saturation 39 and ferritin 293, consistent with anemia of chronic  disease.  No signs of active bleeding   Depression with anxiety Continue clonazepam  as needed.  Start vilazadone as outpatient.        Consultants: none  Procedures performed: none   Disposition: Home Diet recommendation:  Regular diet DISCHARGE MEDICATION: Allergies as of 03/05/2024   No Known Allergies      Medication List     STOP taking these medications    lisinopril -hydrochlorothiazide  10-12.5 MG tablet Commonly known as: ZESTORETIC    methocarbamol  500 MG tablet Commonly known as: ROBAXIN        TAKE these medications    alendronate 70 MG tablet Commonly known as: FOSAMAX Take 70 mg by mouth once a week.   cephALEXin  500 MG capsule Commonly known as: KEFLEX  Take 1 capsule (500 mg total) by mouth every 12 (twelve) hours for 2 days.   clonazePAM  1 MG tablet Commonly known as: KLONOPIN  Take 1 mg by mouth 2 (two) times daily as needed.   cyclobenzaprine 10 MG tablet Commonly known as: FLEXERIL Take 10 mg by mouth 3 (three) times daily as needed for muscle spasms.   diclofenac  Sodium 1 % Gel Commonly known as: VOLTAREN  Apply 2 g topically in the morning and at bedtime. Apply to right knee   docusate sodium  100 MG capsule Commonly known as: COLACE Take 1 capsule (100 mg total) by mouth 2 (two) times daily.   DULoxetine  60 MG capsule Commonly known as: CYMBALTA  Take 60 mg by mouth daily.   feeding supplement Liqd Take 237 mLs by mouth 2 (two) times daily between meals.   gabapentin  800 MG tablet Commonly known as: NEURONTIN  Take 800 mg by mouth 3 (three) times daily.   ibuprofen 800 MG tablet Commonly known as: ADVIL Take 200 mg by mouth every 8 (eight) hours as needed for mild pain, headache or fever.   lamoTRIgine  100 MG tablet Commonly known as: LAMICTAL  Take 100 mg by mouth at bedtime.   lisinopril  10 MG tablet Commonly known as: ZESTRIL  Take 1 tablet (10 mg total) by mouth daily.   naloxone 4 MG/0.1ML Liqd nasal spray  kit Commonly known as: NARCAN Place 0.4 mg into the nose once.   oxyCODONE  15 MG immediate release tablet Commonly known as: ROXICODONE  Take 15 mg by mouth 4 (four) times daily as needed.   Vilazodone HCl 20 MG Tabs Take 1 tablet by mouth every morning.        Discharge Exam: Filed Weights   02/29/24 1654 02/29/24 2100  Weight: 54.4 kg 51 kg   BP (!) 136/91 (BP Location: Left Arm)   Pulse 91   Temp (!) 97.4 F (36.3 C) (Oral)   Resp 16   Ht 5' 6 (1.676 m)   Wt 51 kg   SpO2 96%   BMI 18.15 kg/m   Patient with chronic pain back and left leg, no nausea or vomiting, on chest pain or dyspnea.   Neurology awake and alert ENT with mild pallor with no icterus Cardiovascular with S1 and S2 present and regular with no gallops Respiratory with no rales or wheezing, no rhonchi  Abdomen with no distention  No lower extremity edema Right knee with  hypertrophy and deformity   Condition at discharge: stable  The results of significant diagnostics from this hospitalization (including imaging, microbiology, ancillary and laboratory) are listed below for reference.   Imaging Studies: CT Head Wo Contrast Result Date: 02/29/2024 EXAM: CT HEAD WITHOUT CONTRAST 02/29/2024 06:15:40 PM TECHNIQUE: CT of the head was performed without the administration of intravenous contrast. Automated exposure control, iterative reconstruction, and/or weight based adjustment of the mA/kV was utilized to reduce the radiation dose to as low as reasonably achievable. COMPARISON: CT of the head dated 07/20/2000. The study is degraded by patient motion. CLINICAL HISTORY: Altered mental status, nontraumatic (Ped 0-17y). Pt home health nurse found pt on porch with pants to ankles. Pt door was locked an seemed as if she may have been outside for 2 days. Pt is dirty, altered, and figety. FINDINGS: BRAIN AND VENTRICLES: No acute hemorrhage. Gray-white differentiation is preserved. No hydrocephalus. No extra-axial  collection. No mass effect or midline shift. ORBITS: No acute abnormality. SINUSES: No acute abnormality. SOFT TISSUES AND SKULL: No acute soft tissue abnormality. No skull fracture. IMPRESSION: 1. No acute intracranial abnormality. 2. Study degraded by patient motion. Electronically signed by: evalene coho 02/29/2024 08:02 PM EDT RP Workstation: [id number]   DG Chest Port 1 View Result Date: 02/29/2024 CLINICAL DATA:  Questionable sepsis - evaluate for abnormality EXAM: PORTABLE CHEST - 1 VIEW COMPARISON:  December 21, 2023 FINDINGS: Lower lung volumes. No focal airspace consolidation, pleural effusion, or pneumothorax. No cardiomegaly. Tortuous aorta. No acute fracture or destructive lesions. Multilevel thoracic osteophytosis. Cervical fusion hardware, partially visualized. IMPRESSION: No acute cardiopulmonary abnormality. Electronically Signed   By: Rogelia Myers M.D.   On: 02/29/2024 19:27    Microbiology: Results for orders placed or performed during the hospital encounter of 02/29/24  Blood Culture (routine x 2)     Status: None   Collection Time: 02/29/24  4:52 PM   Specimen: BLOOD  Result Value Ref Range Status   Specimen Description BLOOD LEFT ANTECUBITAL  Final   Special Requests   Final    BOTTLES DRAWN AEROBIC ONLY Blood Culture results may not be optimal due to an inadequate volume of blood received in culture bottles   Culture   Final    NO GROWTH 5 DAYS Performed at [facility], [address] [place], [place] [address]    Report Status 03/05/2024 FINAL  Final  Blood Culture (routine x 2)     Status: Abnormal (Preliminary result)   Collection Time: 02/29/24  4:58 PM   Specimen: BLOOD  Result Value Ref Range Status   Specimen Description   Final    BLOOD BLOOD RIGHT HAND Performed at [facility], [address] [place], [place] [address]    Special Requests   Final    BOTTLES DRAWN AEROBIC ONLY Blood Culture results may not be optimal due to an inadequate volume of blood  received in culture bottles Performed at [facility], [address], [place], [place] [address]    Culture  Setup Time   Final    AEROBIC BOTTLE ONLY GRAM POSITIVE COCCI GRAM NEGATIVE RODS Gram Stain Report Called to,Read Back By and Verified With: JENNIFER KING @ 1259 ON 03/01/24 C VARNER CRITICAL RESULT CALLED TO, READ BACK BY AND VERIFIED WITH: RN N. JACKSON [id number] @ [phone] FH    Culture (A)  Final    ACINETOBACTER LWOFFII CULTURE REINCUBATED FOR BETTER GROWTH STAPHYLOCOCCUS COHNII THE SIGNIFICANCE OF ISOLATING THIS ORGANISM FROM A SINGLE SET OF BLOOD CULTURES WHEN  MULTIPLE SETS ARE DRAWN IS UNCERTAIN. PLEASE NOTIFY THE MICROBIOLOGY DEPARTMENT WITHIN ONE WEEK IF SPECIATION AND SENSITIVITIES ARE REQUIRED. Performed at [facility], [address], [place], [place] [address]    Report Status PENDING  Incomplete  Blood Culture ID Panel (Reflexed)     Status: Abnormal   Collection Time: 02/29/24  4:58 PM  Result Value Ref Range Status   Enterococcus faecalis NOT DETECTED NOT DETECTED Final   Enterococcus Faecium NOT DETECTED NOT DETECTED Final   Listeria monocytogenes NOT DETECTED NOT DETECTED Final   Staphylococcus species DETECTED (A) NOT DETECTED Final    Comment: CRITICAL RESULT CALLED TO, READ BACK BY AND VERIFIED WITH: RN N. JACKSON [id number] @ 1821 FH    Staphylococcus aureus (BCID) NOT DETECTED NOT DETECTED Final   Staphylococcus epidermidis NOT DETECTED NOT DETECTED Final   Staphylococcus lugdunensis NOT DETECTED NOT DETECTED Final   Streptococcus species NOT DETECTED NOT DETECTED Final   Streptococcus agalactiae NOT DETECTED NOT DETECTED Final   Streptococcus pneumoniae NOT DETECTED NOT DETECTED Final   Streptococcus pyogenes NOT DETECTED NOT DETECTED Final   A.calcoaceticus-baumannii NOT DETECTED NOT DETECTED Final   Bacteroides fragilis NOT DETECTED NOT DETECTED Final   Enterobacterales NOT DETECTED NOT DETECTED Final   Enterobacter cloacae complex NOT DETECTED NOT  DETECTED Final   Escherichia coli NOT DETECTED NOT DETECTED Final   Klebsiella aerogenes NOT DETECTED NOT DETECTED Final   Klebsiella oxytoca NOT DETECTED NOT DETECTED Final   Klebsiella pneumoniae NOT DETECTED NOT DETECTED Final   Proteus species NOT DETECTED NOT DETECTED Final   Salmonella species NOT DETECTED NOT DETECTED Final   Serratia marcescens NOT DETECTED NOT DETECTED Final   Haemophilus influenzae NOT DETECTED NOT DETECTED Final   Neisseria meningitidis NOT DETECTED NOT DETECTED Final   Pseudomonas aeruginosa NOT DETECTED NOT DETECTED Final   Stenotrophomonas maltophilia NOT DETECTED NOT DETECTED Final   Candida albicans NOT DETECTED NOT DETECTED Final   Candida auris NOT DETECTED NOT DETECTED Final   Candida glabrata NOT DETECTED NOT DETECTED Final   Candida krusei NOT DETECTED NOT DETECTED Final   Candida parapsilosis NOT DETECTED NOT DETECTED Final   Candida tropicalis NOT DETECTED NOT DETECTED Final   Cryptococcus neoformans/gattii NOT DETECTED NOT DETECTED Final    Comment: Performed at [facility], [address], [place], [place] [address]  Resp panel by RT-PCR (RSV, Flu A&B, Covid) Anterior Nasal Swab     Status: None   Collection Time: 02/29/24  5:04 PM   Specimen: Anterior Nasal Swab  Result Value Ref Range Status   SARS Coronavirus 2 by RT PCR NEGATIVE NEGATIVE Final    Comment: (NOTE) SARS-CoV-2 target nucleic acids are NOT DETECTED.  The SARS-CoV-2 RNA is generally detectable in upper respiratory specimens during the acute phase of infection. The lowest concentration of SARS-CoV-2 viral copies this assay can detect is 138 copies/mL. A negative result does not preclude SARS-Cov-2 infection and should not be used as the sole basis for treatment or other patient management decisions. A negative result may occur with  improper specimen collection/handling, submission of specimen other than nasopharyngeal swab, presence of viral mutation(s) within  the areas targeted by this assay, and inadequate number of viral copies(<138 copies/mL). A negative result must be combined with clinical observations, patient history, and epidemiological information. The expected result is Negative.  Fact Sheet for Patients:  BloggerCourse.com  Fact Sheet for Healthcare Providers:  SeriousBroker.it  This test is no t yet approved or cleared  by the [place]  FDA and  has been authorized for detection and/or diagnosis of SARS-CoV-2 by FDA under an Emergency Use Authorization (EUA). This EUA will remain  in effect (meaning this test can be used) for the duration of the COVID-19 declaration under Section 564(b)(1) of the Act, 21 U.S.C.section 360bbb-3(b)(1), unless the authorization is terminated  or revoked sooner.       Influenza A by PCR NEGATIVE NEGATIVE Final   Influenza B by PCR NEGATIVE NEGATIVE Final    Comment: (NOTE) The Xpert Xpress SARS-CoV-2/FLU/RSV plus assay is intended as an aid in the diagnosis of influenza from Nasopharyngeal swab specimens and should not be used as a sole basis for treatment. Nasal washings and aspirates are unacceptable for Xpert Xpress SARS-CoV-2/FLU/RSV testing.  Fact Sheet for Patients: BloggerCourse.com  Fact Sheet for Healthcare Providers: SeriousBroker.it  This test is not yet approved or cleared by the [place]  FDA and has been authorized for detection and/or diagnosis of SARS-CoV-2 by FDA under an Emergency Use Authorization (EUA). This EUA will remain in effect (meaning this test can be used) for the duration of the COVID-19 declaration under Section 564(b)(1) of the Act, 21 U.S.C. section 360bbb-3(b)(1), unless the authorization is terminated or revoked.     Resp Syncytial Virus by PCR NEGATIVE NEGATIVE Final    Comment: (NOTE) Fact Sheet for  Patients: BloggerCourse.com  Fact Sheet for Healthcare Providers: SeriousBroker.it  This test is not yet approved or cleared by the [place]  FDA and has been authorized for detection and/or diagnosis of SARS-CoV-2 by FDA under an Emergency Use Authorization (EUA). This EUA will remain in effect (meaning this test can be used) for the duration of the COVID-19 declaration under Section 564(b)(1) of the Act, 21 U.S.C. section 360bbb-3(b)(1), unless the authorization is terminated or revoked.  Performed at [facility], [address], [place], [place] [address]   Urine Culture     Status: Abnormal   Collection Time: 02/29/24  5:15 PM   Specimen: Urine, Random  Result Value Ref Range Status   Specimen Description   Final    URINE, RANDOM Performed at [facility], [address], [place], [place] [address]    Special Requests   Final    NONE Reflexed from [phone] Performed at [facility], [address] [place], [place] [address]    Culture >=100,000 COLONIES/mL ESCHERICHIA COLI (A)  Final   Report Status 03/03/2024 FINAL  Final   Organism ID, Bacteria ESCHERICHIA COLI (A)  Final      Susceptibility   Escherichia coli - MIC*    AMPICILLIN <=2 SENSITIVE Sensitive     CEFEPIME <=0.12 SENSITIVE Sensitive     ERTAPENEM <=0.12 SENSITIVE Sensitive     CEFTRIAXONE  <=0.25 SENSITIVE Sensitive     CIPROFLOXACIN <=0.06 SENSITIVE Sensitive     GENTAMICIN <=1 SENSITIVE Sensitive     NITROFURANTOIN <=16 SENSITIVE Sensitive     TRIMETH/SULFA <=20 SENSITIVE Sensitive     AMPICILLIN/SULBACTAM <=2 SENSITIVE Sensitive     PIP/TAZO Value in next row Sensitive ug/mL     <=4 SENSITIVEThis is a modified FDA-approved test that has been validated and its performance characteristics determined by the reporting laboratory.  This laboratory is certified under the Clinical Laboratory Improvement Amendments CLIA as qualified to perform high complexity  clinical laboratory testing.    MEROPENEM Value in next row Sensitive      <=4 SENSITIVEThis is a modified FDA-approved test that has been validated and its performance characteristics determined by  the reporting laboratory.  This laboratory is certified under the Clinical Laboratory Improvement Amendments CLIA as qualified to perform high complexity clinical laboratory testing.    CEFAZOLIN  (URINE) Value in next row Sensitive      <=1 SENSITIVEThis is a modified FDA-approved test that has been validated and its performance characteristics determined by the reporting laboratory.  This laboratory is certified under the Clinical Laboratory Improvement Amendments CLIA as qualified to perform high complexity clinical laboratory testing.    * >=100,000 COLONIES/mL ESCHERICHIA COLI  MRSA Next Gen by PCR, Nasal     Status: None   Collection Time: 02/29/24  7:48 PM   Specimen: Nasal Mucosa; Nasal Swab  Result Value Ref Range Status   MRSA by PCR Next Gen NOT DETECTED NOT DETECTED Final    Comment: (NOTE) The GeneXpert MRSA Assay (FDA approved for NASAL specimens only), is one component of a comprehensive MRSA colonization surveillance program. It is not intended to diagnose MRSA infection nor to guide or monitor treatment for MRSA infections. Test performance is not FDA approved in patients less than 33 years old. Performed at [facility], [address] [place], [place] [address]     Labs: CBC: Recent Labs  Lab 02/29/24 1649 03/01/24 0512 03/02/24 0417 03/04/24 0346  WBC 19.7* 18.9* 11.7* 7.4  NEUTROABS 15.6*  --   --   --   HGB 7.6* 8.4* 7.9* 9.1*  HCT 24.0* 25.9* 24.3* 28.4*  MCV 89.9 88.1 90.0 89.9  PLT 432* 439* 396 409*   Basic Metabolic Panel: Recent Labs  Lab 02/29/24 1649 03/01/24 0512 03/02/24 0417 03/03/24 0344 03/04/24 0346  NA 143 145 139 136 135  K 3.1* 2.9* 3.6 4.1 4.0  CL 112* 111 110 102 101  CO2 12* 19* 23 25 25   GLUCOSE 55* 88 109* 106* 96  BUN 62* 42* 18  11 10   CREATININE 2.07* 1.07* 0.52 0.49 0.42*  CALCIUM  7.4* 8.1* 8.3* 8.4* 8.7*  MG  --  2.1 1.8 1.8  --   PHOS  --   --   --  2.3*  --    Liver Function Tests: Recent Labs  Lab 02/29/24 1649 03/01/24 0512 03/02/24 0417 03/03/24 0344  AST 123* 82* 41  --   ALT 51* 45* 35  --   ALKPHOS 78 71 62  --   BILITOT 1.7* 1.7* 0.2  --   PROT 5.7* 5.7* 5.2*  --   ALBUMIN  3.2* 3.2* 2.7* 2.9*   CBG: Recent Labs  Lab 02/29/24 2020  GLUCAP 117*    Discharge time spent: greater than 30 minutes.  Signed: Elidia Toribio Furnace, MD [facility] 03/05/2024

## 2024-03-05 NOTE — Progress Notes (Signed)
 Mobility Specialist Progress Note:    03/05/24 0919  Mobility  Activity Ambulated with assistance  Level of Assistance Standby assist, set-up cues, supervision of patient - no hands on  Assistive Device Front wheel walker  Distance Ambulated (ft) 60 ft  Range of Motion/Exercises Active;All extremities  Activity Response Tolerated well  Mobility Referral Yes  Mobility visit 1 Mobility  Mobility Specialist Start Time (ACUTE ONLY) 0900  Mobility Specialist Stop Time (ACUTE ONLY) 0919  Mobility Specialist Time Calculation (min) (ACUTE ONLY) 19 min   Pt received sitting EOB, agreeable to mobility. Required SBA to stand and ambulate with RW. Tolerated well, c/o dizziness near EOS. VSS, resolved with rest. Returned sitting EOB, MD at bedside. All needs met.    Sherrilee Ditty Mobility Specialist Please contact via Special educational needs teacher or  Rehab office at [phone]

## 2024-03-05 NOTE — Evaluation (Signed)
 Occupational Therapy Evaluation Patient Details Name: Allison Berry MRN: [id number] DOB: 1966/04/19 Today's Date: 03/05/2024   History of Present Illness   Allison Berry is a 58 y.o. female with medical history significant for hypertension, hyperlipidemia, chronic pain, depression, and anxiety who is brought in with altered mental status after being locked out of her house and remaining outside for the past 2 days.     Patient cannot be reached by phone and EMS was asked to do a welfare check.  They found the patient outside of her house, soaking wet, apparently locked out.  She is confused and had systolic blood pressure in the 80s.  She is not providing much meaningful history in the ED. (per MD)     Clinical Impressions Pt agreeable to OT evaluation. Pt reportedly uses a RW outside and lean on walls inside. Today put used RW for mobility with mod I to supervision assist to ambulate to the toilet and back to chair. No physical assist needed today but pt does have a significant history of falling and general weakness. Pt left in the chair with call bell within reach. Pt will benefit from continued OT in the hospital and recommended venue below to increase strength, balance, and endurance for safe ADL's.        If plan is discharge home, recommend the following:   A little help with walking and/or transfers;Help with stairs or ramp for entrance     Functional Status Assessment   Patient has had a recent decline in their functional status and demonstrates the ability to make significant improvements in function in a reasonable and predictable amount of time.     Equipment Recommendations   BSC/3in1 (If pt ends up going home.)            Precautions/Restrictions   Precautions Precautions: Fall Recall of Precautions/Restrictions: Intact Restrictions Weight Bearing Restrictions Per Provider Order: No     Mobility Bed Mobility Overal bed mobility: Modified  Independent             General bed mobility comments: sit to supine and supine to sit    Transfers Overall transfer level: Needs assistance Equipment used: Rolling walker (2 wheels) Transfers: Sit to/from Stand, Bed to chair/wheelchair/BSC Sit to Stand: Modified independent (Device/Increase time), Supervision     Step pivot transfers: Modified independent (Device/Increase time), Supervision     General transfer comment: EOB to chair with RW.      Balance Overall balance assessment: Needs assistance Sitting-balance support: No upper extremity supported, Feet supported Sitting balance-Leahy Scale: Good Sitting balance - Comments: seated at EOB   Standing balance support: During functional activity, Bilateral upper extremity supported Standing balance-Leahy Scale: Fair Standing balance comment: fair with RW                           ADL either performed or assessed with clinical judgement   ADL Overall ADL's : Needs assistance/impaired     Grooming: Supervision/safety;Modified independent;Standing       Lower Body Bathing: Modified independent;Supervison/ safety;Sitting/lateral leans       Lower Body Dressing: Modified independent;Supervision/safety;Sitting/lateral leans Lower Body Dressing Details (indicate cue type and reason): Able to doff and don sock without physical assist. Toilet Transfer: Modified Independent;Supervision/safety;Rolling walker (2 wheels);Ambulation Toilet Transfer Details (indicate cue type and reason): Pt able to ambulate to toilet from EOB with RW. Toileting- Clothing Manipulation and Hygiene: Supervision/safety;Modified independent;Sitting/lateral lean;Sit to/from stand  Functional mobility during ADLs: Modified independent;Supervision/safety;Rolling walker (2 wheels)       Vision Baseline Vision/History: 1 Wears glasses Ability to See in Adequate Light: 1 Impaired Patient Visual Report: No change from baseline (Pt  reports her glasses broke.) Vision Assessment?:  (Baseline impairment without glassess that the pt reports needing.)     Perception Perception: Not tested       Praxis Praxis: Not tested       Pertinent Vitals/Pain Pain Assessment Pain Assessment: 0-10 Pain Score: 8  Pain Location: L hip and leg, back Pain Descriptors / Indicators: Aching, Constant Pain Intervention(s): Limited activity within patient's tolerance, Monitored during session, Repositioned     Extremity/Trunk Assessment Upper Extremity Assessment Upper Extremity Assessment: Generalized weakness   Lower Extremity Assessment Lower Extremity Assessment: Defer to PT evaluation   Cervical / Trunk Assessment Cervical / Trunk Assessment: Kyphotic   Communication Communication Communication: No apparent difficulties   Cognition Arousal: Alert Behavior During Therapy: WFL for tasks assessed/performed Cognition: No apparent impairments                               Following commands: Intact       Cueing  General Comments   Cueing Techniques: Verbal cues                 Home Living Family/patient expects to be discharged to:: Private residence Living Arrangements: Alone Available Help at Discharge: Family;Available PRN/intermittently Type of Home: House Home Access: Stairs to enter [employer] of Steps: 4 Entrance Stairs-Rails: Right;Left Home Layout: One level     Bathroom Shower/Tub: Chief Strategy Officer: Standard Bathroom Accessibility: Yes   Home Equipment: Agricultural consultant (2 wheels);Cane - single point;Shower seat;Toilet riser   Additional Comments: Pt reports she does not have a BSC but would like one.      Prior Functioning/Environment Prior Level of Function : Independent/Modified Independent;History of Falls (last six months) (Pt reports over 10 falls in the past 6 months.)             Mobility Comments: household ambulation leaning on  furniture and walls, uses RW for longer distances out side, has not been driving lately (per chart) ADLs Comments: Independent, orders groceries. Reports that taking care of herself is difficult.    OT Problem List: Decreased strength;Decreased activity tolerance;Impaired balance (sitting and/or standing)   OT Treatment/Interventions: Self-care/ADL training;Therapeutic exercise;Therapeutic activities;Patient/family education;Balance training      OT Goals(Current goals can be found in the care plan section)   Acute Rehab OT Goals Patient Stated Goal: improve strength OT Goal Formulation: With patient Time For Goal Achievement: 03/19/24 Potential to Achieve Goals: Good   OT Frequency:  Min 1X/week                                   End of Session Equipment Utilized During Treatment: Rolling walker (2 wheels)  Activity Tolerance: Patient tolerated treatment well Patient left: in chair  OT Visit Diagnosis: Unsteadiness on feet (R26.81);Other abnormalities of gait and mobility (R26.89);Muscle weakness (generalized) (M62.81);History of falling (Z91.81)                Time: 9064-9049 OT Time Calculation (min): 15 min Charges:  OT General Charges $OT Visit: 1 Visit OT Evaluation $OT Eval Low Complexity: 1 Low  Aireanna Luellen OT, MOT  [employer]  03/05/2024, 11:48 AM

## 2024-03-05 NOTE — Assessment & Plan Note (Signed)
 Antihypertensive medications were held during her hospitalization.  At the time of discharge will resume lisinopril , hold on hydrochlorothiazide  for now.  Follow up as outpatient.

## 2024-03-05 NOTE — TOC Progression Note (Addendum)
 Transition of Care [facility]) -DME NOTE   Patient Details  Name: Allison Berry MRN: [id number] Date of Birth: 1965/08/26  [facility]) CM/SW Contact  Sharlyne Stabs, RN Phone Number: 03/05/2024, 10:17 AM   The beneficiary is confined to one level of the home environment and there is no toilet on that level, MD ordering 3N1 as patient will need due to weakness.   Expected Discharge Plan: Home/Self Care Barriers to Discharge: Continued Medical Work up

## 2024-03-05 NOTE — TOC Progression Note (Signed)
 Transition of Care [facility]) - Progression Note    Patient Details  Name: Allison Berry MRN: [id number] Date of Birth: 1966/01/10  Transition of Care [facility]) CM/SW Contact  Sharlyne Stabs, RN Phone Number: 03/05/2024, 4:08 PM  Clinical Narrative:   TOC has been following all day. CM spoke with her son this morning, the plan was made with her sister to discharge home. Cm spoke to patient she ask me to call her sister. Jon is not agreeing to go and stay with her. She is older and unable to stay away from her home. CM went to bedside. Per patient her other sister, Devere is having surgery next week. We reviewed the two SNF offers. She is not agreeable due to them being one star.  She will talk to her family tonight and feels like one more night she will be stronger and can go home tomorrow. Per patient she spoke with MD. Avoidable day added. TOC following.     Expected Discharge Plan: Home/Self Care Barriers to Discharge: Family Issues    Expected Discharge Plan and Services In-house Referral: Clinical Social Work Discharge Planning Services: NA Post Acute Care Choice: NA Living arrangements for the past 2 months: Mobile Home Expected Discharge Date: 03/05/24               DME Arranged: N/A DME Agency: NA      Social Drivers of Health (SDOH) Interventions SDOH Screenings   Food Insecurity: Patient Unable To Answer (02/29/2024)  Housing: Patient Unable To Answer (02/29/2024)  Transportation Needs: Patient Unable To Answer (02/29/2024)  Utilities: Patient Unable To Answer (02/29/2024)  Financial Resource Strain: Low Risk  (06/07/2022)   Received from [facility]  Physical Activity: Unknown (10/06/2021)   Received from [facility]  Social Connections: Somewhat Isolated (10/06/2021)   Received from [facility]  Stress: Stress Concern Present (06/07/2022)   Received from [facility]  Tobacco Use: High Risk (02/29/2024)    Readmission Risk Interventions    03/01/2024   10:34  AM  Readmission Risk Prevention Plan  Post Dischage Appt Complete  Medication Screening Complete  Transportation Screening Complete

## 2024-03-06 ENCOUNTER — Inpatient Hospital Stay (HOSPITAL_COMMUNITY)

## 2024-03-06 DIAGNOSIS — A419 Sepsis, unspecified organism: Secondary | ICD-10-CM | POA: Diagnosis not present

## 2024-03-06 DIAGNOSIS — N390 Urinary tract infection, site not specified: Secondary | ICD-10-CM | POA: Diagnosis not present

## 2024-03-06 NOTE — Plan of Care (Signed)

## 2024-03-06 NOTE — Plan of Care (Signed)
   Problem: Education: Goal: Knowledge of General Education information will improve Description Including pain rating scale, medication(s)/side effects and non-pharmacologic comfort measures Outcome: Adequate for Discharge   Problem: Health Behavior/Discharge Planning: Goal: Ability to manage health-related needs will improve Outcome: Adequate for Discharge

## 2024-03-06 NOTE — Progress Notes (Signed)
 Mobility Specialist Progress Note:    03/06/24 1141  Mobility  Activity Ambulated with assistance  Level of Assistance Standby assist, set-up cues, supervision of patient - no hands on  Assistive Device Front wheel walker  Distance Ambulated (ft) 70 ft  Range of Motion/Exercises Active;All extremities  Activity Response Tolerated well  Mobility Referral Yes  Mobility visit 1 Mobility  Mobility Specialist Start Time (ACUTE ONLY) 1110  Mobility Specialist Stop Time (ACUTE ONLY) 1128  Mobility Specialist Time Calculation (min) (ACUTE ONLY) 18 min   Pt received in bed, agreeable to mobility. Required supervision to stand and ambulate with RW. Tolerated well, c/o left hip pain. Returned to room, all needs met.   Sherrilee Ditty Mobility Specialist Please contact via Special educational needs teacher or  Rehab office at [phone]

## 2024-03-06 NOTE — TOC Transition Note (Signed)
 Transition of Care [facility]) - Discharge Note   Patient Details  Name: Allison Berry MRN: [id number] Date of Birth: 08-Aug-1965  [facility] of [facility]) CM/SW Contact:  Sharlyne Stabs, RN Phone Number: 03/06/2024, 12:55 PM   Clinical Narrative:   CM at the bedside with bed offer from [facility]. Patient is on the phone with her brother. She wants to go home. Either her brother will pick her up or her son after work.  Brandi from [facility] stopped by and cleared her to make her own decisions. Brandi and team update with the DC plan to go home.     Final next level of care: Home/Self Care Barriers to Discharge: Barriers Resolved   Patient Goals and CMS Choice Patient states their goals for this hospitalization and ongoing recovery are:: To return home CMS Medicare.gov Compare Post Acute Care list provided to:: Patient Choice offered to / list presented to : Patient      Discharge Placement                    Patient and family notified of of transfer: 03/06/24  Discharge Plan and Services Additional resources added to the After Visit Summary for   In-house Referral: Clinical Social Work Discharge Planning Services: NA Post Acute Care Choice: NA          DME Arranged: N/A DME Agency: NA     Social Drivers of Health (SDOH) Interventions SDOH Screenings   Food Insecurity: Patient Unable To Answer (02/29/2024)  Housing: Patient Unable To Answer (02/29/2024)  Transportation Needs: Patient Unable To Answer (02/29/2024)  Utilities: Patient Unable To Answer (02/29/2024)  Financial Resource Strain: Low Risk  (06/07/2022)   Received from [facility]  Physical Activity: Unknown (10/06/2021)   Received from [facility]  Social Connections: Somewhat Isolated (10/06/2021)   [facility]  Stress: Stress Concern Present (06/07/2022)   Received from [facility]  Tobacco Use: High Risk (02/29/2024)     Readmission Risk Interventions    03/01/2024   10:34 AM   Readmission Risk Prevention Plan  Post Dischage Appt Complete  Medication Screening Complete  Transportation Screening Complete

## 2024-03-06 NOTE — Discharge Summary (Signed)
 Physician Discharge Summary   Patient: Allison Berry MRN: [id number] DOB: 1965/10/05  Admit date:     02/29/2024  Discharge date: 03/06/24  Discharge Physician: Bernardino KATHEE Come   PCP: System, Provider Not In   Recommendations at discharge:    Patient will continue antibiotic therapy with cephalexin  to complete on 03/07/24 Continue blood pressure control with lisinopril , hold on hydrochlorothiazide  due to risk of hypotension and electrolyte abnormalities.  Follow up with primary care in 7 to 10 days Follow up with pain clinic Adult protective services will follow up as outpatient    Discharge Diagnoses: Principal Problem:   Sepsis secondary to UTI [facility]) Active Problems:   AKI (acute kidney injury) (HCC)   Essential hypertension   Chronic back pain   Normocytic anemia   Depression with anxiety  Resolved Problems:   * No resolved hospital problems. [facility] Course: Allison Berry was admitted to the hospital with the working diagnosis of complicated urinary tract infection, severe sepsis with septic shock.   58 yo female with the past medical history of hypertension, hyperlipidemia, depression, chronic pain syndrome and anxiety who presented with altered mental status.  Apparently she was locked out of her home and spent 2 days outside. EMS did a welfare check and found her confused and hypotensive.  On her initial physical examination her blood pressure 126/97, HR 101, RR 24 and 02 saturation 92%. She was awake and alerted to person, lungs with no wheezing or rhonchi, heart with S1 and S2 present and regular, abdomen with no distention and no lower extremity edema.   Na 143, K 3.1 CL 112 bicarbonate 12, glucose 55 bun 62 cr 2,0  AST 123 ALT 51  T bilirubin 1,7  Wbc 19,7 hgb 7,6 plt 432  Lactic acid 0,7  Alcohol  < 15  Sars covid 19 negative Influenza negative RSV negative   Urine analysis SG 1,014, protein 30, positive nitrites, large leukocytes, moderate hgb, wbc > 50, rbc  > 50  Toxicology positive for opiates.   CT head with no acute intracranial abnormalities.   Chest radiograph with right rotation, hypoinflation with no cardiomegaly, infiltrates or effusions.  EKG 101 bpm, normal axis, normal intervals, qtc 437, sinus rhythm with no significant ST segment or T wave changes.   Developed hypotension, refractive to IV fluids and required vasopressors.   08/14 off pressors, clinically improving.  08/16 back on home regiment of analgesics. Plan for SNF. Changed to po antibiotics. 08/17 pending transfer to SNF.  08/18 patient declined SNF and has decided to go home with home health services.  Complete oral antibiotic therapy.   Assessment and Plan: * Sepsis secondary to UTI (HCC) Septic shock, with hypotension and acute metabolic encephalopathy. Present on admission. Resolved.   Urine culture positive for E coli > 100.000 CFU poly sensitive  Blood culture only one bottle staphylococcus (contamination)    Wbc is 7,4 with resolution of leukocytosis.    Antibiotic to oral cephalexin , to complete 03/07/24  Acute encephalopathy has resolved.   AKI (acute kidney injury) (HCC) Rhabdomyolysis. Hypokalemia. Resolved.   Renal function stable with serum cr at 0,42, K is 4.0 and serum bicarbonate at 25  Na 135 and Mg 1.8   Essential hypertension Antihypertensive medications were held during her hospitalization.  At the time of discharge will resume lisinopril , hold on hydrochlorothiazide  for now.  Follow up as outpatient.   Chronic back pain Patient follows with pain clinic.  Plan to continue oxycodone  per her home regimen  of 15 mg IR q 4 hrs prn and  gabapentin  800 mg tid Continue with lamotrigine , clonazepam , and acetaminophen .   Duloxetine  60 mg  Continue with topical diclofenac  to right knee.   Left hip and knee pain: Acute on chronic, Exam very reassuring and left hip XR shows no complication of hip s/p remote arthroplasty.   Normocytic  anemia Follow up hgb 9.1  Iron  panel with serum iron  89, TOBC 230, transferrin saturation 39 and ferritin 293, consistent with anemia of chronic disease.  No signs of active bleeding   Depression with anxiety Continue clonazepam  as needed.  Start vilazadone as outpatient.        Consultants: none  Procedures performed: none   Disposition: Home Diet recommendation:  Regular diet DISCHARGE MEDICATION: Allergies as of 03/06/2024   No Known Allergies      Medication List     STOP taking these medications    lisinopril -hydrochlorothiazide  10-12.5 MG tablet Commonly known as: ZESTORETIC    methocarbamol  500 MG tablet Commonly known as: ROBAXIN        TAKE these medications    alendronate 70 MG tablet Commonly known as: FOSAMAX Take 70 mg by mouth once a week.   cephALEXin  500 MG capsule Commonly known as: KEFLEX  Take 1 capsule (500 mg total) by mouth every 12 (twelve) hours for 2 days.   clonazePAM  1 MG tablet Commonly known as: KLONOPIN  Take 1 mg by mouth 2 (two) times daily as needed.   cyclobenzaprine 10 MG tablet Commonly known as: FLEXERIL Take 10 mg by mouth 3 (three) times daily as needed for muscle spasms.   diclofenac  Sodium 1 % Gel Commonly known as: VOLTAREN  Apply 2 g topically in the morning and at bedtime. Apply to right knee   docusate sodium  100 MG capsule Commonly known as: COLACE Take 1 capsule (100 mg total) by mouth 2 (two) times daily.   DULoxetine  60 MG capsule Commonly known as: CYMBALTA  Take 60 mg by mouth daily.   feeding supplement Liqd Take 237 mLs by mouth 2 (two) times daily between meals.   gabapentin  800 MG tablet Commonly known as: NEURONTIN  Take 800 mg by mouth 3 (three) times daily.   ibuprofen 800 MG tablet Commonly known as: ADVIL Take 200 mg by mouth every 8 (eight) hours as needed for mild pain, headache or fever.   lamoTRIgine  100 MG tablet Commonly known as: LAMICTAL  Take 100 mg by mouth at bedtime.    lisinopril  10 MG tablet Commonly known as: ZESTRIL  Take 1 tablet (10 mg total) by mouth daily.   naloxone 4 MG/0.1ML Liqd nasal spray kit Commonly known as: NARCAN Place 0.4 mg into the nose once.   oxyCODONE  15 MG immediate release tablet Commonly known as: ROXICODONE  Take 15 mg by mouth 4 (four) times daily as needed.   Vilazodone HCl 20 MG Tabs Take 1 tablet by mouth every morning.        Discharge Exam: Filed Weights   02/29/24 1654 02/29/24 2100  Weight: 54.4 kg 51 kg   BP 134/79 (BP Location: Right Arm)   Pulse 93   Temp 98.1 F (36.7 C) (Oral)   Resp 16   Ht 5' 6 (1.676 m)   Wt 51 kg   SpO2 96%   BMI 18.15 kg/m   Left knee nontender, no effusion, no visible or palpable deformity. Right knee with significant bony hypertrophy and effusion, no point tenderness. Left hip full ROM.  Alert, oriented, no focal deficits.    Condition  at discharge: stable  The results of significant diagnostics from this hospitalization (including imaging, microbiology, ancillary and laboratory) are listed below for reference.   Imaging Studies: DG HIP UNILAT WITH PELVIS 2-3 VIEWS LEFT Result Date: 03/06/2024 CLINICAL DATA:  Left hip pain.  History of left hip arthroplasty. EXAM: DG HIP (WITH OR WITHOUT PELVIS) 2-3V LEFT COMPARISON:  Pelvis CT 12/20/2023 FINDINGS: Left hip arthroplasty in expected alignment. No periprosthetic lucency. No periprosthetic or acute pelvic fracture. Pubic rami are intact. Remote right inferior ramus fracture, unchanged. Intact bony pelvis. The bones are subjectively under mineralized. Stable sclerotic density projecting over the left iliac bone. IMPRESSION: Left hip arthroplasty without complication or acute abnormality. Electronically Signed   By: Andrea Gasman M.D.   On: 03/06/2024 11:57   CT Head Wo Contrast Result Date: 02/29/2024 EXAM: CT HEAD WITHOUT CONTRAST 02/29/2024 06:15:40 PM TECHNIQUE: CT of the head was performed without the administration  of intravenous contrast. Automated exposure control, iterative reconstruction, and/or weight based adjustment of the mA/kV was utilized to reduce the radiation dose to as low as reasonably achievable. COMPARISON: CT of the head dated 07/20/2000. The study is degraded by patient motion. CLINICAL HISTORY: Altered mental status, nontraumatic (Ped 0-17y). Pt home health nurse found pt on porch with pants to ankles. Pt door was locked an seemed as if she may have been outside for 2 days. Pt is dirty, altered, and figety. FINDINGS: BRAIN AND VENTRICLES: No acute hemorrhage. Gray-white differentiation is preserved. No hydrocephalus. No extra-axial collection. No mass effect or midline shift. ORBITS: No acute abnormality. SINUSES: No acute abnormality. SOFT TISSUES AND SKULL: No acute soft tissue abnormality. No skull fracture. IMPRESSION: 1. No acute intracranial abnormality. 2. Study degraded by patient motion. Electronically signed by: evalene coho 02/29/2024 08:02 PM EDT RP Workstation: [id number]   DG Chest Port 1 View Result Date: 02/29/2024 CLINICAL DATA:  Questionable sepsis - evaluate for abnormality EXAM: PORTABLE CHEST - 1 VIEW COMPARISON:  December 21, 2023 FINDINGS: Lower lung volumes. No focal airspace consolidation, pleural effusion, or pneumothorax. No cardiomegaly. Tortuous aorta. No acute fracture or destructive lesions. Multilevel thoracic osteophytosis. Cervical fusion hardware, partially visualized. IMPRESSION: No acute cardiopulmonary abnormality. Electronically Signed   By: Rogelia Myers M.D.   On: 02/29/2024 19:27    Microbiology: Results for orders placed or performed during the hospital encounter of 02/29/24  Blood Culture (routine x 2)     Status: None   Collection Time: 02/29/24  4:52 PM   Specimen: BLOOD  Result Value Ref Range Status   Specimen Description BLOOD LEFT ANTECUBITAL  Final   Special Requests   Final    BOTTLES DRAWN AEROBIC ONLY Blood Culture results may not be  optimal due to an inadequate volume of blood received in culture bottles   Culture   Final    NO GROWTH 5 DAYS Performed at [facility], [address], [place], [place] [address]    Report Status 03/05/2024 FINAL  Final  Blood Culture (routine x 2)     Status: Abnormal (Preliminary result)   Collection Time: 02/29/24  4:58 PM   Specimen: BLOOD  Result Value Ref Range Status   Specimen Description   Final    BLOOD BLOOD RIGHT HAND Performed at [facility], [address] [place], [place] [address]    Special Requests   Final    BOTTLES DRAWN AEROBIC ONLY Blood Culture results may not be optimal due to an inadequate volume of blood received in culture bottles Performed  at [facility], [address], [place], [place] [address]    Culture  Setup Time   Final    AEROBIC BOTTLE ONLY GRAM POSITIVE COCCI GRAM NEGATIVE RODS Gram Stain Report Called to,Read Back By and Verified With: JENNIFER KING @ 1259 ON 03/01/24 C VARNER CRITICAL RESULT CALLED TO, READ BACK BY AND VERIFIED WITH: RN N. JACKSON [id number] @ [id number] FH    Culture (A)  Final    ACINETOBACTER LWOFFII SUSCEPTIBILITIES TO FOLLOW STAPHYLOCOCCUS COHNII THE SIGNIFICANCE OF ISOLATING THIS ORGANISM FROM A SINGLE SET OF BLOOD CULTURES WHEN MULTIPLE SETS ARE DRAWN IS UNCERTAIN. PLEASE NOTIFY THE MICROBIOLOGY DEPARTMENT WITHIN ONE WEEK IF SPECIATION AND SENSITIVITIES ARE REQUIRED. Performed at [facility], [address], [place], [place] [address]    Report Status PENDING  Incomplete  Blood Culture ID Panel (Reflexed)     Status: Abnormal   Collection Time: 02/29/24  4:58 PM  Result Value Ref Range Status   Enterococcus faecalis NOT DETECTED NOT DETECTED Final   Enterococcus Faecium NOT DETECTED NOT DETECTED Final   Listeria monocytogenes NOT DETECTED NOT DETECTED Final   Staphylococcus species DETECTED (A) NOT DETECTED Final    Comment: CRITICAL RESULT CALLED TO, READ BACK BY AND VERIFIED WITH: RN N. JACKSON [id number] @ 1821  FH    Staphylococcus aureus (BCID) NOT DETECTED NOT DETECTED Final   Staphylococcus epidermidis NOT DETECTED NOT DETECTED Final   Staphylococcus lugdunensis NOT DETECTED NOT DETECTED Final   Streptococcus species NOT DETECTED NOT DETECTED Final   Streptococcus agalactiae NOT DETECTED NOT DETECTED Final   Streptococcus pneumoniae NOT DETECTED NOT DETECTED Final   Streptococcus pyogenes NOT DETECTED NOT DETECTED Final   A.calcoaceticus-baumannii NOT DETECTED NOT DETECTED Final   Bacteroides fragilis NOT DETECTED NOT DETECTED Final   Enterobacterales NOT DETECTED NOT DETECTED Final   Enterobacter cloacae complex NOT DETECTED NOT DETECTED Final   Escherichia coli NOT DETECTED NOT DETECTED Final   Klebsiella aerogenes NOT DETECTED NOT DETECTED Final   Klebsiella oxytoca NOT DETECTED NOT DETECTED Final   Klebsiella pneumoniae NOT DETECTED NOT DETECTED Final   Proteus species NOT DETECTED NOT DETECTED Final   Salmonella species NOT DETECTED NOT DETECTED Final   Serratia marcescens NOT DETECTED NOT DETECTED Final   Haemophilus influenzae NOT DETECTED NOT DETECTED Final   Neisseria meningitidis NOT DETECTED NOT DETECTED Final   Pseudomonas aeruginosa NOT DETECTED NOT DETECTED Final   Stenotrophomonas maltophilia NOT DETECTED NOT DETECTED Final   Candida albicans NOT DETECTED NOT DETECTED Final   Candida auris NOT DETECTED NOT DETECTED Final   Candida glabrata NOT DETECTED NOT DETECTED Final   Candida krusei NOT DETECTED NOT DETECTED Final   Candida parapsilosis NOT DETECTED NOT DETECTED Final   Candida tropicalis NOT DETECTED NOT DETECTED Final   Cryptococcus neoformans/gattii NOT DETECTED NOT DETECTED Final    Comment: Performed at [facility], [address], [place], [place] [address]  Resp panel by RT-PCR (RSV, Flu A&B, Covid) Anterior Nasal Swab     Status: None   Collection Time: 02/29/24  5:04 PM   Specimen: Anterior Nasal Swab  Result Value Ref Range Status   SARS  Coronavirus 2 by RT PCR NEGATIVE NEGATIVE Final    Comment: (NOTE) SARS-CoV-2 target nucleic acids are NOT DETECTED.  The SARS-CoV-2 RNA is generally detectable in upper respiratory specimens during the acute phase of infection. The lowest concentration of SARS-CoV-2 viral copies this assay can detect is 138 copies/mL. A negative result does not  preclude SARS-Cov-2 infection and should not be used as the sole basis for treatment or other patient management decisions. A negative result may occur with  improper specimen collection/handling, submission of specimen other than nasopharyngeal swab, presence of viral mutation(s) within the areas targeted by this assay, and inadequate number of viral copies(<138 copies/mL). A negative result must be combined with clinical observations, patient history, and epidemiological information. The expected result is Negative.  Fact Sheet for Patients:  BloggerCourse.com  Fact Sheet for Healthcare Providers:  SeriousBroker.it  This test is no t yet approved or cleared by the [place]  FDA and  has been authorized for detection and/or diagnosis of SARS-CoV-2 by FDA under an Emergency Use Authorization (EUA). This EUA will remain  in effect (meaning this test can be used) for the duration of the COVID-19 declaration under Section 564(b)(1) of the Act, 21 U.S.C.section 360bbb-3(b)(1), unless the authorization is terminated  or revoked sooner.       Influenza A by PCR NEGATIVE NEGATIVE Final   Influenza B by PCR NEGATIVE NEGATIVE Final    Comment: (NOTE) The Xpert Xpress SARS-CoV-2/FLU/RSV plus assay is intended as an aid in the diagnosis of influenza from Nasopharyngeal swab specimens and should not be used as a sole basis for treatment. Nasal washings and aspirates are unacceptable for Xpert Xpress SARS-CoV-2/FLU/RSV testing.  Fact Sheet for  Patients: BloggerCourse.com  Fact Sheet for Healthcare Providers: SeriousBroker.it  This test is not yet approved or cleared by the [place]  FDA and has been authorized for detection and/or diagnosis of SARS-CoV-2 by FDA under an Emergency Use Authorization (EUA). This EUA will remain in effect (meaning this test can be used) for the duration of the COVID-19 declaration under Section 564(b)(1) of the Act, 21 U.S.C. section 360bbb-3(b)(1), unless the authorization is terminated or revoked.     Resp Syncytial Virus by PCR NEGATIVE NEGATIVE Final    Comment: (NOTE) Fact Sheet for Patients: BloggerCourse.com  Fact Sheet for Healthcare Providers: SeriousBroker.it  This test is not yet approved or cleared by the [place]  FDA and has been authorized for detection and/or diagnosis of SARS-CoV-2 by FDA under an Emergency Use Authorization (EUA). This EUA will remain in effect (meaning this test can be used) for the duration of the COVID-19 declaration under Section 564(b)(1) of the Act, 21 U.S.C. section 360bbb-3(b)(1), unless the authorization is terminated or revoked.  Performed at [facility], [address] [place], [place] [address]   Urine Culture     Status: Abnormal   Collection Time: 02/29/24  5:15 PM   Specimen: Urine, Random  Result Value Ref Range Status   Specimen Description   Final    URINE, RANDOM Performed at [facility], [address], [place], [place] [address]    Special Requests   Final    NONE Reflexed from [id number] Performed at [facility], [address], [place], [place] [address]    Culture >=100,000 COLONIES/mL ESCHERICHIA COLI (A)  Final   Report Status 03/03/2024 FINAL  Final   Organism ID, Bacteria ESCHERICHIA COLI (A)  Final      Susceptibility   Escherichia coli - MIC*    AMPICILLIN <=2 SENSITIVE Sensitive     CEFEPIME <=0.12 SENSITIVE  Sensitive     ERTAPENEM <=0.12 SENSITIVE Sensitive     CEFTRIAXONE  <=0.25 SENSITIVE Sensitive     CIPROFLOXACIN <=0.06 SENSITIVE Sensitive     GENTAMICIN <=1 SENSITIVE Sensitive     NITROFURANTOIN <=16 SENSITIVE Sensitive     TRIMETH/SULFA <=  20 SENSITIVE Sensitive     AMPICILLIN/SULBACTAM <=2 SENSITIVE Sensitive     PIP/TAZO Value in next row Sensitive ug/mL     <=4 SENSITIVEThis is a modified FDA-approved test that has been validated and its performance characteristics determined by the reporting laboratory.  This laboratory is certified under the Clinical Laboratory Improvement Amendments CLIA as qualified to perform high complexity clinical laboratory testing.    MEROPENEM Value in next row Sensitive      <=4 SENSITIVEThis is a modified FDA-approved test that has been validated and its performance characteristics determined by the reporting laboratory.  This laboratory is certified under the Clinical Laboratory Improvement Amendments CLIA as qualified to perform high complexity clinical laboratory testing.    CEFAZOLIN  (URINE) Value in next row Sensitive      <=1 SENSITIVEThis is a modified FDA-approved test that has been validated and its performance characteristics determined by the reporting laboratory.  This laboratory is certified under the Clinical Laboratory Improvement Amendments CLIA as qualified to perform high complexity clinical laboratory testing.    * >=100,000 COLONIES/mL ESCHERICHIA COLI  MRSA Next Gen by PCR, Nasal     Status: None   Collection Time: 02/29/24  7:48 PM   Specimen: Nasal Mucosa; Nasal Swab  Result Value Ref Range Status   MRSA by PCR Next Gen NOT DETECTED NOT DETECTED Final    Comment: (NOTE) The GeneXpert MRSA Assay (FDA approved for NASAL specimens only), is one component of a comprehensive MRSA colonization surveillance program. It is not intended to diagnose MRSA infection nor to guide or monitor treatment for MRSA infections. Test performance is not  FDA approved in patients less than 26 years old. Performed at [facility], [address] [place], [place] [address]     Labs: CBC: Recent Labs  Lab 02/29/24 1649 03/01/24 0512 03/02/24 0417 03/04/24 0346  WBC 19.7* 18.9* 11.7* 7.4  NEUTROABS 15.6*  --   --   --   HGB 7.6* 8.4* 7.9* 9.1*  HCT 24.0* 25.9* 24.3* 28.4*  MCV 89.9 88.1 90.0 89.9  PLT 432* 439* 396 409*   Basic Metabolic Panel: Recent Labs  Lab 02/29/24 1649 03/01/24 0512 03/02/24 0417 03/03/24 0344 03/04/24 0346  NA 143 145 139 136 135  K 3.1* 2.9* 3.6 4.1 4.0  CL 112* 111 110 102 101  CO2 12* 19* 23 25 25   GLUCOSE 55* 88 109* 106* 96  BUN 62* 42* 18 11 10   CREATININE 2.07* 1.07* 0.52 0.49 0.42*  CALCIUM  7.4* 8.1* 8.3* 8.4* 8.7*  MG  --  2.1 1.8 1.8  --   PHOS  --   --   --  2.3*  --    Liver Function Tests: Recent Labs  Lab 02/29/24 1649 03/01/24 0512 03/02/24 0417 03/03/24 0344  AST 123* 82* 41  --   ALT 51* 45* 35  --   ALKPHOS 78 71 62  --   BILITOT 1.7* 1.7* 0.2  --   PROT 5.7* 5.7* 5.2*  --   ALBUMIN  3.2* 3.2* 2.7* 2.9*   CBG: Recent Labs  Lab 02/29/24 2020  GLUCAP 117*    Discharge time spent: greater than 30 minutes.  Signed: Bernardino KATHEE Come, MD [facility] 03/06/2024

## 2024-03-08 LAB — CULTURE, BLOOD (ROUTINE X 2)

## 2024-03-15 ENCOUNTER — Other Ambulatory Visit: Payer: Self-pay | Admitting: Internal Medicine

## 2024-03-15 DIAGNOSIS — Z1231 Encounter for screening mammogram for malignant neoplasm of breast: Secondary | ICD-10-CM

## 2024-04-10 ENCOUNTER — Encounter (HOSPITAL_COMMUNITY): Payer: Self-pay

## 2024-04-10 ENCOUNTER — Emergency Department (HOSPITAL_COMMUNITY)

## 2024-04-10 ENCOUNTER — Other Ambulatory Visit: Payer: Self-pay

## 2024-04-10 ENCOUNTER — Observation Stay (HOSPITAL_COMMUNITY)
Admission: EM | Admit: 2024-04-10 | Discharge: 2024-04-12 | Disposition: A | Discharge status: Home / Self Care | Attending: Internal Medicine | Admitting: Internal Medicine

## 2024-04-10 DIAGNOSIS — E871 Hypo-osmolality and hyponatremia: Principal | ICD-10-CM | POA: Insufficient documentation

## 2024-04-10 DIAGNOSIS — I700 Atherosclerosis of aorta: Secondary | ICD-10-CM | POA: Diagnosis not present

## 2024-04-10 DIAGNOSIS — I4581 Long QT syndrome: Secondary | ICD-10-CM | POA: Diagnosis not present

## 2024-04-10 DIAGNOSIS — F32A Depression, unspecified: Secondary | ICD-10-CM | POA: Diagnosis not present

## 2024-04-10 DIAGNOSIS — S32010G Wedge compression fracture of first lumbar vertebra, subsequent encounter for fracture with delayed healing: Secondary | ICD-10-CM

## 2024-04-10 DIAGNOSIS — F17210 Nicotine dependence, cigarettes, uncomplicated: Secondary | ICD-10-CM | POA: Diagnosis not present

## 2024-04-10 DIAGNOSIS — I10 Essential (primary) hypertension: Secondary | ICD-10-CM | POA: Insufficient documentation

## 2024-04-10 DIAGNOSIS — K5730 Diverticulosis of large intestine without perforation or abscess without bleeding: Secondary | ICD-10-CM | POA: Insufficient documentation

## 2024-04-10 DIAGNOSIS — R112 Nausea with vomiting, unspecified: Secondary | ICD-10-CM | POA: Insufficient documentation

## 2024-04-10 DIAGNOSIS — E876 Hypokalemia: Secondary | ICD-10-CM | POA: Diagnosis not present

## 2024-04-10 DIAGNOSIS — S32019A Unspecified fracture of first lumbar vertebra, initial encounter for closed fracture: Secondary | ICD-10-CM | POA: Insufficient documentation

## 2024-04-10 DIAGNOSIS — W19XXXA Unspecified fall, initial encounter: Secondary | ICD-10-CM | POA: Insufficient documentation

## 2024-04-10 DIAGNOSIS — F419 Anxiety disorder, unspecified: Secondary | ICD-10-CM | POA: Diagnosis not present

## 2024-04-10 DIAGNOSIS — M549 Dorsalgia, unspecified: Secondary | ICD-10-CM | POA: Diagnosis present

## 2024-04-10 DIAGNOSIS — M545 Low back pain, unspecified: Secondary | ICD-10-CM

## 2024-04-10 DIAGNOSIS — E860 Dehydration: Secondary | ICD-10-CM | POA: Diagnosis not present

## 2024-04-10 LAB — URINALYSIS, ROUTINE W REFLEX MICROSCOPIC
Bacteria, UA: NONE SEEN
Bilirubin Urine: NEGATIVE
Glucose, UA: NEGATIVE mg/dL
Hgb urine dipstick: NEGATIVE
Ketones, ur: NEGATIVE mg/dL
Leukocytes,Ua: NEGATIVE
Nitrite: NEGATIVE
Protein, ur: 100 mg/dL — AB
Specific Gravity, Urine: 1.008 (ref 1.005–1.030)
pH: 6 (ref 5.0–8.0)

## 2024-04-10 LAB — COMPREHENSIVE METABOLIC PANEL WITH GFR
ALT: 11 U/L (ref 0–44)
AST: 18 U/L (ref 15–41)
Albumin: 4.1 g/dL (ref 3.5–5.0)
Alkaline Phosphatase: 105 U/L (ref 38–126)
Anion gap: 15 (ref 5–15)
BUN: 6 mg/dL (ref 6–20)
CO2: 19 mmol/L — ABNORMAL LOW (ref 22–32)
Calcium: 8.9 mg/dL (ref 8.9–10.3)
Chloride: 87 mmol/L — ABNORMAL LOW (ref 98–111)
Creatinine, Ser: 0.47 mg/dL (ref 0.44–1.00)
GFR, Estimated: >60 mL/min (ref >60)
Glucose, Bld: 110 mg/dL — ABNORMAL HIGH (ref 70–99)
Potassium: 2.8 mmol/L — ABNORMAL LOW (ref 3.5–5.1)
Sodium: 121 mmol/L — ABNORMAL LOW (ref 135–145)
Total Bilirubin: 0.7 mg/dL (ref 0.0–1.2)
Total Protein: 8 g/dL (ref 6.5–8.1)

## 2024-04-10 LAB — CBC
HCT: 36.8 % (ref 36.0–46.0)
Hemoglobin: 12.7 g/dL (ref 12.0–15.0)
MCH: 28.3 pg (ref 26.0–34.0)
MCHC: 34.5 g/dL (ref 30.0–36.0)
MCV: 82.1 fL (ref 80.0–100.0)
Platelets: 625 K/uL — ABNORMAL HIGH (ref 150–400)
RBC: 4.48 MIL/uL (ref 3.87–5.11)
RDW: 13.9 % (ref 11.5–15.5)
WBC: 11.7 K/uL — ABNORMAL HIGH (ref 4.0–10.5)
nRBC: 0 % (ref 0.0–0.2)

## 2024-04-10 LAB — SODIUM, URINE, RANDOM: Sodium, Ur: 60 mmol/L

## 2024-04-10 LAB — MAGNESIUM: Magnesium: 1.6 mg/dL — ABNORMAL LOW (ref 1.7–2.4)

## 2024-04-10 LAB — LIPASE, BLOOD: Lipase: 49 U/L (ref 11–51)

## 2024-04-10 MED ORDER — GABAPENTIN 400 MG PO CAPS
800.0000 mg | ORAL_CAPSULE | Freq: Three times a day (TID) | ORAL | Status: DC
Start: 2024-04-10 — End: 2024-04-12
  Administered 2024-04-10 – 2024-04-12 (×5): 800 mg via ORAL
  Filled 2024-04-10 (×5): qty 2

## 2024-04-10 MED ORDER — HYDROMORPHONE HCL 1 MG/ML IJ SOLN
0.5000 mg | Freq: Once | INTRAMUSCULAR | Status: AC
  Administered 2024-04-10: 0.5 mg via INTRAVENOUS
  Filled 2024-04-10: qty 0.5

## 2024-04-10 MED ORDER — POTASSIUM CHLORIDE 20 MEQ PO PACK
40.0000 meq | PACK | ORAL | Status: AC
  Administered 2024-04-10 (×2): 40 meq via ORAL
  Filled 2024-04-10 (×2): qty 2

## 2024-04-10 MED ORDER — OXYCODONE HCL 5 MG PO TABS
15.0000 mg | ORAL_TABLET | Freq: Four times a day (QID) | ORAL | Status: DC | PRN
Start: 2024-04-10 — End: 2024-04-12
  Administered 2024-04-10 – 2024-04-12 (×6): 15 mg via ORAL
  Filled 2024-04-10 (×6): qty 3

## 2024-04-10 MED ORDER — SODIUM CHLORIDE 0.9 % IV SOLN
12.5000 mg | Freq: Once | INTRAVENOUS | Status: AC
  Administered 2024-04-10: 12.5 mg via INTRAVENOUS
  Filled 2024-04-10: qty 0.5

## 2024-04-10 MED ORDER — POLYETHYLENE GLYCOL 3350 17 G PO PACK: 17.0000 g | PACK | Freq: Every day | ORAL | Status: DC | PRN

## 2024-04-10 MED ORDER — ENOXAPARIN SODIUM 40 MG/0.4ML IJ SOSY
40.0000 mg | PREFILLED_SYRINGE | INTRAMUSCULAR | Status: DC
  Administered 2024-04-11 – 2024-04-12 (×2): 40 mg via SUBCUTANEOUS
  Filled 2024-04-10 (×2): qty 0.4

## 2024-04-10 MED ORDER — KETOROLAC TROMETHAMINE 60 MG/2ML IM SOLN
30.0000 mg | Freq: Once | INTRAMUSCULAR | Status: AC
  Administered 2024-04-10: 30 mg via INTRAMUSCULAR
  Filled 2024-04-10: qty 2

## 2024-04-10 MED ORDER — SODIUM CHLORIDE 0.9 % IV BOLUS
500.0000 mL | Freq: Once | INTRAVENOUS | Status: AC
  Administered 2024-04-10: 500 mL via INTRAVENOUS

## 2024-04-10 MED ORDER — ACETAMINOPHEN 325 MG PO TABS: 650.0000 mg | ORAL_TABLET | Freq: Four times a day (QID) | ORAL | Status: DC | PRN

## 2024-04-10 MED ORDER — ACETAMINOPHEN 650 MG RE SUPP: 650.0000 mg | Freq: Four times a day (QID) | RECTAL | Status: DC | PRN

## 2024-04-10 MED ORDER — LAMOTRIGINE 100 MG PO TABS
100.0000 mg | ORAL_TABLET | Freq: Every day | ORAL | Status: DC
  Administered 2024-04-10 – 2024-04-11 (×2): 100 mg via ORAL
  Filled 2024-04-10: qty 4
  Filled 2024-04-10: qty 1

## 2024-04-10 MED ORDER — ONDANSETRON HCL 4 MG PO TABS
4.0000 mg | ORAL_TABLET | Freq: Four times a day (QID) | ORAL | Status: DC | PRN
  Administered 2024-04-11: 4 mg via ORAL
  Filled 2024-04-10: qty 1

## 2024-04-10 MED ORDER — HYDROMORPHONE HCL 1 MG/ML IJ SOLN
0.5000 mg | INTRAMUSCULAR | Status: AC | PRN
  Administered 2024-04-10 (×2): 0.5 mg via INTRAVENOUS
  Filled 2024-04-10 (×2): qty 0.5

## 2024-04-10 MED ORDER — IOHEXOL 300 MG/ML  SOLN
100.0000 mL | Freq: Once | INTRAMUSCULAR | Status: AC | PRN
  Administered 2024-04-10: 100 mL via INTRAVENOUS

## 2024-04-10 MED ORDER — SODIUM CHLORIDE 0.9 % IV SOLN: INTRAVENOUS | Status: DC

## 2024-04-10 MED ORDER — ONDANSETRON HCL 4 MG/2ML IJ SOLN: 4.0000 mg | Freq: Four times a day (QID) | INTRAMUSCULAR | Status: DC | PRN

## 2024-04-10 MED ORDER — SCOPOLAMINE 1 MG/3DAYS TD PT72
1.0000 | MEDICATED_PATCH | TRANSDERMAL | Status: DC
  Administered 2024-04-10: 1 mg via TRANSDERMAL
  Filled 2024-04-10: qty 1

## 2024-04-10 MED ORDER — DULOXETINE HCL 60 MG PO CPEP
60.0000 mg | ORAL_CAPSULE | Freq: Every day | ORAL | Status: DC
  Administered 2024-04-11 – 2024-04-12 (×2): 60 mg via ORAL
  Filled 2024-04-10 (×2): qty 1

## 2024-04-10 MED ORDER — POTASSIUM CHLORIDE 10 MEQ/100ML IV SOLN
10.0000 meq | INTRAVENOUS | Status: AC
  Administered 2024-04-10 (×4): 10 meq via INTRAVENOUS
  Filled 2024-04-10 (×4): qty 100

## 2024-04-10 MED ORDER — OXYCODONE-ACETAMINOPHEN 5-325 MG PO TABS
1.0000 | ORAL_TABLET | Freq: Once | ORAL | Status: AC
  Administered 2024-04-10: 1 via ORAL
  Filled 2024-04-10: qty 1

## 2024-04-10 MED ORDER — METHOCARBAMOL 500 MG PO TABS
500.0000 mg | ORAL_TABLET | Freq: Three times a day (TID) | ORAL | Status: AC
Start: 2024-04-10 — End: 2024-04-11
  Administered 2024-04-10 – 2024-04-11 (×3): 500 mg via ORAL
  Filled 2024-04-10 (×3): qty 1

## 2024-04-10 MED ORDER — ONDANSETRON HCL 4 MG/2ML IJ SOLN
4.0000 mg | Freq: Once | INTRAMUSCULAR | Status: AC | PRN
  Administered 2024-04-10: 4 mg via INTRAVENOUS
  Filled 2024-04-10: qty 2

## 2024-04-10 NOTE — H&P (Addendum)
 History and Physical    Allison Berry FMW:[id number] DOB: 08-02-1965 DOA: 04/10/2024  PCP: Atha Repress, DO   Patient coming from: Home  I have personally briefly reviewed patient's old medical records in [facility]  Chief Complaint: Back pain  HPI: Allison Berry is a 58 y.o. female with medical history significant for hypertension, depression and anxiety. Patient presented to the ED with complaints of worsening back pain since recent hospitalization, triage notes,- she also just reports some pain with urination and foul smell to her urine, she denies both of this to me.  She denies fever or chills.  No cough or difficulty breathing.  She reports a fall prior to her last hospitalization, none since. She reports vomiting, that started yesterday, she has had about 5-6 episodes since onset.  2 episodes of loose stool also yesterday.  No abdominal pain.  Has had poor oral intake for the past 4 days.  Denies excessive free water  drinking.  Reports episodes of feeling a bit confused/short-term memory problems since she was discharged from the hospital.  Recent hospitalization 8/13 to 8/19 for septic shock and acute metabolic encephalopathy secondary to urinary tract infection.  Cultures grew E. Coli-polysensitive.  ED Course: Tmax 98.4.  Heart rate 91-99.  Respirate rate 18-21.  Blood pressure systolic 146-178.  O2 sat greater than 95% on room air. Sodium 121.  Potassium 2.8.  WBC 11.7.  UA not suggestive of UTI. CT abdomen and pelvis with contrast-no evidence of urinary tract obstruction, limited visualization of the bladder due to artifact.  Diverticulosis without diverticulitis.  No bowel obstruction, progressed L1 compression fracture.  Review of Systems: As per HPI all other systems reviewed and negative.  Past Medical History:  Diagnosis Date   Chronic back pain 07/20/2018   Dyslipidemia 07/20/2018   Hypertension 07/20/2018    Past Surgical History:  Procedure Laterality Date   TOTAL  HIP ARTHROPLASTY Left 10/15/2020   Procedure: TOTAL HIP ARTHROPLASTY ANTERIOR APPROACH;  Surgeon: Jerri Kay HERO, MD;  Location: [facility] OR;  Service: Orthopedics;  Laterality: Left;     reports that she has been smoking cigarettes. She has never used smokeless tobacco. She reports that she does not drink alcohol  and does not use drugs.  No Known Allergies  Family history of hypertension.  Prior to Admission medications   Medication Sig Start Date End Date Taking? Authorizing Provider  alendronate (FOSAMAX) 70 MG tablet Take 70 mg by mouth once a week.    [provider]  clonazePAM  (KLONOPIN ) 1 MG tablet Take 1 mg by mouth 2 (two) times daily as needed.    [provider]  cyclobenzaprine (FLEXERIL) 10 MG tablet Take 10 mg by mouth 3 (three) times daily as needed for muscle spasms. 07/17/20   [provider]  diclofenac  Sodium (VOLTAREN ) 1 % GEL Apply 2 g topically in the morning and at bedtime. Apply to right knee 03/05/24   Arrien, Elidia Sieving, MD  docusate sodium  (COLACE) 100 MG capsule Take 1 capsule (100 mg total) by mouth 2 (two) times daily. 10/19/20   Nitka, James E, MD  DULoxetine  (CYMBALTA ) 60 MG capsule Take 60 mg by mouth daily.    [provider]  feeding supplement (ENSURE PLUS HIGH PROTEIN) LIQD Take 237 mLs by mouth 2 (two) times daily between meals. 03/05/24   Arrien, Mauricio Daniel, MD  gabapentin  (NEURONTIN ) 800 MG tablet Take 800 mg by mouth 3 (three) times daily.    [provider]  ibuprofen (ADVIL,MOTRIN) 800  MG tablet Take 200 mg by mouth every 8 (eight) hours as needed for mild pain, headache or fever.    [provider]  lamoTRIgine  (LAMICTAL ) 100 MG tablet Take 100 mg by mouth at bedtime. 06/30/22   [provider]  lisinopril  (ZESTRIL ) 10 MG tablet Take 1 tablet (10 mg total) by mouth daily. 03/05/24 04/04/24  Arrien, Mauricio Daniel, MD  naloxone [facility]) nasal spray 4 mg/0.1 mL Place 0.4 mg into the nose  once. 09/16/20   [provider]  oxyCODONE  (ROXICODONE ) 15 MG immediate release tablet Take 15 mg by mouth 4 (four) times daily as needed.    [provider]  Vilazodone HCl 20 MG TABS Take 1 tablet by mouth every morning.    [provider]    Physical Exam: Vitals:   04/10/24 1548 04/10/24 1550 04/10/24 1559 04/10/24 1600  BP: (!) 146/94   (!) 146/98  Pulse: 91 94  99  Resp:  19  (!) 21  Temp:   98.4 F (36.9 C)   TempSrc:   Oral   SpO2: 98% 99%  95%  Weight:      Height:        Constitutional: NAD, calm, comfortable Vitals:   04/10/24 1548 04/10/24 1550 04/10/24 1559 04/10/24 1600  BP: (!) 146/94   (!) 146/98  Pulse: 91 94  99  Resp:  19  (!) 21  Temp:   98.4 F (36.9 C)   TempSrc:   Oral   SpO2: 98% 99%  95%  Weight:      Height:       Eyes: PERRL, lids and conjunctivae normal ENMT: Mucous membranes are dry.  Neck: normal, supple, no masses, no thyromegaly Respiratory: clear to auscultation bilaterally, no wheezing, no crackles. Normal respiratory effort. No accessory muscle use.  Cardiovascular: Regular rate and rhythm, no murmurs / rubs / gallops. No extremity edema.  Extremities warm. Abdomen: no tenderness, no masses palpated. No hepatosplenomegaly. Bowel sounds positive.  Musculoskeletal: no clubbing / cyanosis. No joint deformity upper and lower extremities.  Tenderness to palpation around L1, but patient was tender in lower lumbar spine. Skin: no rashes, lesions, ulcers. No induration Neurologic: No facial asymmetry, moving extremities spontaneously, speech fluent.[name]  Psychiatric: Normal judgment and insight. Alert and oriented x 3. Normal mood.   Labs on Admission: I have personally reviewed following labs and imaging studies  CBC: Recent Labs  Lab 04/10/24 1242  WBC 11.7*  HGB 12.7  HCT 36.8  MCV 82.1  PLT 625*   Basic Metabolic Panel: Recent Labs  Lab 04/10/24 1242  NA 121*  K 2.8*  CL 87*  CO2 19*  GLUCOSE 110*   BUN 6  CREATININE 0.47  CALCIUM  8.9   GFR: Estimated Creatinine Clearance: 63.9 mL/min (by C-G formula based on SCr of 0.47 mg/dL). Liver Function Tests: Recent Labs  Lab 04/10/24 1242  AST 18  ALT 11  ALKPHOS 105  BILITOT 0.7  PROT 8.0  ALBUMIN  4.1   Recent Labs  Lab 04/10/24 1242  LIPASE 49   Urine analysis:    Component Value Date/Time   COLORURINE STRAW (A) 04/10/2024 1245   APPEARANCEUR CLEAR 04/10/2024 1245   LABSPEC 1.008 04/10/2024 1245   PHURINE 6.0 04/10/2024 1245   GLUCOSEU NEGATIVE 04/10/2024 1245   HGBUR NEGATIVE 04/10/2024 1245   BILIRUBINUR NEGATIVE 04/10/2024 1245   KETONESUR NEGATIVE 04/10/2024 1245   PROTEINUR 100 (A) 04/10/2024 1245   NITRITE NEGATIVE 04/10/2024 1245   LEUKOCYTESUR NEGATIVE  04/10/2024 1245    Radiological Exams on Admission: CT ABDOMEN PELVIS W CONTRAST Result Date: 04/10/2024 CLINICAL DATA:  Low back pain and flank pain. Pain with urination and foul odor. Fevers at home. Left hip fracture. EXAM: CT ABDOMEN AND PELVIS WITH CONTRAST TECHNIQUE: Multidetector CT imaging of the abdomen and pelvis was performed using the standard protocol following bolus administration of intravenous contrast. RADIATION DOSE REDUCTION: This exam was performed according to the departmental dose-optimization program which includes automated exposure control, adjustment of the mA and/or kV according to patient size and/or use of iterative reconstruction technique. CONTRAST:  OMNIPAQUE  IOHEXOL  300 MG/ML  SOLN COMPARISON:  CT chest abdomen pelvis 12/20/2023 FINDINGS: Lower chest: Motion artifact.  Lung bases appear grossly clear. Hepatobiliary: No focal liver abnormality is seen. No gallstones, gallbladder wall thickening, or biliary dilatation. Pancreas: Unremarkable. No pancreatic ductal dilatation or surrounding inflammatory changes. Spleen: Normal in size without focal abnormality. Adrenals/Urinary Tract: No adrenal gland nodules. Kidneys are symmetrical  with symmetrical and homogeneous appearing nephrograms. No hydronephrosis or hydroureter. Limited visualization of the bladder due to under distension and streak artifact from hip prosthesis. Stomach/Bowel: Stomach, small bowel, and colon are mostly decompressed. No wall thickening or inflammatory stranding are appreciated. Sigmoid colonic diverticulosis without evidence of acute diverticulitis. Appendix is normal. Vascular/Lymphatic: Aortic atherosclerosis. No enlarged abdominal or pelvic lymph nodes. Reproductive: Limited visualization of the pelvis due to streak artifact. No pelvic mass identified. Other: No free air or free fluid in the abdomen. Abdominal wall musculature appears intact. Musculoskeletal: Postoperative changes including posterior fixation intervertebral fusion at L4-5. Slight anterior subluxation at L3-4. Compression of the superior endplate at L1. Irregular endplates at T11-12, unchanged. Compression at L1 is progressed since the prior study possibly representing acute compression. Postoperative left hip arthroplasty. Components appear grossly well seated. Old fracture deformity of the right inferior pubic ramus. No acute pelvic fractures are identified. IMPRESSION: 1. No evidence of urinary tract obstruction. Limited visualization of the bladder due to artifact. 2. Colonic diverticulosis without evidence of acute diverticulitis. No bowel obstruction. 3. Aortic atherosclerosis. 4. Compression of the superior endplate of L1 appears progressed since prior study possibly representing acute compression. Electronically Signed   By: Elsie Gravely M.D.   On: 04/10/2024 16:11   EKG: Independently reviewed.  Sinus rhythm, rate 94, QTc 508.  Assessment/Plan Principal Problem:   Hyponatremia   Assessment and Plan: No notes have been filed under this hospital service. Service: Hospitalist  Electrolyte abnormalities/dehydration- hyponatremia-121-baseline mid 130s.  Hypokalemia-2.8.  Reports 4  days of poor oral intake, vomiting onset last night, and 2 episodes of diarrhea. On HCTZ and Cymbalta . Dry mucous membranes. Benign abdomen.  Afebrile.  WBC 11.7.  CTAP WC- no acute intra-abdominal abnormality.  UA not suggestive of UTI. - 500 mL bolus given, continue N/s 100cc/hr x 1 day - Replete K - Check Magnesium  - Urine and serum osmolality - Urine sodium- 60 - TSH - Hold HCTZ and Cymbalta   L1 compression fracture- reports fall 2 weeks ago, prior to recent hospitalization.  CT AP - compression of the superior endplate of L1 appears progressed since prior study possibly representing acute compression. -Needs to follow-up as outpatient - TLSO brace - Pain control with opiates, muscle relaxants  - Resume gabapentin   Prolonged QTc-508.  Replete K  Hypertension-stable. - Holding lisinopril  10/HCTZ 12.5 with hyponatremia, to allow for hydration and with contrast exposure.  Depression/anxiety -Resume Lamictal  - Hold cymbalta  with hyponatremia   DVT prophylaxis: Lovenox  Code Status: FULL Family  Communication: None at bedside Disposition Plan: ~ 2 days Consults called: None Admission status:  Obs stepdown.    Author: Tully FORBES Carwin, MD 04/10/2024 7:29 PM  For on call review [email or web address].

## 2024-04-10 NOTE — ED Triage Notes (Addendum)
 Pt came in via [facility] with complaints of back pain. Pt also complains of pain with urination and foul odor. States she had fevers at home.  Previous fracture to L hip and is complaining of pain there. No recent falls.

## 2024-04-10 NOTE — ED Provider Notes (Signed)
 [place] EMERGENCY DEPARTMENT AT [facility] Provider Note   CSN: [id number] Arrival date & time: 04/10/24  1120     Patient presents with: Back Pain   Allison Berry is a 58 y.o. female.  She has history of hypertension, dyslipidemia.  She had recent hospitalization on 8/13 for septic shock secondary to UTI, ultimately discharged on 8/18.  She also had an AKI.  She reports she has been doing well, but now is having severe low back and hip pain with nausea and vomiting.  Denies injury or trauma.  She is having some pain with urination and states her urine has a foul odor.    Back Pain      Prior to Admission medications   Medication Sig Start Date End Date Taking? Authorizing Provider  alendronate (FOSAMAX) 70 MG tablet Take 70 mg by mouth once a week.   Yes [provider]  clonazePAM  (KLONOPIN ) 1 MG tablet Take 1 mg by mouth 2 (two) times daily as needed for anxiety.   Yes [provider]  diclofenac  Sodium (VOLTAREN ) 1 % GEL Apply 2 g topically in the morning and at bedtime. Apply to right knee 03/05/24  Yes Arrien, Elidia Sieving, MD  docusate sodium  (COLACE) 100 MG capsule Take 1 capsule (100 mg total) by mouth 2 (two) times daily. Patient taking differently: Take 100 mg by mouth 2 (two) times daily as needed for mild constipation. 10/19/20  Yes Nitka, James E, MD  DULoxetine  (CYMBALTA ) 60 MG capsule Take 60 mg by mouth daily.   Yes [provider]  feeding supplement (ENSURE PLUS HIGH PROTEIN) LIQD Take 237 mLs by mouth 2 (two) times daily between meals. 03/05/24  Yes Arrien, Mauricio Daniel, MD  gabapentin  (NEURONTIN ) 800 MG tablet Take 800 mg by mouth 3 (three) times daily.   Yes [provider]  ibuprofen (ADVIL) 200 MG tablet Take 200 mg by mouth every 8 (eight) hours as needed for mild pain (pain score 1-3), headache or fever.   Yes [provider]  lamoTRIgine  (LAMICTAL ) 100 MG tablet Take 100 mg by mouth at bedtime.  06/30/22  Yes [provider]  lisinopril  (ZESTRIL ) 10 MG tablet Take 1 tablet (10 mg total) by mouth daily. 03/05/24 04/10/24 Yes Arrien, Mauricio Daniel, MD  lisinopril -hydrochlorothiazide  (ZESTORETIC ) 10-12.5 MG tablet Take 1 tablet by mouth daily. 03/22/24  Yes [provider]  naloxone (NARCAN) nasal spray 4 mg/0.1 mL Place 0.4 mg into the nose once. 09/16/20  Yes [provider]  oxyCODONE  (ROXICODONE ) 15 MG immediate release tablet Take 15 mg by mouth 4 (four) times daily as needed for pain.   Yes [provider]    Allergies: Patient has no known allergies.    Review of Systems  Musculoskeletal:  Positive for back pain.    Updated Vital Signs BP (!) 150/92 (BP Location: Left Arm)   Pulse 96   Temp 98.5 F (36.9 C)   Resp 16   Ht 5' 6 (1.676 m)   Wt 52.2 kg   SpO2 98%   BMI 18.56 kg/m   Physical Exam Vitals and nursing note reviewed.  Constitutional:      General: She is not in acute distress.    Appearance: She is well-developed.     Comments: Patient is actively vomiting   HENT:     Head: Normocephalic and atraumatic.  Eyes:     Extraocular Movements: Extraocular movements intact.     Conjunctiva/sclera: Conjunctivae normal.  Cardiovascular:  Rate and Rhythm: Normal rate and regular rhythm.     Heart sounds: No murmur heard. Pulmonary:     Effort: Pulmonary effort is normal. No respiratory distress.     Breath sounds: Normal breath sounds.  Abdominal:     Palpations: Abdomen is soft.     Tenderness: There is no abdominal tenderness.  Musculoskeletal:        General: No swelling.     Cervical back: Neck supple.  Skin:    General: Skin is warm and dry.     Capillary Refill: Capillary refill takes less than 2 seconds.  Neurological:     General: No focal deficit present.     Mental Status: She is alert and oriented to person, place, and time.  Psychiatric:        Mood and Affect: Mood normal.     (all labs ordered are  listed, but only abnormal results are displayed) Labs Reviewed  COMPREHENSIVE METABOLIC PANEL WITH GFR - Abnormal; Notable for the following components:      Result Value   Sodium 121 (*)    Potassium 2.8 (*)    Chloride 87 (*)    CO2 19 (*)    Glucose, Bld 110 (*)    All other components within normal limits  CBC - Abnormal; Notable for the following components:   WBC 11.7 (*)    Platelets 625 (*)    All other components within normal limits  URINALYSIS, ROUTINE W REFLEX MICROSCOPIC - Abnormal; Notable for the following components:   Color, Urine STRAW (*)    Protein, ur 100 (*)    All other components within normal limits  LIPASE, BLOOD  SODIUM, URINE, RANDOM  MAGNESIUM   OSMOLALITY, URINE  OSMOLALITY    EKG: EKG Interpretation Date/Time:  Tuesday April 10 2024 14:36:01 EDT Ventricular Rate:  94 PR Interval:  129 QRS Duration:  83 QT Interval:  406 QTC Calculation: 508 R Axis:   69  Text Interpretation: Sinus rhythm LAE, consider biatrial enlargement Borderline prolonged QT interval Confirmed by Melvenia Motto [phone]) on 04/10/2024 2:50:14 PM  Radiology: CT ABDOMEN PELVIS W CONTRAST Result Date: 04/10/2024 CLINICAL DATA:  Low back pain and flank pain. Pain with urination and foul odor. Fevers at home. Left hip fracture. EXAM: CT ABDOMEN AND PELVIS WITH CONTRAST TECHNIQUE: Multidetector CT imaging of the abdomen and pelvis was performed using the standard protocol following bolus administration of intravenous contrast. RADIATION DOSE REDUCTION: This exam was performed according to the departmental dose-optimization program which includes automated exposure control, adjustment of the mA and/or kV according to patient size and/or use of iterative reconstruction technique. CONTRAST:  OMNIPAQUE  IOHEXOL  300 MG/ML  SOLN COMPARISON:  CT chest abdomen pelvis 12/20/2023 FINDINGS: Lower chest: Motion artifact.  Lung bases appear grossly clear. Hepatobiliary: No focal liver  abnormality is seen. No gallstones, gallbladder wall thickening, or biliary dilatation. Pancreas: Unremarkable. No pancreatic ductal dilatation or surrounding inflammatory changes. Spleen: Normal in size without focal abnormality. Adrenals/Urinary Tract: No adrenal gland nodules. Kidneys are symmetrical with symmetrical and homogeneous appearing nephrograms. No hydronephrosis or hydroureter. Limited visualization of the bladder due to under distension and streak artifact from hip prosthesis. Stomach/Bowel: Stomach, small bowel, and colon are mostly decompressed. No wall thickening or inflammatory stranding are appreciated. Sigmoid colonic diverticulosis without evidence of acute diverticulitis. Appendix is normal. Vascular/Lymphatic: Aortic atherosclerosis. No enlarged abdominal or pelvic lymph nodes. Reproductive: Limited visualization of the pelvis due to streak artifact. No pelvic mass identified. Other: No  free air or free fluid in the abdomen. Abdominal wall musculature appears intact. Musculoskeletal: Postoperative changes including posterior fixation intervertebral fusion at L4-5. Slight anterior subluxation at L3-4. Compression of the superior endplate at L1. Irregular endplates at T11-12, unchanged. Compression at L1 is progressed since the prior study possibly representing acute compression. Postoperative left hip arthroplasty. Components appear grossly well seated. Old fracture deformity of the right inferior pubic ramus. No acute pelvic fractures are identified. IMPRESSION: 1. No evidence of urinary tract obstruction. Limited visualization of the bladder due to artifact. 2. Colonic diverticulosis without evidence of acute diverticulitis. No bowel obstruction. 3. Aortic atherosclerosis. 4. Compression of the superior endplate of L1 appears progressed since prior study possibly representing acute compression. Electronically Signed   By: Elsie Gravely M.D.   On: 04/10/2024 16:11     Procedures    Medications Ordered in the ED  scopolamine  (TRANSDERM-SCOP) 1 MG/3DAYS 1 mg (1 mg Transdermal Patch Applied 04/10/24 1452)  potassium chloride  10 mEq in 100 mL IVPB (0 mEq Intravenous Stopped 04/10/24 2007)  HYDROmorphone  (DILAUDID ) injection 0.5 mg (0.5 mg Intravenous Given 04/10/24 1813)  0.9 %  sodium chloride  infusion (has no administration in time range)  potassium chloride  (KLOR-CON ) packet 40 mEq (40 mEq Oral Given 04/10/24 2014)  ondansetron  (ZOFRAN ) injection 4 mg (4 mg Intravenous Given 04/10/24 1251)  ketorolac  (TORADOL ) injection 30 mg (30 mg Intramuscular Given 04/10/24 1317)  oxyCODONE -acetaminophen  (PERCOCET/ROXICET) 5-325 MG per tablet 1 tablet (1 tablet Oral Given 04/10/24 1323)  promethazine  (PHENERGAN ) 12.5 mg in sodium chloride  0.9 % 50 mL IVPB (0 mg Intravenous Stopped 04/10/24 1425)  sodium chloride  0.9 % bolus 500 mL (0 mLs Intravenous Stopped 04/10/24 1452)  HYDROmorphone  (DILAUDID ) injection 0.5 mg (0.5 mg Intravenous Given 04/10/24 1452)  iohexol  (OMNIPAQUE ) 300 MG/ML solution 100 mL (100 mLs Intravenous Contrast Given 04/10/24 1532)                                    Medical Decision Making This patient presents to the ED for concern of nausea vomiting and back pain, this involves an extensive number of treatment options, and is a complaint that carries with it a high risk of complications and morbidity.  The differential diagnosis includes gastroenteritis, gastritis, kidney stone, UTI, electrolyte disturbance, DKA, pancreatitis, cholecystitis, other   Co morbidities that complicate the patient evaluation :   Hypertension, dyslipidemia   Additional history obtained:  Additional history obtained from EMR External records from outside source obtained and reviewed including prior notes, labs, imaging   Lab Tests:  I Ordered, and personally interpreted labs.  The pertinent results include: CBC with mild leukocytosis and thrombocytosis, no UTI, lipase normal, CMP with  significant hyponatremia sodium 121, potassium low at 2.8, chloride of 87   Imaging Studies ordered:  I ordered imaging studies including CT abdomen pelvis which shows no obstructive uropathy, possible new compression fracture the patient has no tenderness at this area so this is not felt to be the cause of her pain. I independently visualized and interpreted imaging within scope of identifying emergent findings  I agree with the radiologist interpretation   Cardiac Monitoring: / EKG:  The patient was maintained on a cardiac monitor.  I personally viewed and interpreted the cardiac monitored which showed an underlying rhythm of: Sinus rhythm, QT slightly prolonged at 508   Consultations Obtained:  I requested consultation with the Dr. Pearlean,  and discussed  lab and imaging findings as well as pertinent plan - they recommend: Admission for hyponatremia   Problem List / ED Course / Critical interventions / Medication management  Nausea vomiting with back pain-patient had no recent trauma, no fevers or chills the states she is having intractable vomiting since last night.  She also states she is feels slightly confused and feel like her memory is not working right today.  Denies numbness or tingling, denies urinary symptoms but is having a lot of pain in her back, she is worried because she had septic shock requiring hospitalization in the past month. Patient evaluated is extremely uncomfortable, actively vomiting and bent over holding her low back.  Labs showed significant hyponatremia which is new from 1 month ago when she had normal sodium levels.  She has had hypokalemia and hypochloremia.  She is given small amount of fluids.  Denies drinking excess water  or drinking beer.  Patient is feeling better after Dilaudid , she had no relief with Toradol  and Percocet from her pain.  CT did not show any acute findings.  She was still vomiting after Zofran  and Phenergan .  She does have borderline QT  prolongation so scopolamine  patch was ordered.  She now appears comfortable, I discussed her lab findings and need for admission at this time.  I have reviewed the patients home medicines and have made adjustments as needed       Amount and/or Complexity of Data Reviewed Labs: ordered. Radiology: ordered.  Risk Prescription drug management. Decision regarding hospitalization.        Final diagnoses:  Hyponatremia  Acute left-sided low back pain, unspecified whether sciatica present  Nausea and vomiting, unspecified vomiting type    ED Discharge Orders     None          Suellen Sherran DELENA DEVONNA 04/10/24 2018    Melvenia Motto, MD 04/14/24 1059

## 2024-04-11 DIAGNOSIS — E871 Hypo-osmolality and hyponatremia: Secondary | ICD-10-CM | POA: Diagnosis not present

## 2024-04-11 LAB — BASIC METABOLIC PANEL WITH GFR
Anion gap: 10 (ref 5–15)
BUN: 8 mg/dL (ref 6–20)
CO2: 20 mmol/L — ABNORMAL LOW (ref 22–32)
Calcium: 9.3 mg/dL (ref 8.9–10.3)
Chloride: 98 mmol/L (ref 98–111)
Creatinine, Ser: 0.49 mg/dL (ref 0.44–1.00)
GFR, Estimated: >60 mL/min (ref >60)
Glucose, Bld: 95 mg/dL (ref 70–99)
Potassium: 5.2 mmol/L — ABNORMAL HIGH (ref 3.5–5.1)
Sodium: 128 mmol/L — ABNORMAL LOW (ref 135–145)

## 2024-04-11 LAB — CBC
HCT: 36.2 % (ref 36.0–46.0)
Hemoglobin: 12.2 g/dL (ref 12.0–15.0)
MCH: 28.8 pg (ref 26.0–34.0)
MCHC: 33.7 g/dL (ref 30.0–36.0)
MCV: 85.6 fL (ref 80.0–100.0)
Platelets: 529 K/uL — ABNORMAL HIGH (ref 150–400)
RBC: 4.23 MIL/uL (ref 3.87–5.11)
RDW: 14.5 % (ref 11.5–15.5)
WBC: 10.9 K/uL — ABNORMAL HIGH (ref 4.0–10.5)
nRBC: 0 % (ref 0.0–0.2)

## 2024-04-11 LAB — OSMOLALITY, URINE: Osmolality, Ur: 213 mosm/kg — ABNORMAL LOW (ref 300–900)

## 2024-04-11 LAB — OSMOLALITY: Osmolality: 271 mosm/kg — ABNORMAL LOW (ref 275–295)

## 2024-04-11 MED ORDER — AMLODIPINE BESYLATE 5 MG PO TABS
2.5000 mg | ORAL_TABLET | Freq: Every day | ORAL | Status: DC
  Administered 2024-04-11 – 2024-04-12 (×2): 2.5 mg via ORAL
  Filled 2024-04-11 (×2): qty 1

## 2024-04-11 MED ORDER — HYDROMORPHONE HCL 1 MG/ML IJ SOLN
0.5000 mg | INTRAMUSCULAR | Status: DC | PRN
  Administered 2024-04-11 – 2024-04-12 (×4): 0.5 mg via INTRAVENOUS
  Filled 2024-04-11: qty 1
  Filled 2024-04-11: qty 0.5
  Filled 2024-04-11: qty 1
  Filled 2024-04-11: qty 0.5

## 2024-04-11 MED ORDER — AMLODIPINE BESYLATE 5 MG PO TABS: 5.0000 mg | ORAL_TABLET | Freq: Every day | ORAL | Status: DC

## 2024-04-11 NOTE — ED Notes (Addendum)
 Pt sleeping at this time- pt has took off monitoring equipment

## 2024-04-11 NOTE — Plan of Care (Signed)
  Problem: Acute Rehab PT Goals(only PT should resolve) Goal: Pt Will Go Supine/Side To Sit Outcome: Progressing Flowsheets (Taken 04/11/2024 1456) Pt will go Supine/Side to Sit:  Independently  with modified independence Goal: Patient Will Transfer Sit To/From Stand Outcome: Progressing Flowsheets (Taken 04/11/2024 1456) Patient will transfer sit to/from stand: with modified independence Goal: Pt Will Transfer Bed To Chair/Chair To Bed Outcome: Progressing Flowsheets (Taken 04/11/2024 1456) Pt will Transfer Bed to Chair/Chair to Bed: with modified independence Goal: Pt Will Ambulate Outcome: Progressing Flowsheets (Taken 04/11/2024 1456) Pt will Ambulate:  75 feet  with modified independence  with supervision  with rolling walker   2:56 PM, 04/11/24 Lynwood Music, MPT Physical Therapist with [facility] 336 [phone] office [phone] mobile phone

## 2024-04-11 NOTE — ED Notes (Signed)
 ED TO INPATIENT HANDOFF REPORT  ED Nurse Name and Phone #: [phone]  S Name/Age/Gender Allison Berry 58 y.o. female Room/Bed: APA10/APA10  Code Status   Code Status: Full Code  Home/SNF/Other Home Patient oriented to: self, place, time, and situation Is this baseline? Yes   Triage Complete: Triage complete  Chief Complaint Hyponatremia [E87.1]  Triage Note Pt came in via [facility] with complaints of back pain. Pt also complains of pain with urination and foul odor. States she had fevers at home.  Previous fracture to L hip and is complaining of pain there. No recent falls.   Allergies No Known Allergies  Level of Care/Admitting Diagnosis ED Disposition     ED Disposition  Admit   Condition  --   Comment  Hospital Area: [facility] [[id number]]  Level of Care: Telemetry [5]  Covid Evaluation: Asymptomatic - no recent exposure (last 10 days) testing not required  Diagnosis: Hyponatremia [801480]  Admitting Physician: EMOKPAE, EJIROGHENE E [[id number]  Attending Physician: EMOKPAE, EJIROGHENE E [phone]  For patients discharging to extended facilities (i.e. SNF, AL, group homes or LTAC) initiate:: Discharge to SNF/Facility Placement COVID-19 Lab Testing Protocol          B Medical/Surgery History Past Medical History:  Diagnosis Date   Chronic back pain 07/20/2018   Dyslipidemia 07/20/2018   Hypertension 07/20/2018   Past Surgical History:  Procedure Laterality Date   TOTAL HIP ARTHROPLASTY Left 10/15/2020   Procedure: TOTAL HIP ARTHROPLASTY ANTERIOR APPROACH;  Surgeon: Jerri Kay HERO, MD;  Location: [facility] OR;  Service: Orthopedics;  Laterality: Left;     A IV Location/Drains/Wounds Patient Lines/Drains/Airways Status     Active Line/Drains/Airways     Name Placement date Placement time Site Days   Peripheral IV 04/10/24 20 G Right Antecubital 04/10/24  1131  Antecubital  1            Intake/Output Last 24 hours  Intake/Output Summary (Last 24 hours) at  04/11/2024 0755 Last data filed at 04/11/2024 0741 Gross per 24 hour  Intake 1756.98 ml  Output --  Net 1756.98 ml    Labs/Imaging Results for orders placed or performed during the hospital encounter of 04/10/24 (from the past 48 hours)  Lipase, blood     Status: None   Collection Time: 04/10/24 12:42 PM  Result Value Ref Range   Lipase 49 11 - 51 U/L    Comment: Performed at [facility], [address], [place], [place] [address]  Comprehensive metabolic panel     Status: Abnormal   Collection Time: 04/10/24 12:42 PM  Result Value Ref Range   Sodium 121 (L) 135 - 145 mmol/L   Potassium 2.8 (L) 3.5 - 5.1 mmol/L   Chloride 87 (L) 98 - 111 mmol/L   CO2 19 (L) 22 - 32 mmol/L   Glucose, Bld 110 (H) 70 - 99 mg/dL    Comment: Glucose reference range applies only to samples taken after fasting for at least 8 hours.   BUN 6 6 - 20 mg/dL   Creatinine, Ser 9.52 0.44 - 1.00 mg/dL   Calcium  8.9 8.9 - 10.3 mg/dL   Total Protein 8.0 6.5 - 8.1 g/dL   Albumin  4.1 3.5 - 5.0 g/dL   AST 18 15 - 41 U/L   ALT 11 0 - 44 U/L   Alkaline Phosphatase 105 38 - 126 U/L   Total Bilirubin 0.7 0.0 - 1.2 mg/dL   GFR, Estimated >39 >39 mL/min    Comment: (NOTE)  Calculated using the CKD-EPI Creatinine Equation (2021)    Anion gap 15 5 - 15    Comment: Performed at [facility], [address], [place], [place] [address]  CBC     Status: Abnormal   Collection Time: 04/10/24 12:42 PM  Result Value Ref Range   WBC 11.7 (H) 4.0 - 10.5 K/uL   RBC 4.48 3.87 - 5.11 MIL/uL   Hemoglobin 12.7 12.0 - 15.0 g/dL   HCT 63.1 63.9 - 53.9 %   MCV 82.1 80.0 - 100.0 fL   MCH 28.3 26.0 - 34.0 pg   MCHC 34.5 30.0 - 36.0 g/dL   RDW 86.0 88.4 - 84.4 %   Platelets 625 (H) 150 - 400 K/uL   nRBC 0.0 0.0 - 0.2 %    Comment: Performed at [facility], [address], [place], [place] [address]  Magnesium      Status: Abnormal   Collection Time: 04/10/24 12:42 PM  Result Value Ref Range   Magnesium  1.6 (L) 1.7 - 2.4 mg/dL     Comment: Performed at [facility], [address] [place], [place] [address]  Urinalysis, Routine w reflex microscopic -Urine, Clean Catch     Status: Abnormal   Collection Time: 04/10/24 12:45 PM  Result Value Ref Range   Color, Urine STRAW (A) YELLOW   APPearance CLEAR CLEAR   Specific Gravity, Urine 1.008 1.005 - 1.030   pH 6.0 5.0 - 8.0   Glucose, UA NEGATIVE NEGATIVE mg/dL   Hgb urine dipstick NEGATIVE NEGATIVE   Bilirubin Urine NEGATIVE NEGATIVE   Ketones, ur NEGATIVE NEGATIVE mg/dL   Protein, ur 899 (A) NEGATIVE mg/dL   Nitrite NEGATIVE NEGATIVE   Leukocytes,Ua NEGATIVE NEGATIVE   RBC / HPF 0-5 0 - 5 RBC/hpf   WBC, UA 0-5 0 - 5 WBC/hpf   Bacteria, UA NONE SEEN NONE SEEN   Squamous Epithelial / HPF 0-5 0 - 5 /HPF    Comment: Performed at [facility], [address], [place], [place] [address]  Sodium, urine, random     Status: None   Collection Time: 04/10/24 12:45 PM  Result Value Ref Range   Sodium, Ur 60 mmol/L    Comment: Performed at [facility], [address], [place], [place] [address]  Basic metabolic panel     Status: Abnormal   Collection Time: 04/11/24  3:09 AM  Result Value Ref Range   Sodium 128 (L) 135 - 145 mmol/L    Comment: DELTA CHECK NOTED   Potassium 5.2 (H) 3.5 - 5.1 mmol/L   Chloride 98 98 - 111 mmol/L   CO2 20 (L) 22 - 32 mmol/L   Glucose, Bld 95 70 - 99 mg/dL    Comment: Glucose reference range applies only to samples taken after fasting for at least 8 hours.   BUN 8 6 - 20 mg/dL   Creatinine, Ser 9.50 0.44 - 1.00 mg/dL   Calcium  9.3 8.9 - 10.3 mg/dL   GFR, Estimated >39 >39 mL/min    Comment: (NOTE) Calculated using the CKD-EPI Creatinine Equation (2021)    Anion gap 10 5 - 15    Comment: Performed at [facility], [address], [place], [place] [address]  CBC     Status: Abnormal   Collection Time: 04/11/24  3:09 AM  Result Value Ref Range   WBC 10.9 (H) 4.0 - 10.5 K/uL   RBC 4.23 3.87 - 5.11 MIL/uL   Hemoglobin 12.2 12.0 - 15.0 g/dL    HCT  36.2 36.0 - 46.0 %   MCV 85.6 80.0 - 100.0 fL   MCH 28.8 26.0 - 34.0 pg   MCHC 33.7 30.0 - 36.0 g/dL   RDW 85.4 88.4 - 84.4 %   Platelets 529 (H) 150 - 400 K/uL   nRBC 0.0 0.0 - 0.2 %    Comment: Performed at [facility], [address] [place], [place] [address]   CT ABDOMEN PELVIS W CONTRAST Result Date: 04/10/2024 CLINICAL DATA:  Low back pain and flank pain. Pain with urination and foul odor. Fevers at home. Left hip fracture. EXAM: CT ABDOMEN AND PELVIS WITH CONTRAST TECHNIQUE: Multidetector CT imaging of the abdomen and pelvis was performed using the standard protocol following bolus administration of intravenous contrast. RADIATION DOSE REDUCTION: This exam was performed according to the departmental dose-optimization program which includes automated exposure control, adjustment of the mA and/or kV according to patient size and/or use of iterative reconstruction technique. CONTRAST:  OMNIPAQUE  IOHEXOL  300 MG/ML  SOLN COMPARISON:  CT chest abdomen pelvis 12/20/2023 FINDINGS: Lower chest: Motion artifact.  Lung bases appear grossly clear. Hepatobiliary: No focal liver abnormality is seen. No gallstones, gallbladder wall thickening, or biliary dilatation. Pancreas: Unremarkable. No pancreatic ductal dilatation or surrounding inflammatory changes. Spleen: Normal in size without focal abnormality. Adrenals/Urinary Tract: No adrenal gland nodules. Kidneys are symmetrical with symmetrical and homogeneous appearing nephrograms. No hydronephrosis or hydroureter. Limited visualization of the bladder due to under distension and streak artifact from hip prosthesis. Stomach/Bowel: Stomach, small bowel, and colon are mostly decompressed. No wall thickening or inflammatory stranding are appreciated. Sigmoid colonic diverticulosis without evidence of acute diverticulitis. Appendix is normal. Vascular/Lymphatic: Aortic atherosclerosis. No enlarged abdominal or pelvic lymph nodes. Reproductive: Limited  visualization of the pelvis due to streak artifact. No pelvic mass identified. Other: No free air or free fluid in the abdomen. Abdominal wall musculature appears intact. Musculoskeletal: Postoperative changes including posterior fixation intervertebral fusion at L4-5. Slight anterior subluxation at L3-4. Compression of the superior endplate at L1. Irregular endplates at T11-12, unchanged. Compression at L1 is progressed since the prior study possibly representing acute compression. Postoperative left hip arthroplasty. Components appear grossly well seated. Old fracture deformity of the right inferior pubic ramus. No acute pelvic fractures are identified. IMPRESSION: 1. No evidence of urinary tract obstruction. Limited visualization of the bladder due to artifact. 2. Colonic diverticulosis without evidence of acute diverticulitis. No bowel obstruction. 3. Aortic atherosclerosis. 4. Compression of the superior endplate of L1 appears progressed since prior study possibly representing acute compression. Electronically Signed   By: Elsie Gravely M.D.   On: 04/10/2024 16:11    Pending Labs Unresulted Labs (From admission, onward)     Start     Ordered   04/10/24 2030  Osmolality  Once,   AD        04/10/24 2030   04/10/24 1909  Osmolality, urine  Add-on,   AD        04/10/24 1908            Vitals/Pain Today's Vitals   04/11/24 0530 04/11/24 0600 04/11/24 0630 04/11/24 0741  BP: 111/68 121/73 (!) 163/84   Pulse: 78 82 88   Resp: 17     Temp:      TempSrc:      SpO2: 95% 97% 98%   Weight:      Height:      PainSc:    7     Isolation Precautions No active isolations  Medications Medications  scopolamine  (TRANSDERM-SCOP) 1 MG/3DAYS 1 mg (1 mg Transdermal Patch Applied 04/10/24 1452)  oxyCODONE  (Oxy IR/ROXICODONE ) immediate release tablet 15 mg (15 mg Oral Given 04/11/24 0741)  DULoxetine  (CYMBALTA ) DR capsule 60 mg (has no administration in time range)  lamoTRIgine  (LAMICTAL ) tablet  100 mg (100 mg Oral Given 04/10/24 2350)  gabapentin  (NEURONTIN ) capsule 800 mg (800 mg Oral Given 04/10/24 2350)  methocarbamol  (ROBAXIN ) tablet 500 mg (500 mg Oral Given 04/10/24 2350)  enoxaparin  (LOVENOX ) injection 40 mg (has no administration in time range)  acetaminophen  (TYLENOL ) tablet 650 mg (has no administration in time range)    Or  acetaminophen  (TYLENOL ) suppository 650 mg (has no administration in time range)  ondansetron  (ZOFRAN ) tablet 4 mg (4 mg Oral Given 04/11/24 0741)    Or  ondansetron  (ZOFRAN ) injection 4 mg ( Intravenous See Alternative 04/11/24 0741)  polyethylene glycol (MIRALAX  / GLYCOLAX ) packet 17 g (has no administration in time range)  amLODipine  (NORVASC ) tablet 2.5 mg (has no administration in time range)  ondansetron  (ZOFRAN ) injection 4 mg (4 mg Intravenous Given 04/10/24 1251)  ketorolac  (TORADOL ) injection 30 mg (30 mg Intramuscular Given 04/10/24 1317)  oxyCODONE -acetaminophen  (PERCOCET/ROXICET) 5-325 MG per tablet 1 tablet (1 tablet Oral Given 04/10/24 1323)  promethazine  (PHENERGAN ) 12.5 mg in sodium chloride  0.9 % 50 mL IVPB (0 mg Intravenous Stopped 04/10/24 1425)  sodium chloride  0.9 % bolus 500 mL (0 mLs Intravenous Stopped 04/10/24 1452)  HYDROmorphone  (DILAUDID ) injection 0.5 mg (0.5 mg Intravenous Given 04/10/24 1452)  iohexol  (OMNIPAQUE ) 300 MG/ML solution 100 mL (100 mLs Intravenous Contrast Given 04/10/24 1532)  potassium chloride  10 mEq in 100 mL IVPB (0 mEq Intravenous Stopped 04/10/24 2229)  HYDROmorphone  (DILAUDID ) injection 0.5 mg (0.5 mg Intravenous Given 04/10/24 2019)  potassium chloride  (KLOR-CON ) packet 40 mEq (40 mEq Oral Given 04/10/24 2352)    Mobility walks     Focused Assessments    R Recommendations: See Admitting Provider Note  Report given to:   Additional Notes:

## 2024-04-11 NOTE — Plan of Care (Signed)
  Problem: Acute Rehab OT Goals (only OT should resolve) Goal: Pt. Will Perform Grooming Flowsheets (Taken 04/11/2024 1207) Pt Will Perform Grooming: with modified independence Goal: Pt. Will Transfer To Toilet Flowsheets (Taken 04/11/2024 1207) Pt Will Transfer to Toilet:  with modified independence  ambulating Goal: Pt/Caregiver Will Perform Home Exercise Program Flowsheets (Taken 04/11/2024 1207) Pt/caregiver will Perform Home Exercise Program:  Increased strength  Both right and left upper extremity  Independently  Banita Lehn OT, MOT

## 2024-04-11 NOTE — Evaluation (Signed)
 Occupational Therapy Evaluation Patient Details Name: Allison Berry MRN: [id number] DOB: 21-Jun-1966 Today's Date: 04/11/2024   History of Present Illness   Allison Berry is a 58 y.o. female with medical history significant for hypertension, depression and anxiety.  Patient presented to the ED with complaints of worsening back pain since recent hospitalization, triage notes,- she also just reports some pain with urination and foul smell to her urine, she denies both of this to me.  She denies fever or chills.  No cough or difficulty breathing.  She reports a fall prior to her last hospitalization, none since.  She reports vomiting, that started yesterday, she has had about 5-6 episodes since onset.  2 episodes of loose stool also yesterday.  No abdominal pain.  Has had poor oral intake for the past 4 days.  Denies excessive free water  drinking.  Reports episodes of feeling a bit confused/short-term memory problems since she was discharged from the hospital. (per MD)     Clinical Impressions Pt agreeable to OT and PT co-evaluation. Pt reports 2 falls a month at home. Pt is living alone with PRN assistance. Pt open to home health safety assessment from occupational therapy. Pt able to complete ADL's with mod I to set up assist at this time. Unsteady in standing and ambulating with RW but not needing physical assist. B UE generally weak. No TLSO in the pt's room today. Pt left in the chair with call bell within reach. Pt will benefit from continued OT in the hospital to increase strength, balance, and endurance for safe ADL's.        If plan is discharge home, recommend the following:   A little help with walking and/or transfers;A little help with bathing/dressing/bathroom;Assistance with cooking/housework;Assist for transportation;Help with stairs or ramp for entrance     Functional Status Assessment   Patient has had a recent decline in their functional status and demonstrates the ability  to make significant improvements in function in a reasonable and predictable amount of time.     Equipment Recommendations   None recommended by OT             Precautions/Restrictions   Precautions Precautions: Fall Recall of Precautions/Restrictions: Intact Required Braces or Orthoses: Spinal Brace Spinal Brace: Thoracolumbosacral orthotic (Not yet in pt's room.) Restrictions Weight Bearing Restrictions Per Provider Order: No     Mobility Bed Mobility Overal bed mobility: Modified Independent             General bed mobility comments: HOB elevated    Transfers Overall transfer level: Needs assistance   Transfers: Sit to/from Stand, Bed to chair/wheelchair/BSC Sit to Stand: Supervision, Contact guard assist     Step pivot transfers: Supervision, Contact guard assist     General transfer comment: EOB to chair without RW      Balance Overall balance assessment: Needs assistance Sitting-balance support: No upper extremity supported, Feet supported Sitting balance-Leahy Scale: Good Sitting balance - Comments: seated in the chair   Standing balance support: Bilateral upper extremity supported, During functional activity, Reliant on assistive device for balance Standing balance-Leahy Scale: Fair Standing balance comment: fair usign RW                           ADL either performed or assessed with clinical judgement   ADL Overall ADL's : Needs assistance/impaired     Grooming: Supervision/safety;Standing;Wash/dry face Grooming Details (indicate cue type and reason): Pt standing at the sink to  wash hands without physical assist.     Lower Body Bathing: Modified independent;Set up;Sitting/lateral leans       Lower Body Dressing: Modified independent;Set up;Sitting/lateral leans Lower Body Dressing Details (indicate cue type and reason): Able to don socks seated in the chair. Toilet Transfer: Supervision/safety;Ambulation;Rolling walker  (2 wheels) Toilet Transfer Details (indicate cue type and reason): Chair to toilet with RW.         Functional mobility during ADLs: Supervision/safety;Rolling walker (2 wheels) General ADL Comments: Mildly unsteady with use of RW. Not needing physical assist.     Vision Baseline Vision/History: 1 Wears glasses Vision Assessment?: No apparent visual deficits     Perception Perception: Not tested       Praxis Praxis: Not tested       Pertinent Vitals/Pain Pain Assessment Pain Assessment: 0-10 Pain Score: 6  Pain Location: back Pain Descriptors / Indicators: Constant, Other (Comment) (intense) Pain Intervention(s): Limited activity within patient's tolerance, Monitored during session, Repositioned     Extremity/Trunk Assessment Upper Extremity Assessment Upper Extremity Assessment: Generalized weakness   Lower Extremity Assessment Lower Extremity Assessment: Defer to PT evaluation   Cervical / Trunk Assessment Cervical / Trunk Assessment: Kyphotic   Communication Communication Communication: No apparent difficulties   Cognition Arousal: Alert Behavior During Therapy: WFL for tasks assessed/performed Cognition: No apparent impairments                               Following commands: Intact       Cueing  General Comments   Cueing Techniques: Verbal cues                 Home Living Family/patient expects to be discharged to:: Private residence Living Arrangements: Alone Available Help at Discharge: Family;Available PRN/intermittently Type of Home: House Home Access: Stairs to enter [employer] of Steps: 4 Entrance Stairs-Rails: Right;Left Home Layout: One level     Bathroom Shower/Tub: Chief Strategy Officer: Standard Bathroom Accessibility: Yes   Home Equipment: Agricultural consultant (2 wheels);Cane - single point;Shower seat;Toilet riser;BSC/3in1   Additional Comments: Pt reports same living situation as prior  admission.      Prior Functioning/Environment Prior Level of Function : Independent/Modified Independent;History of Falls (last six months) (Pt reports having 2 falls a month.)             Mobility Comments: Pt uses RW in the home. Pt reports she is mostly staying inside now. ADLs Comments: Independent ADL's; oders groceries; PRN assist for cleaning.    OT Problem List: Decreased strength;Decreased activity tolerance;Impaired balance (sitting and/or standing)   OT Treatment/Interventions: Self-care/ADL training;Therapeutic exercise;Therapeutic activities;Patient/family education;Balance training      OT Goals(Current goals can be found in the care plan section)   Acute Rehab OT Goals Patient Stated Goal: return home OT Goal Formulation: With patient Time For Goal Achievement: 04/25/24 Potential to Achieve Goals: Good   OT Frequency:  Min 1X/week    Co-evaluation PT/OT/SLP Co-Evaluation/Treatment: Yes Reason for Co-Treatment: To address functional/ADL transfers   OT goals addressed during session: ADL's and self-care      AM-PAC OT 6 Clicks Daily Activity     Outcome Measure Help from another person eating meals?: None Help from another person taking care of personal grooming?: A Little Help from another person toileting, which includes using toliet, bedpan, or urinal?: None Help from another person bathing (including washing, rinsing, drying)?: None Help from another person to  put on and taking off regular upper body clothing?: None Help from another person to put on and taking off regular lower body clothing?: None 6 Click Score: 23   End of Session Equipment Utilized During Treatment: Rolling walker (2 wheels)  Activity Tolerance: Patient tolerated treatment well Patient left: in chair;with call bell/phone within reach  OT Visit Diagnosis: Unsteadiness on feet (R26.81);Other abnormalities of gait and mobility (R26.89);Repeated falls (R29.6);History of falling  (Z91.81)                Time: 9070-9052 OT Time Calculation (min): 18 min Charges:  OT General Charges $OT Visit: 1 Visit OT Evaluation $OT Eval Low Complexity: 1 Low  Avari Nevares OT, MOT   Jayson Person 04/11/2024, 12:05 PM

## 2024-04-11 NOTE — ED Notes (Signed)
 Pt given ice water  and brief per request.

## 2024-04-11 NOTE — Evaluation (Signed)
 Physical Therapy Evaluation Patient Details Name: Allison Berry MRN: [id number] DOB: [birth year] Today's Date: 04/11/2024  History of Present Illness  Allison Berry is a 58 y.o. female with medical history significant for hypertension, depression and anxiety.  Patient presented to the ED with complaints of worsening back pain since recent hospitalization, triage notes,- she also just reports some pain with urination and foul smell to her urine, she denies both of this to me.  She denies fever or chills.  No cough or difficulty breathing.  She reports a fall prior to her last hospitalization, none since.  She reports vomiting, that started yesterday, she has had about 5-6 episodes since onset.  2 episodes of loose stool also yesterday.  No abdominal pain.  Has had poor oral intake for the past 4 days.  Denies excessive free water  drinking.  Reports episodes of feeling a bit confused/short-term memory problems since she was discharged from the hospital.   Clinical Impression  Patient requires HOB partially raised for sitting up at bedside, has to lean on armrest of chair during transfers without AD, safer using RW with good return for ambulating to bath room and transferring to/from commode. Patient toleraed ambulating in room/hallway without loss of balance, but limited mostly due to mild increase in low back pain. Patient tolerated sitting up in chair after therapy. Patient will benefit from continued skilled physical therapy in hospital and recommended venue below to increase strength, balance, endurance for safe ADLs and gait.        If plan is discharge home, recommend the following: A little help with walking and/or transfers;A little help with bathing/dressing/bathroom;Help with stairs or ramp for entrance;Assistance with cooking/housework   Can travel by private vehicle        Equipment Recommendations None recommended by PT  Recommendations for Other Services       Functional Status  Assessment Patient has had a recent decline in their functional status and demonstrates the ability to make significant improvements in function in a reasonable and predictable amount of time.     Precautions / Restrictions Precautions Precautions: Fall Recall of Precautions/Restrictions: Intact Required Braces or Orthoses: Spinal Brace Spinal Brace: Thoracolumbosacral orthotic Restrictions Weight Bearing Restrictions Per Provider Order: No      Mobility  Bed Mobility Overal bed mobility: Modified Independent             General bed mobility comments: HOB elevated    Transfers Overall transfer level: Needs assistance   Transfers: Sit to/from Stand, Bed to chair/wheelchair/BSC Sit to Stand: Supervision, Contact guard assist   Step pivot transfers: Supervision, Contact guard assist       General transfer comment: labored movement having to lean on armrest of chair during transfer without AD, safer using RW    Ambulation/Gait Ambulation/Gait assistance: Supervision, Contact guard assist Gait Distance (Feet): 45 Feet Assistive device: Rolling walker (2 wheels) Gait Pattern/deviations: Decreased step length - right, Decreased step length - left, Decreased stride length, Trunk flexed Gait velocity: decreased     General Gait Details: slow labored movement without loss of balance, limited mostly due to fatigue  Stairs            Wheelchair Mobility     Tilt Bed    Modified Rankin (Stroke Patients Only)       Balance Overall balance assessment: Needs assistance Sitting-balance support: Feet supported, No upper extremity supported Sitting balance-Leahy Scale: Good Sitting balance - Comments: seated at EOB   Standing balance support: During functional  activity, No upper extremity supported Standing balance-Leahy Scale: Poor Standing balance comment: fair/good using RW                             Pertinent Vitals/Pain Pain Assessment Pain  Assessment: 0-10 Pain Score: 6  Pain Location: back Pain Descriptors / Indicators: Constant, Other (Comment), Discomfort, Sore Pain Intervention(s): Limited activity within patient's tolerance, Monitored during session, Repositioned    Home Living Family/patient expects to be discharged to:: Private residence Living Arrangements: Alone Available Help at Discharge: Family;Available PRN/intermittently Type of Home: House Home Access: Stairs to enter Entrance Stairs-Rails: Doctor, general practice of Steps: 4   Home Layout: One level Home Equipment: Agricultural consultant (2 wheels);Cane - single point;Shower seat;Toilet riser;BSC/3in1 Additional Comments: Pt reports same living situation as prior admission.    Prior Function Prior Level of Function : Independent/Modified Independent;History of Falls (last six months)             Mobility Comments: household ambulation using RW ADLs Comments: Independent ADL's; oders groceries; PRN assist for cleaning.     Extremity/Trunk Assessment   Upper Extremity Assessment Upper Extremity Assessment: Defer to OT evaluation    Lower Extremity Assessment Lower Extremity Assessment: Generalized weakness    Cervical / Trunk Assessment Cervical / Trunk Assessment: Kyphotic  Communication   Communication Communication: No apparent difficulties    Cognition Arousal: Alert Behavior During Therapy: WFL for tasks assessed/performed   PT - Cognitive impairments: No apparent impairments                         Following commands: Intact       Cueing Cueing Techniques: Verbal cues     General Comments      Exercises     Assessment/Plan    PT Assessment Patient needs continued PT services  PT Problem List Decreased strength;Decreased activity tolerance;Decreased balance;Decreased mobility       PT Treatment Interventions DME instruction;Gait training;Stair training;Functional mobility training;Therapeutic  activities;Therapeutic exercise;Balance training;Patient/family education    PT Goals (Current goals can be found in the Care Plan section)  Acute Rehab PT Goals Patient Stated Goal: return home with family to assist PT Goal Formulation: With patient Time For Goal Achievement: 04/14/24 Potential to Achieve Goals: Good    Frequency Min 3X/week     Co-evaluation PT/OT/SLP Co-Evaluation/Treatment: Yes Reason for Co-Treatment: To address functional/ADL transfers PT goals addressed during session: Mobility/safety with mobility;Balance;Proper use of DME OT goals addressed during session: ADL's and self-care       AM-PAC PT 6 Clicks Mobility  Outcome Measure Help needed turning from your back to your side while in a flat bed without using bedrails?: None Help needed moving from lying on your back to sitting on the side of a flat bed without using bedrails?: A Little Help needed moving to and from a bed to a chair (including a wheelchair)?: A Little Help needed standing up from a chair using your arms (e.g., wheelchair or bedside chair)?: A Little Help needed to walk in hospital room?: A Little Help needed climbing 3-5 steps with a railing? : A Lot 6 Click Score: 18    End of Session   Activity Tolerance: Patient tolerated treatment well;Patient limited by fatigue Patient left: in chair;with call bell/phone within reach Nurse Communication: Mobility status PT Visit Diagnosis: Unsteadiness on feet (R26.81);Other abnormalities of gait and mobility (R26.89);Muscle weakness (generalized) (M62.81)    Time:  9071-9051 PT Time Calculation (min) (ACUTE ONLY): 20 min   Charges:   PT Evaluation $PT Eval Moderate Complexity: 1 Mod PT Treatments $Therapeutic Activity: 8-22 mins PT General Charges $$ ACUTE PT VISIT: 1 Visit         2:54 PM, 04/11/24 Lynwood Music, MPT Physical Therapist with [facility] [phone] office [phone] mobile phone

## 2024-04-11 NOTE — Progress Notes (Signed)
   04/11/24 1232  TOC Brief Assessment  Insurance and Status Reviewed  Patient has primary care physician Yes  Home environment has been reviewed Home  Prior level of function: Independent  Prior/Current Home Services No current home services  Social Drivers of Health Review SDOH reviewed no interventions necessary  Readmission risk has been reviewed Yes  Transition of care needs no transition of care needs at this time   Transition of Care Department [facility]) has reviewed patient and no TOC needs have been identified at this time. We will continue to monitor patient advancement through interdisciplinary progression rounds. If new patient transition needs arise, please place a TOC consult.

## 2024-04-11 NOTE — Progress Notes (Signed)
 PROGRESS NOTE    Allison Berry  FMW:[id number] DOB: [birth year] DOA: 04/10/2024 PCP: Atha Repress, DO   Brief Narrative:    Allison Berry is a 58 y.o. female with medical history significant for hypertension, depression and anxiety. Patient presented to the ED with complaints of worsening back pain since recent hospitalization and is also noted to have some loose stools and vomiting.  She has also had poor oral intake for the last 4 days.  She was admitted with hyponatremia and hypokalemia in the setting of dehydration and is also noted to have L1 compression fracture with fall noted 2 weeks ago.  She now appears to be tolerating diet and PT/OT evaluation pending.  Assessment & Plan:   Principal Problem:   Hyponatremia  Assessment and Plan:   Electrolyte abnormalities/dehydration- hyponatremia-121-baseline mid 130s.  Reports 4 days of poor oral intake, vomiting onset last night, and 2 episodes of diarrhea. On HCTZ and Cymbalta . Dry mucous membranes. Benign abdomen.  Afebrile.  WBC 11.7.  CTAP WC- no acute intra-abdominal abnormality.  UA not suggestive of UTI. - Hold further IV fluid -Patient now slightly hyperkalemic, hold further supplementation - Urine and serum osmolality - Urine sodium- 60 - TSH - Hold HCTZ and Cymbalta  -Overall improved, continue to monitor with repeat labs in a.m.   L1 compression fracture- reports fall 2 weeks ago, prior to recent hospitalization.  CT AP - compression of the superior endplate of L1 appears progressed since prior study possibly representing acute compression. -Needs to follow-up as outpatient - TLSO brace - Pain control with opiates, muscle relaxants  - Resume gabapentin  -PT/OT evaluation   Hypertension-stable. - Holding lisinopril  10/HCTZ 12.5 with hyponatremia, to allow for hydration and with contrast exposure. -Given one-time dose of amlodipine  2.5 mg   Depression/anxiety -Resume Lamictal  - Hold cymbalta  with hyponatremia    DVT  prophylaxis: Lovenox  Code Status: Full Family Communication: None at bedside Disposition Plan:  Status is: Observation The patient remains OBS appropriate and will d/c before 2 midnights.   Consultants:  None  Procedures:  None  Antimicrobials:  None   Subjective: Patient seen and evaluated today with ongoing complaints of low back pain noted.  She denies any other concerns and is tolerating diet this morning.  Sodium levels have improved.  Objective: Vitals:   04/11/24 0530 04/11/24 0600 04/11/24 0630 04/11/24 0911  BP: 111/68 121/73 (!) 163/84 118/81  Pulse: 78 82 88   Resp: 17   17  Temp:    99 F (37.2 C)  TempSrc:    Oral  SpO2: 95% 97% 98% 100%  Weight:      Height:        Intake/Output Summary (Last 24 hours) at 04/11/2024 1114 Last data filed at 04/11/2024 0741 Gross per 24 hour  Intake 1756.98 ml  Output --  Net 1756.98 ml   Filed Weights   04/10/24 1127  Weight: 52.2 kg    Examination:  General exam: Appears calm and comfortable  Respiratory system: Clear to auscultation. Respiratory effort normal. Cardiovascular system: S1 & S2 heard, RRR.  Gastrointestinal system: Abdomen is soft Central nervous system: Alert and awake Extremities: No edema Skin: No significant lesions noted Psychiatry: Flat affect.    Data Reviewed: I have personally reviewed following labs and imaging studies  CBC: Recent Labs  Lab 04/10/24 1242 04/11/24 0309  WBC 11.7* 10.9*  HGB 12.7 12.2  HCT 36.8 36.2  MCV 82.1 85.6  PLT 625* 529*   Basic Metabolic Panel: Recent Labs  Lab 04/10/24 1242 04/11/24 0309  NA 121* 128*  K 2.8* 5.2*  CL 87* 98  CO2 19* 20*  GLUCOSE 110* 95  BUN 6 8  CREATININE 0.47 0.49  CALCIUM  8.9 9.3  MG 1.6*  --    GFR: Estimated Creatinine Clearance: 63.9 mL/min (by C-G formula based on SCr of 0.49 mg/dL). Liver Function Tests: Recent Labs  Lab 04/10/24 1242  AST 18  ALT 11  ALKPHOS 105  BILITOT 0.7  PROT 8.0  ALBUMIN  4.1    Recent Labs  Lab 04/10/24 1242  LIPASE 49   No results for input(s): AMMONIA in the last 168 hours. Coagulation Profile: No results for input(s): INR, PROTIME in the last 168 hours. Cardiac Enzymes: No results for input(s): CKTOTAL, CKMB, CKMBINDEX, TROPONINI in the last 168 hours. BNP (last 3 results) No results for input(s): PROBNP in the last 8760 hours. HbA1C: No results for input(s): HGBA1C in the last 72 hours. CBG: No results for input(s): GLUCAP in the last 168 hours. Lipid Profile: No results for input(s): CHOL, HDL, LDLCALC, TRIG, CHOLHDL, LDLDIRECT in the last 72 hours. Thyroid Function Tests: No results for input(s): TSH, T4TOTAL, FREET4, T3FREE, THYROIDAB in the last 72 hours. Anemia Panel: No results for input(s): VITAMINB12, FOLATE, FERRITIN, TIBC, IRON , RETICCTPCT in the last 72 hours. Sepsis Labs: No results for input(s): PROCALCITON, LATICACIDVEN in the last 168 hours.  No results found for this or any previous visit (from the past 240 hours).       Radiology Studies: CT ABDOMEN PELVIS W CONTRAST Result Date: 04/10/2024 CLINICAL DATA:  Low back pain and flank pain. Pain with urination and foul odor. Fevers at home. Left hip fracture. EXAM: CT ABDOMEN AND PELVIS WITH CONTRAST TECHNIQUE: Multidetector CT imaging of the abdomen and pelvis was performed using the standard protocol following bolus administration of intravenous contrast. RADIATION DOSE REDUCTION: This exam was performed according to the departmental dose-optimization program which includes automated exposure control, adjustment of the mA and/or kV according to patient size and/or use of iterative reconstruction technique. CONTRAST:  OMNIPAQUE  IOHEXOL  300 MG/ML  SOLN COMPARISON:  CT chest abdomen pelvis 12/20/2023 FINDINGS: Lower chest: Motion artifact.  Lung bases appear grossly clear. Hepatobiliary: No focal liver abnormality is seen. No  gallstones, gallbladder wall thickening, or biliary dilatation. Pancreas: Unremarkable. No pancreatic ductal dilatation or surrounding inflammatory changes. Spleen: Normal in size without focal abnormality. Adrenals/Urinary Tract: No adrenal gland nodules. Kidneys are symmetrical with symmetrical and homogeneous appearing nephrograms. No hydronephrosis or hydroureter. Limited visualization of the bladder due to under distension and streak artifact from hip prosthesis. Stomach/Bowel: Stomach, small bowel, and colon are mostly decompressed. No wall thickening or inflammatory stranding are appreciated. Sigmoid colonic diverticulosis without evidence of acute diverticulitis. Appendix is normal. Vascular/Lymphatic: Aortic atherosclerosis. No enlarged abdominal or pelvic lymph nodes. Reproductive: Limited visualization of the pelvis due to streak artifact. No pelvic mass identified. Other: No free air or free fluid in the abdomen. Abdominal wall musculature appears intact. Musculoskeletal: Postoperative changes including posterior fixation intervertebral fusion at L4-5. Slight anterior subluxation at L3-4. Compression of the superior endplate at L1. Irregular endplates at T11-12, unchanged. Compression at L1 is progressed since the prior study possibly representing acute compression. Postoperative left hip arthroplasty. Components appear grossly well seated. Old fracture deformity of the right inferior pubic ramus. No acute pelvic fractures are identified. IMPRESSION: 1. No evidence of urinary tract obstruction. Limited visualization of the bladder due to artifact. 2. Colonic diverticulosis without evidence of  acute diverticulitis. No bowel obstruction. 3. Aortic atherosclerosis. 4. Compression of the superior endplate of L1 appears progressed since prior study possibly representing acute compression. Electronically Signed   By: Elsie Gravely M.D.   On: 04/10/2024 16:11        Scheduled Meds:  amLODipine   2.5  mg Oral Daily   DULoxetine   60 mg Oral Daily   enoxaparin  (LOVENOX ) injection  40 mg Subcutaneous Q24H   gabapentin   800 mg Oral TID   lamoTRIgine   100 mg Oral QHS   methocarbamol   500 mg Oral TID   scopolamine   1 patch Transdermal Q72H     LOS: 0 days    Time spent: 55 minutes    Azarion Hove JONETTA Fairly, DO [facility]  If 7PM-7AM, please contact night-coverage [email or web address] 04/11/2024, 11:14 AM

## 2024-04-12 DIAGNOSIS — E871 Hypo-osmolality and hyponatremia: Secondary | ICD-10-CM | POA: Diagnosis not present

## 2024-04-12 LAB — BASIC METABOLIC PANEL WITH GFR
Anion gap: 7 (ref 5–15)
BUN: 18 mg/dL (ref 6–20)
CO2: 25 mmol/L (ref 22–32)
Calcium: 8.7 mg/dL — ABNORMAL LOW (ref 8.9–10.3)
Chloride: 100 mmol/L (ref 98–111)
Creatinine, Ser: 0.52 mg/dL (ref 0.44–1.00)
GFR, Estimated: >60 mL/min (ref >60)
Glucose, Bld: 90 mg/dL (ref 70–99)
Potassium: 4.7 mmol/L (ref 3.5–5.1)
Sodium: 132 mmol/L — ABNORMAL LOW (ref 135–145)

## 2024-04-12 LAB — CBC
HCT: 32.7 % — ABNORMAL LOW (ref 36.0–46.0)
Hemoglobin: 10.4 g/dL — ABNORMAL LOW (ref 12.0–15.0)
MCH: 28.3 pg (ref 26.0–34.0)
MCHC: 31.8 g/dL (ref 30.0–36.0)
MCV: 88.9 fL (ref 80.0–100.0)
Platelets: 465 K/uL — ABNORMAL HIGH (ref 150–400)
RBC: 3.68 MIL/uL — ABNORMAL LOW (ref 3.87–5.11)
RDW: 14.9 % (ref 11.5–15.5)
WBC: 7.3 K/uL (ref 4.0–10.5)
nRBC: 0 % (ref 0.0–0.2)

## 2024-04-12 LAB — MAGNESIUM: Magnesium: 2.1 mg/dL (ref 1.7–2.4)

## 2024-04-12 MED ORDER — ONDANSETRON HCL 4 MG PO TABS: 4.0000 mg | ORAL_TABLET | Freq: Four times a day (QID) | ORAL | 0 refills | Status: AC | PRN

## 2024-04-12 MED ORDER — METHOCARBAMOL 500 MG PO TABS
500.0000 mg | ORAL_TABLET | Freq: Three times a day (TID) | ORAL | Status: DC
  Administered 2024-04-12: 500 mg via ORAL
  Filled 2024-04-12: qty 1

## 2024-04-12 MED ORDER — METHOCARBAMOL 500 MG PO TABS: 500.0000 mg | ORAL_TABLET | Freq: Three times a day (TID) | ORAL | 0 refills | Status: AC | PRN

## 2024-04-12 MED ORDER — OXYCODONE HCL 15 MG PO TABS: 15.0000 mg | ORAL_TABLET | Freq: Four times a day (QID) | ORAL | 0 refills | Status: AC | PRN

## 2024-04-12 NOTE — TOC Transition Note (Signed)
 Transition of Care [facility]) - Discharge Note   Patient Details  Name: Allison Berry MRN: [id number] Date of Birth: Feb 23, 1966  Transition of [facility]) CM/SW Contact:  Noreen KATHEE Pinal, LCSWA Phone Number: 04/12/2024, 10:29 AM   Clinical Narrative:     CSW spoke with patient at bedside regarding OPPT since no HH agency accepts her insurance. CSW did beg numerous agencies and all said no. Patient shared that she does not have transportation for OPPT. Patient understood that CSW was unable to arrange [facility]. CSW did add resources for patient regarding traditional medicaid and looking into Medicare since she has disability. CSW signing off.   Final next level of care: Home/Self Care Barriers to Discharge: Barriers Resolved   Patient Goals and CMS Choice Patient states their goals for this hospitalization and ongoing recovery are:: DC home CMS Medicare.gov Compare Post Acute Care list provided to:: Patient        Discharge Placement                Patient to be transferred to facility by: POV Name of family member notified: Maggi Patient and family notified of of transfer: 04/12/24  Discharge Plan and Services Additional resources added to the After Visit Summary for                                       Social Drivers of Health (SDOH) Interventions SDOH Screenings   Food Insecurity: Food Insecurity Present (04/11/2024)  Housing: Low Risk  (04/11/2024)  Transportation Needs: Unmet Transportation Needs (04/11/2024)  Utilities: Not At Risk (04/11/2024)  Financial Resource Strain: Low Risk  (06/07/2022)   Received from [facility]  Physical Activity: Unknown (10/06/2021)   Received from [facility]  Social Connections: Somewhat Isolated (10/06/2021)   Received from [facility]  Stress: Stress Concern Present (06/07/2022)   Received from [facility]  Tobacco Use: High Risk (04/10/2024)     Readmission Risk Interventions    03/01/2024   10:34 AM  Readmission  Risk Prevention Plan  Post Dischage Appt Complete  Medication Screening Complete  Transportation Screening Complete

## 2024-04-12 NOTE — Discharge Summary (Signed)
 Physician Discharge Summary  Allison Berry FMW:[id number] DOB: March 13, 1966 DOA: 04/10/2024  PCP: Atha Repress, DO  Admit date: 04/10/2024  Discharge date: 04/12/2024  Admitted From:Home  Disposition:  Home  Recommendations for Outpatient Follow-up:  Follow up with PCP in 1-2 weeks Follow-up with orthopedics Dr. Eva as recommended regarding right knee arthritis with limited extension Continue pain medications as ordered for pain control in the setting of compression fracture Hold HCTZ/lisinopril  given soft blood pressure readings and noted hyponatremia Okay to continue Cymbalta  for now and follow-up BMP in 1 week to ensure sodium levels and potassium levels remain stable  Home Health: Trying to arrange home health PT/OT versus outpatient  Equipment/Devices: TLSO brace  Discharge Condition:Stable  CODE STATUS: Full  Diet recommendation: Heart Healthy  Brief/Interim Summary: Allison Berry is a 58 y.o. female with medical history significant for hypertension, depression and anxiety. Patient presented to the ED with complaints of worsening back pain since recent hospitalization and is also noted to have some loose stools and vomiting.  She has also had poor oral intake for the last 4 days.  She was admitted with hyponatremia and hypokalemia in the setting of dehydration and is also noted to have L1 compression fracture with fall noted 2 weeks ago.  She now appears to be tolerating diet and PT/OT evaluation is recommending home health services.  She is noted to have improvement in her hyponatremia and hypokalemia and is tolerating diet with no further issues.  She does continue to complain of some back pain and TLSO brace will be provided as well as limited pain medications.  No other acute events or concerns noted throughout the course of the stay.  Discharge Diagnoses:  Principal Problem:   Hyponatremia  Principal discharge diagnoses: Hyponatremia/hypokalemia in the setting of acute  gastroenteritis/HCTZ use.  Recent falls with L1 compression fracture.  Discharge Instructions  Discharge Instructions     Diet - low sodium heart healthy   Complete by: As directed    Increase activity slowly   Complete by: As directed       Allergies as of 04/12/2024   No Known Allergies      Medication List     STOP taking these medications    lisinopril  10 MG tablet Commonly known as: ZESTRIL    lisinopril -hydrochlorothiazide  10-12.5 MG tablet Commonly known as: ZESTORETIC        TAKE these medications    alendronate 70 MG tablet Commonly known as: FOSAMAX Take 70 mg by mouth once a week.   clonazePAM  1 MG tablet Commonly known as: KLONOPIN  Take 1 mg by mouth 2 (two) times daily as needed for anxiety.   diclofenac  Sodium 1 % Gel Commonly known as: VOLTAREN  Apply 2 g topically in the morning and at bedtime. Apply to right knee   docusate sodium  100 MG capsule Commonly known as: COLACE Take 1 capsule (100 mg total) by mouth 2 (two) times daily. What changed:  when to take this reasons to take this   DULoxetine  60 MG capsule Commonly known as: CYMBALTA  Take 60 mg by mouth daily.   feeding supplement Liqd Take 237 mLs by mouth 2 (two) times daily between meals.   gabapentin  800 MG tablet Commonly known as: NEURONTIN  Take 800 mg by mouth 3 (three) times daily.   ibuprofen 200 MG tablet Commonly known as: ADVIL Take 200 mg by mouth every 8 (eight) hours as needed for mild pain (pain score 1-3), headache or fever.   lamoTRIgine  100 MG tablet Commonly known  as: LAMICTAL  Take 100 mg by mouth at bedtime.   methocarbamol  500 MG tablet Commonly known as: ROBAXIN  Take 1 tablet (500 mg total) by mouth every 8 (eight) hours as needed for muscle spasms.   naloxone 4 MG/0.1ML Liqd nasal spray kit Commonly known as: NARCAN Place 0.4 mg into the nose once.   ondansetron  4 MG tablet Commonly known as: ZOFRAN  Take 1 tablet (4 mg total) by mouth every 6  (six) hours as needed for nausea.   oxyCODONE  15 MG immediate release tablet Commonly known as: ROXICODONE  Take 1 tablet (15 mg total) by mouth 4 (four) times daily as needed for pain.        Follow-up Information     Atha Repress, DO. Schedule an appointment as soon as possible for a visit in 1 week(s).   Specialty: Family Medicine        Eva Fallow, MD. Schedule an appointment as soon as possible for a visit.   Specialty: Orthopedic Surgery Contact information: [address] [place] [address] [phone]                No Known Allergies  Consultations: None   Procedures/Studies: CT ABDOMEN PELVIS W CONTRAST Result Date: 04/10/2024 CLINICAL DATA:  Low back pain and flank pain. Pain with urination and foul odor. Fevers at home. Left hip fracture. EXAM: CT ABDOMEN AND PELVIS WITH CONTRAST TECHNIQUE: Multidetector CT imaging of the abdomen and pelvis was performed using the standard protocol following bolus administration of intravenous contrast. RADIATION DOSE REDUCTION: This exam was performed according to the departmental dose-optimization program which includes automated exposure control, adjustment of the mA and/or kV according to patient size and/or use of iterative reconstruction technique. CONTRAST:  OMNIPAQUE  IOHEXOL  300 MG/ML  SOLN COMPARISON:  CT chest abdomen pelvis 12/20/2023 FINDINGS: Lower chest: Motion artifact.  Lung bases appear grossly clear. Hepatobiliary: No focal liver abnormality is seen. No gallstones, gallbladder wall thickening, or biliary dilatation. Pancreas: Unremarkable. No pancreatic ductal dilatation or surrounding inflammatory changes. Spleen: Normal in size without focal abnormality. Adrenals/Urinary Tract: No adrenal gland nodules. Kidneys are symmetrical with symmetrical and homogeneous appearing nephrograms. No hydronephrosis or hydroureter. Limited visualization of the bladder due to under distension  and streak artifact from hip prosthesis. Stomach/Bowel: Stomach, small bowel, and colon are mostly decompressed. No wall thickening or inflammatory stranding are appreciated. Sigmoid colonic diverticulosis without evidence of acute diverticulitis. Appendix is normal. Vascular/Lymphatic: Aortic atherosclerosis. No enlarged abdominal or pelvic lymph nodes. Reproductive: Limited visualization of the pelvis due to streak artifact. No pelvic mass identified. Other: No free air or free fluid in the abdomen. Abdominal wall musculature appears intact. Musculoskeletal: Postoperative changes including posterior fixation intervertebral fusion at L4-5. Slight anterior subluxation at L3-4. Compression of the superior endplate at L1. Irregular endplates at T11-12, unchanged. Compression at L1 is progressed since the prior study possibly representing acute compression. Postoperative left hip arthroplasty. Components appear grossly well seated. Old fracture deformity of the right inferior pubic ramus. No acute pelvic fractures are identified. IMPRESSION: 1. No evidence of urinary tract obstruction. Limited visualization of the bladder due to artifact. 2. Colonic diverticulosis without evidence of acute diverticulitis. No bowel obstruction. 3. Aortic atherosclerosis. 4. Compression of the superior endplate of L1 appears progressed since prior study possibly representing acute compression. Electronically Signed   By: Fallow Gravely M.D.   On: 04/10/2024 16:11     Discharge Exam: Vitals:   04/11/24 2038 04/12/24 0212  BP: 109/70 115/79  Pulse: 87   Resp:  20  Temp: 98.4 F (36.9 C) 97.9 F (36.6 C)  SpO2: 100% 97%   Vitals:   04/11/24 1317 04/11/24 1648 04/11/24 2038 04/12/24 0212  BP: 119/72 117/78 109/70 115/79  Pulse: 96 96 87   Resp: 18   20  Temp: 98.5 F (36.9 C) 98.6 F (37 C) 98.4 F (36.9 C) 97.9 F (36.6 C)  TempSrc: Oral Oral Oral Oral  SpO2: 97% 100% 100% 97%  Weight:      Height:         General: Pt is alert, awake, not in acute distress Cardiovascular: RRR, S1/S2 +, no rubs, no gallops Respiratory: CTA bilaterally, no wheezing, no rhonchi Abdominal: Soft, NT, ND, bowel sounds + Extremities: no edema, no cyanosis    The results of significant diagnostics from this hospitalization (including imaging, microbiology, ancillary and laboratory) are listed below for reference.     Microbiology: No results found for this or any previous visit (from the past 240 hours).   Labs: BNP (last 3 results) No results for input(s): BNP in the last 8760 hours. Basic Metabolic Panel: Recent Labs  Lab 04/10/24 1242 04/11/24 0309 04/12/24 0344  NA 121* 128* 132*  K 2.8* 5.2* 4.7  CL 87* 98 100  CO2 19* 20* 25  GLUCOSE 110* 95 90  BUN 6 8 18   CREATININE 0.47 0.49 0.52  CALCIUM  8.9 9.3 8.7*  MG 1.6*  --  2.1   Liver Function Tests: Recent Labs  Lab 04/10/24 1242  AST 18  ALT 11  ALKPHOS 105  BILITOT 0.7  PROT 8.0  ALBUMIN  4.1   Recent Labs  Lab 04/10/24 1242  LIPASE 49   No results for input(s): AMMONIA in the last 168 hours. CBC: Recent Labs  Lab 04/10/24 1242 04/11/24 0309 04/12/24 0344  WBC 11.7* 10.9* 7.3  HGB 12.7 12.2 10.4*  HCT 36.8 36.2 32.7*  MCV 82.1 85.6 88.9  PLT 625* 529* 465*   Cardiac Enzymes: No results for input(s): CKTOTAL, CKMB, CKMBINDEX, TROPONINI in the last 168 hours. BNP: Invalid input(s): POCBNP CBG: No results for input(s): GLUCAP in the last 168 hours. D-Dimer No results for input(s): DDIMER in the last 72 hours. Hgb A1c No results for input(s): HGBA1C in the last 72 hours. Lipid Profile No results for input(s): CHOL, HDL, LDLCALC, TRIG, CHOLHDL, LDLDIRECT in the last 72 hours. Thyroid function studies No results for input(s): TSH, T4TOTAL, T3FREE, THYROIDAB in the last 72 hours.  Invalid input(s): FREET3 Anemia work up No results for input(s): VITAMINB12, FOLATE,  FERRITIN, TIBC, IRON , RETICCTPCT in the last 72 hours. Urinalysis    Component Value Date/Time   COLORURINE STRAW (A) 04/10/2024 1245   APPEARANCEUR CLEAR 04/10/2024 1245   LABSPEC 1.008 04/10/2024 1245   PHURINE 6.0 04/10/2024 1245   GLUCOSEU NEGATIVE 04/10/2024 1245   HGBUR NEGATIVE 04/10/2024 1245   BILIRUBINUR NEGATIVE 04/10/2024 1245   KETONESUR NEGATIVE 04/10/2024 1245   PROTEINUR 100 (A) 04/10/2024 1245   NITRITE NEGATIVE 04/10/2024 1245   LEUKOCYTESUR NEGATIVE 04/10/2024 1245   Sepsis Labs Recent Labs  Lab 04/10/24 1242 04/11/24 0309 04/12/24 0344  WBC 11.7* 10.9* 7.3   Microbiology No results found for this or any previous visit (from the past 240 hours).   Time coordinating discharge: 35 minutes  SIGNED:   Adron JONETTA Fairly, DO [facility] 04/12/2024, 10:10 AM  If 7PM-7AM, please contact night-coverage [email or web address]

## 2024-05-18 ENCOUNTER — Emergency Department (HOSPITAL_COMMUNITY)
Admission: EM | Admit: 2024-05-18 | Discharge: 2024-05-18 | Disposition: A | Discharge status: Home / Self Care | Attending: Emergency Medicine | Admitting: Emergency Medicine

## 2024-05-18 ENCOUNTER — Emergency Department (HOSPITAL_COMMUNITY)

## 2024-05-18 ENCOUNTER — Other Ambulatory Visit: Payer: Self-pay

## 2024-05-18 ENCOUNTER — Encounter (HOSPITAL_COMMUNITY): Payer: Self-pay

## 2024-05-18 DIAGNOSIS — Z79899 Other long term (current) drug therapy: Secondary | ICD-10-CM | POA: Diagnosis not present

## 2024-05-18 DIAGNOSIS — M545 Low back pain, unspecified: Secondary | ICD-10-CM | POA: Insufficient documentation

## 2024-05-18 DIAGNOSIS — I160 Hypertensive urgency: Secondary | ICD-10-CM

## 2024-05-18 DIAGNOSIS — G8929 Other chronic pain: Secondary | ICD-10-CM | POA: Diagnosis not present

## 2024-05-18 DIAGNOSIS — I10 Essential (primary) hypertension: Secondary | ICD-10-CM | POA: Insufficient documentation

## 2024-05-18 LAB — BASIC METABOLIC PANEL WITH GFR
Anion gap: 12 (ref 5–15)
BUN: 9 mg/dL (ref 6–20)
CO2: 26 mmol/L (ref 22–32)
Calcium: 9.3 mg/dL (ref 8.9–10.3)
Chloride: 91 mmol/L — ABNORMAL LOW (ref 98–111)
Creatinine, Ser: 0.75 mg/dL (ref 0.44–1.00)
GFR, Estimated: >60 mL/min (ref >60)
Glucose, Bld: 102 mg/dL — ABNORMAL HIGH (ref 70–99)
Potassium: 3.5 mmol/L (ref 3.5–5.1)
Sodium: 129 mmol/L — ABNORMAL LOW (ref 135–145)

## 2024-05-18 LAB — CBC WITH DIFFERENTIAL/PLATELET
Abs Immature Granulocytes: 0.05 K/uL (ref 0.00–0.07)
Basophils Absolute: 0.1 K/uL (ref 0.0–0.1)
Basophils Relative: 1 %
Eosinophils Absolute: 0.1 K/uL (ref 0.0–0.5)
Eosinophils Relative: 1 %
HCT: 33.6 % — ABNORMAL LOW (ref 36.0–46.0)
Hemoglobin: 11.2 g/dL — ABNORMAL LOW (ref 12.0–15.0)
Immature Granulocytes: 0 %
Lymphocytes Relative: 27 %
Lymphs Abs: 3.5 K/uL (ref 0.7–4.0)
MCH: 28.4 pg (ref 26.0–34.0)
MCHC: 33.3 g/dL (ref 30.0–36.0)
MCV: 85.1 fL (ref 80.0–100.0)
Monocytes Absolute: 1.1 K/uL — ABNORMAL HIGH (ref 0.1–1.0)
Monocytes Relative: 9 %
Neutro Abs: 7.9 K/uL — ABNORMAL HIGH (ref 1.7–7.7)
Neutrophils Relative %: 62 %
Platelets: 646 K/uL — ABNORMAL HIGH (ref 150–400)
RBC: 3.95 MIL/uL (ref 3.87–5.11)
RDW: 14.6 % (ref 11.5–15.5)
WBC: 12.7 K/uL — ABNORMAL HIGH (ref 4.0–10.5)
nRBC: 0 % (ref 0.0–0.2)

## 2024-05-18 MED ORDER — KETOROLAC TROMETHAMINE 30 MG/ML IJ SOLN
30.0000 mg | Freq: Once | INTRAMUSCULAR | Status: AC
  Administered 2024-05-18: 30 mg via INTRAVENOUS
  Filled 2024-05-18: qty 1

## 2024-05-18 MED ORDER — HYDROMORPHONE HCL 1 MG/ML IJ SOLN
1.0000 mg | Freq: Once | INTRAMUSCULAR | Status: AC
  Administered 2024-05-18: 1 mg via INTRAVENOUS
  Filled 2024-05-18: qty 1

## 2024-05-18 NOTE — Discharge Instructions (Signed)
Continue medications as previously prescribed.  Follow-up with your primary doctor if symptoms are not improving in the next week.

## 2024-05-18 NOTE — ED Triage Notes (Signed)
 Complaining of high blood pressure today 170 systolic. Also complaining of lower back pain that is coming from lumbar fractures. She said she picked up a box today and aggravated them. EMS gave 50mcg of Fentanyl  with little relief.

## 2024-05-18 NOTE — ED Provider Notes (Signed)
 [place] EMERGENCY DEPARTMENT AT [facility] Provider Note   CSN: [id number] Arrival date & time: 05/18/24  0148     Patient presents with: Hypertension   Allison Berry is a 58 y.o. female.   Patient is a 58 year old female with past medical history of chronic low back pain, hypertension, hyperlipidemia.  Patient presenting with complaints of severe back pain and elevated blood pressure.  She says that 8 she lifted a box and since then her back has been hurting a lot more.  For some reason, she did not take any of the oxycodone  that she has prescribed for her specifically for this reason, but instead called 911.  She tells me that she checked her blood pressure and it was 180 systolic and this scared her as well.  She denies to me she is having any chest pain or shortness of breath.  She denies any weakness or numbness in her legs.  No bowel or bladder complaints.       Prior to Admission medications   Medication Sig Start Date End Date Taking? Authorizing Provider  alendronate (FOSAMAX) 70 MG tablet Take 70 mg by mouth once a week.    [provider]  clonazePAM  (KLONOPIN ) 1 MG tablet Take 1 mg by mouth 2 (two) times daily as needed for anxiety.    [provider]  diclofenac  Sodium (VOLTAREN ) 1 % GEL Apply 2 g topically in the morning and at bedtime. Apply to right knee 03/05/24   Arrien, Elidia Sieving, MD  docusate sodium  (COLACE) 100 MG capsule Take 1 capsule (100 mg total) by mouth 2 (two) times daily. Patient taking differently: Take 100 mg by mouth 2 (two) times daily as needed for mild constipation. 10/19/20   Nitka, James E, MD  DULoxetine  (CYMBALTA ) 60 MG capsule Take 60 mg by mouth daily.    [provider]  feeding supplement (ENSURE PLUS HIGH PROTEIN) LIQD Take 237 mLs by mouth 2 (two) times daily between meals. 03/05/24   Arrien, Mauricio Daniel, MD  gabapentin  (NEURONTIN ) 800 MG tablet Take 800 mg by mouth 3 (three) times daily.     [provider]  ibuprofen (ADVIL) 200 MG tablet Take 200 mg by mouth every 8 (eight) hours as needed for mild pain (pain score 1-3), headache or fever.    [provider]  lamoTRIgine  (LAMICTAL ) 100 MG tablet Take 100 mg by mouth at bedtime. 06/30/22   [provider]  methocarbamol  (ROBAXIN ) 500 MG tablet Take 1 tablet (500 mg total) by mouth every 8 (eight) hours as needed for muscle spasms. 04/12/24   Maree, Pratik D, DO  naloxone [facility]) nasal spray 4 mg/0.1 mL Place 0.4 mg into the nose once. 09/16/20   [provider]  ondansetron  (ZOFRAN ) 4 MG tablet Take 1 tablet (4 mg total) by mouth every 6 (six) hours as needed for nausea. 04/12/24   Maree, Pratik D, DO  oxyCODONE  (ROXICODONE ) 15 MG immediate release tablet Take 1 tablet (15 mg total) by mouth 4 (four) times daily as needed for pain. 04/12/24   Shah, Pratik D, DO    Allergies: Patient has no known allergies.    Review of Systems  All other systems reviewed and are negative.   Updated Vital Signs BP (!) 158/112 (BP Location: Right Arm)   Pulse 93   Temp 98.2 F (36.8 C) (Oral)   Resp 18   Ht 5' 6 (1.676 m)   Wt 51.7 kg   SpO2 98%  BMI 18.40 kg/m   Physical Exam Vitals and nursing note reviewed.  Constitutional:      General: She is not in acute distress.    Appearance: She is well-developed. She is not diaphoretic.  HENT:     Head: Normocephalic and atraumatic.  Cardiovascular:     Rate and Rhythm: Normal rate and regular rhythm.     Heart sounds: No murmur heard.    No friction rub. No gallop.  Pulmonary:     Effort: Pulmonary effort is normal. No respiratory distress.     Breath sounds: Normal breath sounds. No wheezing.  Abdominal:     General: Bowel sounds are normal. There is no distension.     Palpations: Abdomen is soft.     Tenderness: There is no abdominal tenderness.  Musculoskeletal:        General: Normal range of motion.     Cervical back: Normal range of motion  and neck supple.  Skin:    General: Skin is warm and dry.  Neurological:     General: No focal deficit present.     Mental Status: She is alert and oriented to person, place, and time.     (all labs ordered are listed, but only abnormal results are displayed) Labs Reviewed  BASIC METABOLIC PANEL WITH GFR  CBC WITH DIFFERENTIAL/PLATELET    EKG: EKG Interpretation Date/Time:  Friday May 18 2024 02:04:51 EDT Ventricular Rate:  92 PR Interval:  131 QRS Duration:  81 QT Interval:  362 QTC Calculation: 448 R Axis:   79  Text Interpretation: Sinus rhythm Normal ECG Confirmed by Geroldine Berg ([id number]) on 05/18/2024 2:11:13 AM  Radiology: No results found.   Procedures   Medications Ordered in the ED  HYDROmorphone  (DILAUDID ) injection 1 mg (has no administration in time range)  ketorolac  (TORADOL ) 30 MG/ML injection 30 mg (has no administration in time range)                                    Medical Decision Making Amount and/or Complexity of Data Reviewed Labs: ordered. Radiology: ordered.  Risk Prescription drug management.   Patient is a 58 year old female presenting with complaints of low back pain and elevated blood pressure.  She has chronic back pain which worsened when she bent over to pick something up, then her blood pressure was markedly elevated so comes in to be evaluated.  She arrives here with stable vital signs and is afebrile.  Initial blood pressure 158/112.  Laboratory studies obtained including CBC and basic metabolic panel.  White count is 12.7, but studies basically unremarkable otherwise.  CT scan of the lumbar spine obtained showing no acute osseous abnormality, but does show chronic degenerative changes.  Patient has been given Dilaudid  for pain and seems to be feeling somewhat better.  I see nothing emergent tonight that requires surgery or hospitalization.  Patient to be discharged with continued use of her home medications and PCP  follow-up.     Final diagnoses:  None    ED Discharge Orders     None          Geroldine Berg, MD 05/18/24 [phone]

## 2024-05-21 ENCOUNTER — Encounter: Payer: Self-pay | Admitting: Radiology

## 2024-05-26 ENCOUNTER — Other Ambulatory Visit (HOSPITAL_BASED_OUTPATIENT_CLINIC_OR_DEPARTMENT_OTHER): Payer: Self-pay | Admitting: Internal Medicine

## 2024-05-26 DIAGNOSIS — Z780 Asymptomatic menopausal state: Secondary | ICD-10-CM

## 2024-05-26 DIAGNOSIS — Z122 Encounter for screening for malignant neoplasm of respiratory organs: Secondary | ICD-10-CM

## 2025-03-21 ENCOUNTER — Other Ambulatory Visit (HOSPITAL_BASED_OUTPATIENT_CLINIC_OR_DEPARTMENT_OTHER)
